# Patient Record
Sex: Male | Born: 1975
Health system: Southern US, Community
[De-identification: ages and names within clinical notes are randomized; demographics above are authoritative.]

## PROBLEM LIST (undated history)

## (undated) DIAGNOSIS — G4733 Obstructive sleep apnea (adult) (pediatric): Secondary | ICD-10-CM

## (undated) DIAGNOSIS — J45990 Exercise induced bronchospasm: Secondary | ICD-10-CM

## (undated) DIAGNOSIS — K219 Gastro-esophageal reflux disease without esophagitis: Secondary | ICD-10-CM

## (undated) DIAGNOSIS — B2790 Infectious mononucleosis, unspecified without complication: Secondary | ICD-10-CM

## (undated) DIAGNOSIS — G8929 Other chronic pain: Secondary | ICD-10-CM

## (undated) DIAGNOSIS — G4730 Sleep apnea, unspecified: Secondary | ICD-10-CM

## (undated) DIAGNOSIS — J4540 Moderate persistent asthma, uncomplicated: Secondary | ICD-10-CM

## (undated) DIAGNOSIS — M549 Dorsalgia, unspecified: Secondary | ICD-10-CM

## (undated) DIAGNOSIS — J309 Allergic rhinitis, unspecified: Secondary | ICD-10-CM

## (undated) HISTORY — DX: Allergic rhinitis, unspecified: J30.9

## (undated) HISTORY — DX: Obstructive sleep apnea (adult) (pediatric): G47.33

## (undated) HISTORY — DX: Sleep apnea, unspecified: G47.30

## (undated) HISTORY — DX: Moderate persistent asthma, uncomplicated: J45.40

## (undated) HISTORY — DX: Dorsalgia, unspecified: M54.9

## (undated) HISTORY — DX: Other chronic pain: G89.29

## (undated) HISTORY — DX: Exercise induced bronchospasm: J45.990

## (undated) HISTORY — DX: Gastro-esophageal reflux disease without esophagitis: K21.9

## (undated) HISTORY — DX: Infectious mononucleosis, unspecified without complication: B27.90

---

## 2005-08-05 ENCOUNTER — Ambulatory Visit: Payer: Self-pay | Admitting: Internal Medicine

## 2005-08-16 ENCOUNTER — Ambulatory Visit: Payer: Self-pay | Admitting: Family Medicine

## 2005-08-29 ENCOUNTER — Ambulatory Visit: Payer: Self-pay | Admitting: Family Medicine

## 2006-08-07 ENCOUNTER — Ambulatory Visit: Payer: Self-pay | Admitting: Family Medicine

## 2006-10-16 ENCOUNTER — Ambulatory Visit: Payer: Self-pay | Admitting: Family Medicine

## 2007-06-12 ENCOUNTER — Ambulatory Visit: Payer: Self-pay | Admitting: Family Medicine

## 2007-12-13 ENCOUNTER — Ambulatory Visit: Payer: Self-pay | Admitting: Family Medicine

## 2007-12-19 ENCOUNTER — Telehealth (INDEPENDENT_AMBULATORY_CARE_PROVIDER_SITE_OTHER): Payer: Self-pay | Admitting: Internal Medicine

## 2008-01-25 ENCOUNTER — Ambulatory Visit: Payer: Self-pay | Admitting: Family Medicine

## 2008-02-19 ENCOUNTER — Emergency Department: Payer: Self-pay | Admitting: Unknown Physician Specialty

## 2008-02-21 ENCOUNTER — Ambulatory Visit: Payer: Self-pay | Admitting: Family Medicine

## 2008-02-25 ENCOUNTER — Ambulatory Visit: Payer: Self-pay | Admitting: Family Medicine

## 2008-03-05 ENCOUNTER — Ambulatory Visit: Payer: Self-pay | Admitting: Family Medicine

## 2008-03-13 ENCOUNTER — Ambulatory Visit: Payer: Self-pay | Admitting: Internal Medicine

## 2008-03-13 DIAGNOSIS — R1011 Right upper quadrant pain: Secondary | ICD-10-CM | POA: Insufficient documentation

## 2008-03-13 DIAGNOSIS — R519 Headache, unspecified: Secondary | ICD-10-CM | POA: Insufficient documentation

## 2008-03-13 DIAGNOSIS — R51 Headache: Secondary | ICD-10-CM | POA: Insufficient documentation

## 2008-03-13 LAB — CONVERTED CEMR LAB
ALT: 30 units/L (ref 0–53)
AST: 25 units/L (ref 0–37)
Alkaline Phosphatase: 75 units/L (ref 39–117)
Bilirubin, Direct: 0.1 mg/dL (ref 0.0–0.3)
CO2: 29 meq/L (ref 19–32)
Calcium: 9.3 mg/dL (ref 8.4–10.5)
Chloride: 102 meq/L (ref 96–112)
Potassium: 3.2 meq/L — ABNORMAL LOW (ref 3.5–5.1)
Sodium: 140 meq/L (ref 135–145)
Total Bilirubin: 1.1 mg/dL (ref 0.3–1.2)

## 2008-03-14 ENCOUNTER — Telehealth: Payer: Self-pay | Admitting: Family Medicine

## 2008-03-14 ENCOUNTER — Ambulatory Visit: Payer: Self-pay | Admitting: Family Medicine

## 2008-03-14 DIAGNOSIS — E739 Lactose intolerance, unspecified: Secondary | ICD-10-CM | POA: Insufficient documentation

## 2008-03-14 LAB — CONVERTED CEMR LAB: Blood Glucose, Fingerstick: 88

## 2008-03-18 ENCOUNTER — Telehealth: Payer: Self-pay | Admitting: Family Medicine

## 2008-03-18 ENCOUNTER — Ambulatory Visit: Payer: Self-pay | Admitting: Family Medicine

## 2008-03-20 LAB — CONVERTED CEMR LAB
CO2: 30 meq/L (ref 19–32)
Chloride: 103 meq/L (ref 96–112)
Creatinine, Ser: 1.2 mg/dL (ref 0.4–1.5)
Glucose, Bld: 94 mg/dL (ref 70–99)
Magnesium: 2.4 mg/dL (ref 1.5–2.5)

## 2008-04-08 ENCOUNTER — Ambulatory Visit: Payer: Self-pay | Admitting: Family Medicine

## 2008-04-08 DIAGNOSIS — J309 Allergic rhinitis, unspecified: Secondary | ICD-10-CM | POA: Insufficient documentation

## 2008-06-10 ENCOUNTER — Ambulatory Visit: Payer: Self-pay | Admitting: Family Medicine

## 2008-06-10 ENCOUNTER — Encounter (INDEPENDENT_AMBULATORY_CARE_PROVIDER_SITE_OTHER): Payer: Self-pay | Admitting: *Deleted

## 2008-06-10 DIAGNOSIS — M545 Low back pain, unspecified: Secondary | ICD-10-CM | POA: Insufficient documentation

## 2008-08-05 ENCOUNTER — Ambulatory Visit: Payer: Self-pay | Admitting: Family Medicine

## 2008-08-05 ENCOUNTER — Encounter (INDEPENDENT_AMBULATORY_CARE_PROVIDER_SITE_OTHER): Payer: Self-pay | Admitting: *Deleted

## 2008-08-05 DIAGNOSIS — K5289 Other specified noninfective gastroenteritis and colitis: Secondary | ICD-10-CM | POA: Insufficient documentation

## 2008-10-16 ENCOUNTER — Ambulatory Visit: Payer: Self-pay | Admitting: Family Medicine

## 2008-12-31 ENCOUNTER — Ambulatory Visit: Payer: Self-pay | Admitting: Family Medicine

## 2009-01-02 ENCOUNTER — Encounter (INDEPENDENT_AMBULATORY_CARE_PROVIDER_SITE_OTHER): Payer: Self-pay | Admitting: Internal Medicine

## 2009-01-02 ENCOUNTER — Ambulatory Visit: Payer: Self-pay | Admitting: Internal Medicine

## 2009-03-31 ENCOUNTER — Ambulatory Visit: Payer: Self-pay | Admitting: Family Medicine

## 2009-04-16 ENCOUNTER — Ambulatory Visit: Payer: Self-pay | Admitting: Family Medicine

## 2009-04-16 DIAGNOSIS — M542 Cervicalgia: Secondary | ICD-10-CM | POA: Insufficient documentation

## 2009-07-10 ENCOUNTER — Encounter (INDEPENDENT_AMBULATORY_CARE_PROVIDER_SITE_OTHER): Payer: Self-pay | Admitting: *Deleted

## 2009-07-10 ENCOUNTER — Ambulatory Visit: Payer: Self-pay | Admitting: Family Medicine

## 2009-07-10 DIAGNOSIS — K219 Gastro-esophageal reflux disease without esophagitis: Secondary | ICD-10-CM | POA: Insufficient documentation

## 2009-07-15 ENCOUNTER — Ambulatory Visit: Payer: Self-pay | Admitting: Family Medicine

## 2009-08-10 ENCOUNTER — Ambulatory Visit: Payer: Self-pay | Admitting: Gastroenterology

## 2009-08-11 ENCOUNTER — Ambulatory Visit: Payer: Self-pay | Admitting: Family Medicine

## 2009-08-26 ENCOUNTER — Ambulatory Visit: Payer: Self-pay | Admitting: Gastroenterology

## 2009-09-21 ENCOUNTER — Telehealth: Payer: Self-pay | Admitting: Family Medicine

## 2009-09-25 ENCOUNTER — Telehealth (INDEPENDENT_AMBULATORY_CARE_PROVIDER_SITE_OTHER): Payer: Self-pay

## 2009-09-28 ENCOUNTER — Ambulatory Visit: Payer: Self-pay | Admitting: Family Medicine

## 2010-02-02 ENCOUNTER — Ambulatory Visit: Payer: Self-pay | Admitting: Family Medicine

## 2010-03-26 ENCOUNTER — Telehealth: Payer: Self-pay | Admitting: Family Medicine

## 2010-07-30 ENCOUNTER — Ambulatory Visit: Payer: Self-pay | Admitting: Internal Medicine

## 2010-07-30 ENCOUNTER — Encounter: Payer: Self-pay | Admitting: Family Medicine

## 2010-08-04 ENCOUNTER — Telehealth: Payer: Self-pay | Admitting: Family Medicine

## 2010-08-27 ENCOUNTER — Ambulatory Visit
Admission: RE | Admit: 2010-08-27 | Discharge: 2010-08-27 | Payer: Self-pay | Source: Home / Self Care | Attending: Family Medicine | Admitting: Family Medicine

## 2010-08-27 ENCOUNTER — Encounter (INDEPENDENT_AMBULATORY_CARE_PROVIDER_SITE_OTHER): Payer: Self-pay | Admitting: *Deleted

## 2010-08-27 DIAGNOSIS — J069 Acute upper respiratory infection, unspecified: Secondary | ICD-10-CM | POA: Insufficient documentation

## 2010-08-27 LAB — CONVERTED CEMR LAB
Bilirubin Urine: NEGATIVE
Ketones, urine, test strip: NEGATIVE
Specific Gravity, Urine: 1.02
Urobilinogen, UA: 0.2

## 2010-09-21 NOTE — Progress Notes (Signed)
Summary: problems urinating  Phone Note Call from Patient Call back at [phone]   Caller: Patient Call For: Dr Dayton Martes Summary of Call: For last 3 days patient has constant feeling to urinate. Pt has voided normal light yellow amt of urine but in between dribbles dark yellow urine in small amts.Pt has seen no blood. Pt has dull pain (pain level 2) in lower back on and off. Pt is eating and drinking normally but has had some nausea today. No fever and no burning or pain when urinates.Pt made appt with Dr Patsy Lager on 09/28/09 at 8:00am. If changes or condition worsens pt will call back and was given info about [facility]. Initial call taken by: Lewanda Rife LPN,  September 25, 2009 12:26 PM

## 2010-09-21 NOTE — Assessment & Plan Note (Signed)
Summary: CONSTANT FEEL LIKE URINATE,BACK PAIN/RI   Vital Signs:  Patient profile:   35 year old male Temp:     97.7 degrees F oral Pulse rate:   76 / minute Pulse rhythm:   regular BP sitting:   110 / 70  (left arm) Cuff size:   regular  Vitals Entered By: Linde Gillis CMA [name]) (September 28, 2009 8:13 AM) CC: ? uti   History of Present Illness:  35 year old male:  Has been urinating a lot. Thursday and friday of last week   Earlier felt that he may have passed a kidney stone. A whole lot of it. Feels like still has to pee.  Some pain in his low back   sun - mon last week   phone notes reviewed  was having pain in back mid last week, feels like he prob passed a stone, now with urgency. and some dysuria.   Allergies: 1)  ! Nexium 2)  ! * Doxycycline 3)  ! Percocet (Oxycodone-Acetaminophen)  Past History:  Past medical, surgical, family and social histories (including risk factors) reviewed, and no changes noted (except as noted below).  Past Medical History: Reviewed history from 08/10/2009 and no changes required. GERD MONO IN HIGH SCHOOL ? EXERCISED INDUCED ASTHMA BACK PAIN; CHRONIC:(DR. MARK YATES)  Past Surgical History: Reviewed history from 08/10/2009 and no changes required. none  Family History: Reviewed history from 08/10/2009 and no changes required. diabetes, heart disease  Social History: Reviewed history from 08/10/2009 and no changes required. Marital Status: Married LIFES WITH WIFE Children: 2 children works at [employer] in the [employer] section Drinks one caffeinated beverage a day, does not smoke cigarettes, does not drink alcohol  Review of Systems       REVIEW OF SYSTEMS GEN: Acute illness details above. CV: No chest pain or SOB GI: has had some n, v Otherwise, pertinent positives and negatives are noted in the HPI.   Physical Exam  Additional Exam:  GEN: WDWN, A&Ox4,NAD. Non-toxic HEENT: Atraumatic, normocephalic. CV:  RRR, No M/G/R PULM: CTA B, No wheezes, crackles, or rhonchi ABD: S, NT, ND, +BS, no rebound. No CVAT. + suprapubic tenderness. Pain in lower back EXT: No c/c/e    Impression & Recommendations:  Problem # 1:  UTI (ICD-599.0) Assessment New  complex uti with prob prior kidney stone treat with abx  if no improvement, worsening pain, would get renal protocol ct to eval  His updated medication list for this problem includes:    Cipro 250 Mg Tabs (Ciprofloxacin hcl) .Marland Kitchen... 1 by mouth 2 times daily  Encouraged to push clear liquids, get enough rest, and take acetaminophen as needed. To be seen in 10 days if no improvement, sooner if worse.  Orders: UA Dipstick w/o Micro (manual) ([address])  Complete Medication List: 1)  Protonix 40 Mg Pack (Pantoprazole sodium) .... Take 1 tablet by mouth bid 2)  Vitamin C 500 Mg Tabs (Ascorbic acid) .... Takes two daily 3)  Multivitamins Tabs (Multiple vitamin) .... Take 1 by mouth once daily 4)  Tylenol 325 Mg Tabs (Acetaminophen) .... As needed 5)  Cipro 250 Mg Tabs (Ciprofloxacin hcl) .Marland Kitchen.. 1 by mouth 2 times daily Prescriptions: CIPRO 250 MG TABS (CIPROFLOXACIN HCL) 1 by mouth 2 times daily  #14 x 0   Entered and Authorized by:   Hannah Beat MD   Signed by:   Hannah Beat MD on 09/28/2009   Method used:   Electronically to  [facility]* (retail)       [address]       [place], [place]  [address]       Ph: [phone]       Fax: [phone]   RxID:   [id number]   Current Allergies (reviewed today): ! NEXIUM ! * DOXYCYCLINE ! PERCOCET (OXYCODONE-ACETAMINOPHEN)  Appended Document: CONSTANT FEEL LIKE URINATE,BACK PAIN/RI      Laboratory Results   Urine Tests   Date/Time Reported: September 28, 2009 8:36 AM   Routine Urinalysis   Glucose: negative   (Normal Range: Negative) Bilirubin: small   (Normal Range: Negative) Ketone: negative   (Normal Range: Negative) Spec. Gravity: >=1.030   (Normal Range:  1.003-1.035) Blood: trace-lysed   (Normal Range: Negative) pH: 5.0   (Normal Range: 5.0-8.0) Protein: trace   (Normal Range: Negative) Urobilinogen: 0.2   (Normal Range: 0-1) Nitrite: negative   (Normal Range: Negative) Leukocyte Esterace: negative   (Normal Range: Negative)

## 2010-09-21 NOTE — Procedures (Signed)
Summary: Upper Endoscopy  Patient: [name] Note: All result statuses are Final unless otherwise noted.  Tests: (1) Upper Endoscopy (EGD)   EGD Upper Endoscopy       DONE     [facility]     [address]     [place], [place]  [address]           ENDOSCOPY PROCEDURE REPORT           PATIENT:  Antonio Lara, Antonio Lara  MR#:  [id number]     BIRTHDATE:  1976/05/22, 33 yrs. old  GENDER:  male           ENDOSCOPIST:  Rachael Fee, MD     Referred by:  Ruthe Mannan, M.D.           PROCEDURE DATE:  08/26/2009     PROCEDURE:  EGD, diagnostic     ASA CLASS:  Class I     INDICATIONS:  GERD symptoms           MEDICATIONS:  Fentanyl 75 mcg IV, Versed 7 mg IV     TOPICAL ANESTHETIC:  none           DESCRIPTION OF PROCEDURE:   After the risks benefits and     alternatives of the procedure were thoroughly explained, informed     consent was obtained.  The LB [id number]-H180 [id number] endoscope was     introduced through the mouth and advanced to the second portion of     the duodenum, without limitations.  The instrument was slowly     withdrawn as the mucosa was fully examined.     <<PROCEDUREIMAGES>>           The upper, middle, and distal third of the esophagus were     carefully inspected and no abnormalities were noted. The z-line     was well seen at the GEJ. The endoscope was pushed into the fundus     which was normal including a retroflexed view. The antrum,gastric     body, first and second part of the duodenum were unremarkable (see     image1, image2, image3, image4, image5, and image7).     Retroflexed views revealed no abnormalities.    The scope was then     withdrawn from the patient and the procedure completed.           COMPLICATIONS:  None           ENDOSCOPIC IMPRESSION:     Normal EGD.  His symptoms are from Non-Erosive reflux disease     and seem to be under good control as long as he takes twice daily     PPI (protonix).           RECOMMENDATIONS:  Continue twice daily PPI (best to take it 20-30 min prior to     breakfast and dinner meals).  Try cutting back to once a day in     1-2 months.  Return to see Dr. Christella Hartigan as needed, call if symptoms     worsen despite twice daily dosing.           ______________________________     Rachael Fee, MD           n.     eSIGNED:   Rachael Fee at 08/26/2009 10:04 AM           Darrall Dears, [id number]  Note: An exclamation mark (!) indicates a result that was  not dispersed into the flowsheet. Document Creation Date: 08/26/2009 10:03 AM _______________________________________________________________________  (1) Order result status: Final Collection or observation date-time: 08/26/2009 09:56 Requested date-time:  Receipt date-time:  Reported date-time:  Referring Physician:   Ordering Physician: Rob Bunting [phone]) Specimen Source:  Source: Launa Grill Order Number: [id number] Lab site:

## 2010-09-21 NOTE — Assessment & Plan Note (Signed)
Summary: VOMITING/DIZZY/SORENESS/DLO   Vital Signs:  Patient profile:   35 year old male Weight:      186.75 pounds Temp:     99.9 degrees F oral Pulse rate:   92 / minute Pulse rhythm:   regular BP sitting:   100 / 60  (right arm) Cuff size:   regular  Vitals Entered By: Sydell Axon LPN (February 02, 2010 11:05 AM) CC: Ears feel clogged up, vomitting, dizziness, soreness all over and sweating a lot   History of Present Illness: Pt here for clogging of the ears started 11/2 sweeks ago, soreness 4-5 days ago, sweating a few days ago, vomiting two days ago...vomitted twice, none again until this AM. Has been tired but went to work last night. Came hoime and fell asleep on the couch. He has had sweating, unknown fever, fleeting chills, he denies headache except with coughing, no ear pain but congested, rhinitis "multicolored" and mild ST, cough also productive, also SOB, very sore muscles all over, some N/V as above. Has taken IBP for soreness.  Problems Prior to Update: 1)  Uti  (ICD-599.0) 2)  Gerd  (ICD-530.81) 3)  Back Pain  (ICD-724.5) 4)  Neck Pain, Acute  (ICD-723.1) 5)  Testicular Pain, Left >right But Bilat  (ICD-608.9) 6)  Gastroenteritis  (ICD-558.9) 7)  Back Pain, Lumbar  (ICD-724.2) 8)  Allergic Rhinitis  (ICD-477.9) 9)  Glucose Intolerance  (ICD-271.3) 10)  Ruq Pain  (ICD-789.01) 11)  Headache  (ICD-784.0)  Medications Prior to Update: 1)  Protonix 40 Mg  Pack (Pantoprazole Sodium) .... Take 1 Tablet By Mouth Bid 2)  Vitamin C 500 Mg  Tabs (Ascorbic Acid) .... Takes Two Daily 3)  Multivitamins  Tabs (Multiple Vitamin) .... Take 1 By Mouth Once Daily 4)  Tylenol 325 Mg Tabs (Acetaminophen) .... As Needed  Allergies: 1)  ! Nexium 2)  ! * Doxycycline 3)  ! Percocet (Oxycodone-Acetaminophen)  Physical Exam  General:  Well-developed,well-nourished,in no acute distress; alert,appropriate and cooperative throughout examination, nontoxic and minimally congested. Head:   Normocephalic and atraumatic without obvious abnormalities. No apparent alopecia or balding. Sinuses NT. Eyes:  Conjunctiva clear bilaterally.  Ears:  External ear exam shows no significant lesions or deformities.  Otoscopic examination reveals clear canals, tympanic membranes are intact bilaterally without bulging, retraction, inflammation or discharge. Hearing is grossly normal bilaterally. Nose:  External nasal examination shows no deformity or inflammation. Nasal mucosa are pink and moist without lesions or exudates, right nare mildly inflamed and swollen.. Mouth:  Oral mucosa and oropharynx without lesions or exudates.  Teeth in good repair. Neck:  No deformities, masses, or tenderness noted. Lungs:  Normal respiratory effort, chest expands symmetrically. Lungs are clear to auscultation, no crackles or wheezes. Heart:  Normal rate and regular rhythm. S1 and S2 normal without gallop, murmur, click, rub or other extra sounds. Mildly orthostatic with getting up. Abdomen:  Bowel sounds positive,abdomen soft and non-tender without masses, organomegaly or hernias noted. Tympanitic in the right side, no rebound or referred.   Impression & Recommendations:  Problem # 1:  URI (ICD-465.9) Assessment New  See instructions.  Note for work, go back Fri. His updated medication list for this problem includes:    Tylenol 325 Mg Tabs (Acetaminophen) .Marland Kitchen... As needed    Advil 200 Mg Tabs (Ibuprofen) .Marland Kitchen... As needed    Promethazine Hcl 25 Mg Tabs (Promethazine hcl) ..... One tab by mouth every 6 hrs as needed nausea  Instructed on symptomatic treatment. Call if symptoms  persist or worsen.   Complete Medication List: 1)  Protonix 40 Mg Pack (Pantoprazole sodium) .... Take 1 tablet by mouth two times a day 2)  Vitamin C 500 Mg Tabs (Ascorbic acid) .... Takes two daily 3)  Multivitamins Tabs (Multiple vitamin) .... Take 1 by mouth once daily 4)  Tylenol 325 Mg Tabs (Acetaminophen) .... As needed 5)   Advil 200 Mg Tabs (Ibuprofen) .... As needed 6)  Promethazine Hcl 25 Mg Tabs (Promethazine hcl) .... One tab by mouth every 6 hrs as needed nausea  Patient Instructions: 1)  Take Guaifenesin by [facility] or [facility] and getting MUCOUS RELIEF EXPECTORANT (400mg ), take 11/2 tabs by mouth AM and NOON. 2)  Drink lots of fluids anytime taking Guaifenesin.  3)  Take Tyl ES 2 tabs by mouth three times a day. 4)  Take Phenergan as needed for nausea. Prescriptions: PROMETHAZINE HCL 25 MG TABS (PROMETHAZINE HCL) one tab by mouth every 6 hrs as needed nausea  #10 x 0   Entered and Authorized by:   Shaune Leeks MD   Signed by:   Shaune Leeks MD on 02/02/2010   Method used:   Electronically to        [facility]* (retail)       [address]       [place], [place]  [address]       Ph: [phone]       Fax: [phone]   RxID:   [id number]   Current Allergies (reviewed today): ! NEXIUM ! * DOXYCYCLINE ! PERCOCET (OXYCODONE-ACETAMINOPHEN)

## 2010-09-21 NOTE — Letter (Signed)
Summary: Out of Work  [facility]  [address] [place], [place] [address]   Phone: [phone]  Fax: [phone]    February 02, 2010   Employee:  Antonio Lara    To Whom It May Concern:   For Medical reasons, please excuse the above named employee from work for the following dates:  Start:   02/02/2010  End:   618/2011  If you need additional information, please feel free to contact our office.         Sincerely,    Shaune Leeks MD

## 2010-09-21 NOTE — Progress Notes (Signed)
Summary: prior Berkley Harvey is needed for pantoprazole  Phone Note From Pharmacy   Caller: [facility] Summary of Call: Prior Berkley Harvey is needed for pantoprazole, form is on your desk. Initial call taken by: Lowella Petties CMA,  March 26, 2010 11:59 AM  Follow-up for Phone Call        will complete Follow-up by: Hannah Beat MD,  March 26, 2010 2:27 PM

## 2010-09-21 NOTE — Assessment & Plan Note (Signed)
Summary: ?SINUS INFECTION,TETENUS SHOT/CLE   Vital Signs:  Patient profile:   35 year old male Height:      72 inches Weight:      190 pounds BMI:     25.86 Temp:     98.4 degrees F oral Pulse rate:   80 / minute Pulse rhythm:   regular BP sitting:   130 / 80  (left arm) Cuff size:   regular  Vitals Entered By: Providence Crosby LPN (March 31, 2009 10:32 AM) CC: ? sinus infection//needs dtap, Hypertension Management   History of Present Illness: Pt here for thinking he had a sinus infection. He had green congestion until yesterday, no energy, headache and discharge. He has had no fever or chills, mild ear pain both ears, min headache, rhinitis on and off, more nasal congestion, minimal ST 4 days ago with initial presentation, no cough, mild SOB, no V, mild nausea.   He has taken one Loratidine.  Hypertension History:      Negative major cardiovascular risk factors include male age less than 43 years old and non-tobacco-user status.    Problems Prior to Update: 1)  Testicular Pain, Left >right But Bilat  (ICD-608.9) 2)  Gastroenteritis  (ICD-558.9) 3)  Back Pain, Lumbar  (ICD-724.2) 4)  Allergic Rhinitis  (ICD-477.9) 5)  Glucose Intolerance  (ICD-271.3) 6)  Ruq Pain  (ICD-789.01) 7)  Headache  (ICD-784.0)  Medications Prior to Update: 1)  Protonix 40 Mg  Pack (Pantoprazole Sodium) .... Take 1 Tablet By Mouth Once A Day 2)  Multivitamins   Tabs (Multiple Vitamin) .... One Daily 3)  Mucinex 600 Mg Xr12h-Tab (Guaifenesin) .... Unsure of Mg But Otc and Can Take Every Four Hours As Needed 4)  Vitamin C 500 Mg  Tabs (Ascorbic Acid) .... Takes Two Daily  Allergies: 1)  ! Nexium 2)  ! * Doxycycline 3)  ! Percocet (Oxycodone-Acetaminophen)  Physical Exam  General:  alert, well-developed, well-nourished, and well-hydrated, comfortable when seen. Head:  Normocephalic and atraumatic without obvious abnormalities. No apparent alopecia or balding. Sinuses NT. Eyes:  Conjunctiva clear  bilaterally.  Ears:  External ear exam shows no significant lesions or deformities.  Otoscopic examination reveals clear canals, tympanic membranes are intact bilaterally without bulging, retraction, inflammation or discharge. Hearing is grossly normal bilaterally. Nose:  External nasal examination shows no deformity or inflammation. Nasal mucosa are pink and moist without lesions or exudates. Mouth:  Oral mucosa and oropharynx without lesions or exudates.  Teeth in good repair. Neck:  No deformities, masses, or tenderness noted. Lungs:  Normal respiratory effort, chest expands symmetrically. Lungs are clear to auscultation, no crackles or wheezes. Heart:  Normal rate and regular rhythm. S1 and S2 normal without gallop, murmur, click, rub or other extra sounds.   Impression & Recommendations:  Problem # 1:  URI (ICD-465.9) Assessment New See instructions. The following medications were removed from the medication list:    Mucinex 600 Mg Xr12h-tab (Guaifenesin) ..... Unsure of mg but otc and can take every four hours as needed His updated medication list for this problem includes:    Loratadine 10 Mg Tabs (Loratadine) .Marland Kitchen... 1 tablet as needed by mouth  Complete Medication List: 1)  Protonix 40 Mg Pack (Pantoprazole sodium) .... Take 1 tablet by mouth once a day 2)  Multivitamins Tabs (multiple Vitamin)with Lycopene  .... One dailyby mouth 3)  Vitamin C 500 Mg Tabs (Ascorbic acid) .... Takes two daily 4)  Loratadine 10 Mg Tabs (Loratadine) .Marland KitchenMarland KitchenMarland Kitchen 1  tablet as needed by mouth  Hypertension Assessment/Plan:      The patient's hypertensive risk group is category A: No risk factors and no target organ damage.  Today's blood pressure is 130/80.      Patient Instructions: 1)  Take Guaifenesin by [facility] or [facility] and getting MUCOUS RELIEF EXPECTORANT (400mg ), take 11/2 tabs by mouth AM and NOON. 2)  Drink lots of fluids anytime taking Guaifenesin.  3)  Take  Aleeve 2 after brfst and Supper.  4)  If ST, gargle with warm salt water every 1/2 hr for 48 hrs when awake.  Appended Document: ?SINUS INFECTION,TETENUS SHOT/CLE     Allergies: 1)  ! Nexium 2)  ! * Doxycycline 3)  ! Percocet (Oxycodone-Acetaminophen)   Complete Medication List: 1)  Protonix 40 Mg Pack (Pantoprazole sodium) .... Take 1 tablet by mouth once a day 2)  Multivitamins Tabs (multiple Vitamin)with Lycopene  .... One dailyby mouth 3)  Vitamin C 500 Mg Tabs (Ascorbic acid) .... Takes two daily 4)  Loratadine 10 Mg Tabs (Loratadine) .Marland Kitchen.. 1 tablet as needed by mouth  Other Orders: Tdap => 26yrs IM [phone]) Admin 1st Vaccine ([address]) Admin 1st Vaccine [facility]) [id number])    Tetanus/Td Vaccine    Vaccine Type: Tdap    Site: left deltoid    Mfr: GlaxoSmithKline    Dose: 0.5 ml    Route: IM    Given by: Providence Crosby LPN    Exp. Date: 06/25/2011    Lot #: [id number]    VIS given: 07/10/07 version given March 31, 2009.

## 2010-09-21 NOTE — Progress Notes (Signed)
Summary: left back pain  Phone Note Call from Patient Call back at [phone]   Caller: Patient Call For: Dr. Dayton Martes Summary of Call: Pt has no hx of kidney stone but 4 days ago had diarrhea for 3 days. Last night had vomiting. On Sat,09/19/09 had left sided back pain pain level is 8 with blood in urine x 2. Today no diarrhea or vomiting and no blood in urine. Pt still has left sided back pain Pain level now is 3 now and urine looks concentrated and cloudy with slight odor. Pt said not voiding as often now as usual and voiding smaller amts. No pain upon urination. Pt uses [facility] [phone]. Pt has drank water today with no vomiting. Pt is at work. Please advise.  Initial call taken by: Lewanda Rife LPN,  September 21, 2009 12:46 PM  Follow-up for Phone Call        Likely passed a stone.  Ok to continue drinking water, needs to be seen if pain intensifies. Follow-up by: Ruthe Mannan MD,  September 21, 2009 1:29 PM  Additional Follow-up for Phone Call Additional follow up Details #1::        Patient notified as instructed by telephone. Lewanda Rife LPN  September 21, 2009 2:20 PM

## 2010-09-21 NOTE — Assessment & Plan Note (Signed)
Summary: DIZZY,EARS,COUGH/CLE   Vital Signs:  Patient profile:   35 year old male Height:      72 inches Weight:      189.75 pounds BMI:     25.83 Temp:     98.5 degrees F oral Pulse rate:   84 / minute Pulse rhythm:   regular BP sitting:   106 / 78  (left arm) Cuff size:   large  Vitals Entered By: Delilah Shan CMA [name]) (July 30, 2010 12:18 PM) CC: Dizzy, ears hurt, cough   History of Present Illness: CC: dizzy?  5d sinus congestion.  PNDrip.  Mucous light/dark yellow.  + nasal congestion.  + tender palate.  + some sinus pressure/swelling.  + increased gassiness.  h/o reflux, worsening with feeling like throat closing.  Peptobismol helped some.  + ear pain and tinnitus.  + more muffled hearing.  breathe strip helped.  This am ST.  Poor sleeping.  + cough in last 1-2 days, yellow drainage.  Tussin D helped.  Having episodes where he feels imbalanced, happen randomly.  like change in perception or like he's "high" when he's not.  No vertigo or presyncope.    No abd pain, n/v/d, rashes, myalgias.  No fevers/chills.  No HA.  + sick contacts at work.  + both children somewhat sick.  No smokers at home.    Notices worse congestion with new christmas tree.  Current Medications (verified): 1)  Protonix 40 Mg  Pack (Pantoprazole Sodium) .... Take 1 Tablet By Mouth Two Times A Day 2)  Vitamin C 500 Mg  Tabs (Ascorbic Acid) .... Takes Two Daily 3)  Multivitamins  Tabs (Multiple Vitamin) .... Take 1 By Mouth Once Daily 4)  Tylenol 325 Mg Tabs (Acetaminophen) .... As Needed 5)  Advil 200 Mg Tabs (Ibuprofen) .... As Needed 6)  Glucosamine-Chondroitin   Caps (Glucosamine-Chondroit-Vit C-Mn) .... Once Daily 7)  Mucinex 600 Mg Xr12h-Tab (Guaifenesin) .... Takes 1-1/2 Tablets Once Daily  Allergies: 1)  ! Nexium 2)  ! * Doxycycline 3)  ! Percocet (Oxycodone-Acetaminophen)  Past History:  Past Medical History: Last updated: 08/10/2009 GERD MONO IN HIGH SCHOOL ? EXERCISED  INDUCED ASTHMA BACK PAIN; CHRONIC:(DR. MARK YATES)  Social History: Last updated: 08/10/2009 Marital Status: Married LIFES WITH WIFE Children: 2 children works at [employer] in the [employer] section Drinks one caffeinated beverage a day, does not smoke cigarettes, does not drink alcohol  Review of Systems       per HPI  Physical Exam  General:  Well-developed,well-nourished,in no acute distress; alert,appropriate and cooperative throughout examination, nontoxic and minimally congested. Head:  Normocephalic and atraumatic without obvious abnormalities. No apparent alopecia or balding. L frontal sinus slightly tender Eyes:  Conjunctiva clear bilaterally.  Ears:  clear canals, TMs clear bilaterally, some congestion behind L ear Nose:  nares mildly inflamed, swollen Mouth:  Oral mucosa and oropharynx without lesions or exudates.  Teeth in good repair.  + some PNDrip Neck:  No deformities, masses, or tenderness noted. Lungs:  Normal respiratory effort, chest expands symmetrically. Lungs are clear to auscultation, no crackles or wheezes. Heart:  Normal rate and regular rhythm. S1 and S2 normal without gallop, murmur, click, rub or other extra sounds. Mildly orthostatic with getting up. Pulses:  2+ rad pulses Extremities:  no pedal edema   Impression & Recommendations:  Problem # 1:  URI (ICD-465.9) Assessment New  vs early sinusitis given associated sxs.  Instructed on symptomatic treatment. Call if symptoms persist or  worsen.   to call next week if not better for consideration of amox course.  The following medications were removed from the medication list:    Promethazine Hcl 25 Mg Tabs (Promethazine hcl) ..... One tab by mouth every 6 hrs as needed nausea His updated medication list for this problem includes:    Tylenol 325 Mg Tabs (Acetaminophen) .Marland Kitchen... As needed    Advil 200 Mg Tabs (Ibuprofen) .Marland Kitchen... As needed    Mucinex 600 Mg Xr12h-tab (Guaifenesin) .Marland Kitchen... Takes 1-1/2  tablets once daily    Cheratussin Ac 100-10 Mg/24ml Syrp (Guaifenesin-codeine) .Marland Kitchen... Take one teaspoon at bedtime as needed cough  Complete Medication List: 1)  Protonix 40 Mg Pack (Pantoprazole sodium) .... Take 1 tablet by mouth two times a day 2)  Vitamin C 500 Mg Tabs (Ascorbic acid) .... Takes two daily 3)  Multivitamins Tabs (Multiple vitamin) .... Take 1 by mouth once daily 4)  Tylenol 325 Mg Tabs (Acetaminophen) .... As needed 5)  Advil 200 Mg Tabs (Ibuprofen) .... As needed 6)  Glucosamine-chondroitin Caps (Glucosamine-chondroit-vit c-mn) .... Once daily 7)  Mucinex 600 Mg Xr12h-tab (Guaifenesin) .... Takes 1-1/2 tablets once daily 8)  Cheratussin Ac 100-10 Mg/57ml Syrp (Guaifenesin-codeine) .... Take one teaspoon at bedtime as needed cough  Patient Instructions: 1)  you have upper respiratory infection, could be early sinusitis. 2)  Take medicines as prescribed: cough syrup with codeine 3)  Take guaifenesin 400mg  IR 1 1/2 pills in am and at noon with plenty of fluid to help mobilize mucous.  (or mucinex with fluid) 4)  Use nasal saline spray or neti pot to help drainage of sinuses. 5)  If you start having fevers >101.5, trouble swallowing or breathing, or are worsening instead of improving as expected, you may need to be seen again. 6)  Good to see you today, call clinic with questions.  Prescriptions: CHERATUSSIN AC 100-10 MG/5ML SYRP (GUAIFENESIN-CODEINE) take one teaspoon at bedtime as needed cough  #100cc x 0   Entered and Authorized by:   Eustaquio Boyden  MD   Signed by:   Eustaquio Boyden  MD on 07/30/2010   Method used:   Print then Give to Patient   RxID:   [id number]    Orders Added: 1)  Est. Patient Level III [95621]    Current Allergies (reviewed today): ! NEXIUM ! * DOXYCYCLINE ! PERCOCET (OXYCODONE-ACETAMINOPHEN)

## 2010-09-21 NOTE — Letter (Signed)
Summary: Out of Work  [facility]  [address] [place], [place] [address]   Phone: [phone]  Fax: [phone]    July 30, 2010   Employee:  [facility] [employer]    To Whom It May Concern:   For Medical reasons, please excuse the above named employee from work for the following dates:  Start:  July 30, 2010   End:  July 30, 2010   If you need additional information, please feel free to contact our office.         Sincerely,    Eustaquio Boyden  MD

## 2010-09-23 NOTE — Assessment & Plan Note (Signed)
Summary: COUGH,BACK PAIN/CLE   Vital Signs:  Patient profile:   35 year old male Weight:      192.25 pounds Temp:     98.6 degrees F oral Pulse rate:   84 / minute Pulse rhythm:   regular BP sitting:   110 / 70  (left arm) Cuff size:   large  Vitals Entered By: Selena Batten Dance CMA (AAMA) (August 27, 2010 9:29 AM) CC: Cough/back pain   History of Present Illness: CC: cough/back pain  seen 1 mo ago with sinusitis, treated with 10 day course amox.  and got better.  bad cough 3d ago.  + thick yellow mucous.  congested.  neti pot clear drainage.  + post tussive emesis x 1.  + nausea.  using humidifier in house.  taking mucinex.  cheratussin didn't help.  used hydrocodone cough syrup as well didn't really help.  tessalon perls help alot.  No fevers/chills, rash.  Children and wife have been sick recently.  lower back pain x >1wk.  alleve helps.  Did lift heavy equipment 1/2 wks ago.  No blood in urine noted.  worried because has h/o kidney infection after stones.  Current Medications (verified): 1)  Protonix 40 Mg  Pack (Pantoprazole Sodium) .... Take 1 Tablet By Mouth Two Times A Day 2)  Vitamin C 500 Mg  Tabs (Ascorbic Acid) .... Takes Two Daily 3)  Multivitamins  Tabs (Multiple Vitamin) .... Take 1 By Mouth Once Daily 4)  Tylenol 325 Mg Tabs (Acetaminophen) .... As Needed 5)  Advil 200 Mg Tabs (Ibuprofen) .... As Needed 6)  Glucosamine-Chondroitin   Caps (Glucosamine-Chondroit-Vit C-Mn) .... Once Daily 7)  Mucinex 600 Mg Xr12h-Tab (Guaifenesin) .... Takes 1-1/2 Tablets Once Daily  Allergies: 1)  ! Nexium 2)  ! * Doxycycline 3)  ! Percocet (Oxycodone-Acetaminophen)  Past History:  Past Medical History: Last updated: 08/10/2009 GERD MONO IN HIGH SCHOOL ? EXERCISED INDUCED ASTHMA BACK PAIN; CHRONIC:(DR. MARK YATES)  Social History: Last updated: 08/10/2009 Marital Status: Married LIFES WITH WIFE Children: 2 children works at [employer] in the [employer] section Drinks  one caffeinated beverage a day, does not smoke cigarettes, does not drink alcohol  Review of Systems       per HPI  Physical Exam  General:  Well-developed,well-nourished,in no acute distress; alert,appropriate and cooperative throughout examination, nontoxic, + cough Head:  Normocephalic and atraumatic without obvious abnormalities. No apparent alopecia or balding. mild sinus tenderness throughout Eyes:  PERRLA, EOMI, no injection Ears:  TMs clear bilaterally Nose:  nares clear bilaterally Mouth:  MMM, slight pharyngeal erythema, no exudates Neck:  no LAD Lungs:  Normal respiratory effort, chest expands symmetrically. Lungs are clear to auscultation, no crackles or wheezes. Heart:  Normal rate and regular rhythm. S1 and S2 normal without gallop, murmur, click, rub or other extra sounds. Msk:  some midline pain lumbar region, minimally tight paraspinous mm lumbar.  neg SLR test bialterally.  no SI joint pain.  FROM flexion/ extension of spine Pulses:  2+ rad pulses Extremities:  no pedal edema    Impression & Recommendations:  Problem # 1:  BACK PAIN, LUMBAR (ICD-724.2) lumbar strain likely from heavy lifting recently.  supportive care, return if not better with NSAID/flexeril combo and rest.  checked UA given h/o kidney stones and felt like this in past.  normal.  His updated medication list for this problem includes:    Tylenol 325 Mg Tabs (Acetaminophen) .Marland Kitchen... As needed    Advil 200 Mg  Tabs (Ibuprofen) .Marland Kitchen... As needed    Naprosyn 500 Mg Tabs (Naproxen) ..... One by mouth two times a day as needed back pain    Flexeril 10 Mg Tabs (Cyclobenzaprine hcl) ..... One by mouth two times a day as needed muscle spasm  Problem # 2:  VIRAL URI (ICD-465.9) suportive care.  update if red flags. The following medications were removed from the medication list:    Cheratussin Ac 100-10 Mg/56ml Syrp (Guaifenesin-codeine) .Marland Kitchen... Take one teaspoon at bedtime as needed cough His updated medication  list for this problem includes:    Tylenol 325 Mg Tabs (Acetaminophen) .Marland Kitchen... As needed    Advil 200 Mg Tabs (Ibuprofen) .Marland Kitchen... As needed    Mucinex 600 Mg Xr12h-tab (Guaifenesin) .Marland Kitchen... Takes 1-1/2 tablets once daily    Tussionex Pennkinetic Er 10-8 Mg/34ml Lqcr (Hydrocod polst-chlorphen polst) ..... One by mouth two times a day as needed cough    Tessalon Perles 100 Mg Caps (Benzonatate) ..... One by mouth three times a day as needed cough    Naprosyn 500 Mg Tabs (Naproxen) ..... One by mouth two times a day as needed back pain  Complete Medication List: 1)  Protonix 40 Mg Pack (Pantoprazole sodium) .... Take 1 tablet by mouth two times a day 2)  Vitamin C 500 Mg Tabs (Ascorbic acid) .... Takes two daily 3)  Multivitamins Tabs (Multiple vitamin) .... Take 1 by mouth once daily 4)  Tylenol 325 Mg Tabs (Acetaminophen) .... As needed 5)  Advil 200 Mg Tabs (Ibuprofen) .... As needed 6)  Glucosamine-chondroitin Caps (Glucosamine-chondroit-vit c-mn) .... Once daily 7)  Mucinex 600 Mg Xr12h-tab (Guaifenesin) .... Takes 1-1/2 tablets once daily 8)  Tussionex Pennkinetic Er 10-8 Mg/27ml Lqcr (Hydrocod polst-chlorphen polst) .... One by mouth two times a day as needed cough 9)  Tessalon Perles 100 Mg Caps (Benzonatate) .... One by mouth three times a day as needed cough 10)  Naprosyn 500 Mg Tabs (Naproxen) .... One by mouth two times a day as needed back pain 11)  Flexeril 10 Mg Tabs (Cyclobenzaprine hcl) .... One by mouth two times a day as needed muscle spasm  Other Orders: UA Dipstick w/o Micro (manual) ([address])  Patient Instructions: 1)  For back - sounds like muscle strain, treat with anti inflammatory and muscle relaxant. 2)  For cough - tessalon perls during day and tussionex cough syrup for night time. 3)  sounds like viral upper respiratory infection.  4)  Push fluids, plenty of rest.  5)  Let us konw if not improving as expected or any fevers >101.5, worsening cough/ breathing. 6)  Good to  see you tdoay. Prescriptions: FLEXERIL 10 MG TABS (CYCLOBENZAPRINE HCL) one by mouth two times a day as needed muscle spasm  #30 x 0   Entered and Authorized by:   Eustaquio Boyden  MD   Signed by:   Eustaquio Boyden  MD on 08/27/2010   Method used:   Electronically to        [facility]* (retail)       [address]       [place], [place]  [address]       Ph: [phone]       Fax: [phone]   RxID:   [id number] NAPROSYN 500 MG TABS (NAPROXEN) one by mouth two times a day as needed back pain  #30 x 0   Entered and Authorized by:   Eustaquio Boyden  MD   Signed by:   Eustaquio Boyden  MD on  08/27/2010   Method used:   Electronically to        [facility]* (retail)       [address]       [place], [place]  [address]       Ph: [phone]       Fax: [phone]   RxID:   [id number] TESSALON PERLES 100 MG CAPS (BENZONATATE) one by mouth three times a day as needed cough  #30 x 1   Entered and Authorized by:   Eustaquio Boyden  MD   Signed by:   Eustaquio Boyden  MD on 08/27/2010   Method used:   Electronically to        [facility]* (retail)       [address]       [place], [place]  [address]       Ph: [phone]       Fax: [phone]   RxID:   [id number] OACZYSAYT KZSWFUXNATF ER 10-8 MG/5ML LQCR (HYDROCOD POLST-CHLORPHEN POLST) one by mouth two times a day as needed cough  #100cc x 0   Entered and Authorized by:   Eustaquio Boyden  MD   Signed by:   Eustaquio Boyden  MD on 08/27/2010   Method used:   Print then Give to Patient   RxID:   [id number]    Orders Added: 1)  UA Dipstick w/o Micro (manual) [[address]] 2)  Est. Patient Level III [[address]]    Current Allergies (reviewed today): ! NEXIUM ! * DOXYCYCLINE ! PERCOCET (OXYCODONE-ACETAMINOPHEN)  Laboratory Results   Urine Tests  Date/Time Received: August 27, 2010 9:59 AM  Date/Time Reported: August 27, 2010 9:59 AM   Routine Urinalysis   Color: yellow Appearance:  Clear Glucose: negative   (Normal Range: Negative) Bilirubin: negative   (Normal Range: Negative) Ketone: negative   (Normal Range: Negative) Spec. Gravity: 1.020   (Normal Range: 1.003-1.035) Blood: negative   (Normal Range: Negative) pH: 6.0   (Normal Range: 5.0-8.0) Protein: negative   (Normal Range: Negative) Urobilinogen: 0.2   (Normal Range: 0-1) Nitrite: negative   (Normal Range: Negative) Leukocyte Esterace: negative   (Normal Range: Negative)

## 2010-09-23 NOTE — Progress Notes (Signed)
Summary: not feeling any better  Phone Note Call from Antonio Lara Call back at Home Phone [phone]   Caller: Antonio Lara Call For: Antonio Lara Summary of Call: Antonio Lara was seen on the 9th by Dr. Reece Agar. He says that he is still not feeling any bettter and that his cough has gotten worse and his having alot of mucous and nasal congestion. He is asking if he can get an antibiotic called in to [facility]. Please advise.   Initial call taken by: [name],  August 04, 2010 1:09 PM  Follow-up for Phone Call        Reviewing notes, this in Dr. Reece Agar POC.  Call in Amox 875 mg, 1 by mouth two times a day, #20. confirm no PCN allergy. Spencer Copland Lara  August 04, 2010 1:11 PM   Additional Follow-up for Phone Call Additional follow up Details #1::        rx called in and no allergy to penicillin Antonio Lara been on this multiple times in the past.[name]   Additional Follow-up by: [name] [name]),  August 04, 2010 1:19 PM    New/Updated Medications: AMOXICILLIN 500 MG CAPS (AMOXICILLIN) one tablet twice daily for 10 days AMOXICILLIN 875 MG TABS (AMOXICILLIN) one tablet twice daily for 10 days Prescriptions: AMOXICILLIN 875 MG TABS (AMOXICILLIN) one tablet twice daily for 10 days  #20 x 0   Entered by:   [name] (AAMA)   Authorized by:   Antonio Lara   Signed by:   [name] (AAMA) on 08/04/2010   Method used:   Electronically to        [facility]* (retail)       [address]       [place], [place]  [address]       Ph: [phone]       Fax: [phone]   RxID:   [id number] AMOXICILLIN 500 MG CAPS (AMOXICILLIN) one tablet twice daily for 10 days  #20 x 0   Entered by:   [name] (AAMA)   Authorized by:   Antonio Lara   Signed by:   [name] (AAMA) on 08/04/2010   Method used:   Electronically to        [facility]* (retail)       [address]       [place], [place]  [address]  Ph: [phone]       Fax: [phone]   RxID:   [id number]   Prior Medications: PROTONIX 40 MG  PACK (PANTOPRAZOLE SODIUM) Take 1 tablet by mouth two times a day VITAMIN C 500 MG  TABS (ASCORBIC ACID) takes two daily MULTIVITAMINS  TABS (MULTIPLE VITAMIN) take 1 by mouth once daily TYLENOL 325 MG TABS (ACETAMINOPHEN) as needed ADVIL 200 MG TABS (IBUPROFEN) as needed GLUCOSAMINE-CHONDROITIN   CAPS (GLUCOSAMINE-CHONDROIT-VIT C-MN) once daily MUCINEX 600 MG XR12H-TAB (GUAIFENESIN) Takes 1-1/2 tablets once daily CHERATUSSIN AC 100-10 MG/5ML SYRP (GUAIFENESIN-CODEINE) take one teaspoon at bedtime as needed cough Current Allergies: ! NEXIUM ! * DOXYCYCLINE ! PERCOCET (OXYCODONE-ACETAMINOPHEN)

## 2010-09-23 NOTE — Letter (Signed)
Summary: Out of Work  [facility]  [address] [place], [place] [address]   Phone: [phone]  Fax: [phone]    August 27, 2010   Employee:  Antonio Lara    To Whom It May Concern:   For Medical reasons, please excuse the above named employee from work for the following dates:  Start:  August 27, 2010   End:  August 27, 2010   If you need additional information, please feel free to contact our office.         Sincerely,      Selena Batten Dance CMA (AAMA)

## 2010-11-29 ENCOUNTER — Telehealth: Payer: Self-pay | Admitting: *Deleted

## 2010-11-29 NOTE — Telephone Encounter (Signed)
Pt called stating that he vomited several times last night.  No nausea today but now has diarrhea.  No fever, some abd pain.  Asked what to do.  Advised rest, no dairy products, avoid solid foods until all nausea is gone. Advised to push clear fluids, wash hands frequently. Let us know, or seek care elsewhere, if high fever or severe abd pain.

## 2011-01-04 NOTE — Assessment & Plan Note (Signed)
[facility]                                 ON-CALL NOTE   MITCHEAL, SWEETIN                     MRN:          [id number]  DATE:02/18/2008                            DOB:          October 08, 1975    CALLER:  Velora Heckler.   PHONE NUMBER:  [phone].   PRIMARY CARE PHYSICIAN:  Billie D. Bean, FNP   SUBJECTIVE:  Antonio Lara was seen at the urologist today and placed on  doxycycline for epididymitis.  He took one dose earlier this morning  without problems other than some minor dizziness.  He has been somewhat  nauseous with the epididymitis.  At his nighttime dose of doxycycline,  he was lying down when he took it, and it caused severe nausea and  caused him to regurgitate the pill.  He is currently having burning in  his chest and upset stomach.  He feels like his temperature is  increasing.   ASSESSMENT AND PLAN:  Recommend using Phenergan for nausea and keeping  up with hydration.  Given the nausea this evening, we will have him hold  off on any further antibiotics tonight.  He will call his urologist in  the morning likely for change in medication to levofloxacin.  If he is  feeling worse as opposed to better over the next few hours, he will be  evaluated at the emergency room.     Kerby Nora, MD  Electronically Signed    AB/MedQ  DD: 02/18/2008  DT: 02/19/2008  Job #: [id number]

## 2011-01-15 ENCOUNTER — Encounter: Payer: Self-pay | Admitting: Family Medicine

## 2011-01-19 ENCOUNTER — Encounter: Payer: Self-pay | Admitting: Family Medicine

## 2011-01-19 ENCOUNTER — Ambulatory Visit (INDEPENDENT_AMBULATORY_CARE_PROVIDER_SITE_OTHER): Payer: BC Managed Care – PPO | Admitting: Family Medicine

## 2011-01-19 DIAGNOSIS — M545 Low back pain, unspecified: Secondary | ICD-10-CM

## 2011-01-19 DIAGNOSIS — M542 Cervicalgia: Secondary | ICD-10-CM

## 2011-01-19 MED ORDER — TIZANIDINE HCL 4 MG PO TABS: 4.0000 mg | ORAL_TABLET | Freq: Every evening | ORAL | Status: DC

## 2011-01-19 MED ORDER — PREDNISONE 10 MG PO TABS: ORAL_TABLET | ORAL | Status: AC

## 2011-01-19 NOTE — Progress Notes (Signed)
35 year old male:  Neck, back pain.  Had some epidydimitis. A while back.  Last night was having some stiff legs.  Yesterday, was moving some things in office.   Took some flexeril last night. Stiff and sharp pain in the back .  Some tingling in his arms and arms. Maybe some sounds with motion in his neck.  Occ tingling all extremities ? Weakness L arm  36-40 hours a week. 61 and 35 year old at home.   The above patient presents with back pain that has been ongoing for approximately: several months The patient has had back pain before. The back pain is localized into the lumbar spine area, cervical spine. They also describe no radiculopathy.  + tingling. No bowel or bladder incontinence.  Prior interventions: flexeril, NSAIDS Physical therapy: No Chiropractic manipulations: no Acupuncture: No Osteopathic manipulation: No Heat or cold: Minimal effect  REVIEW OF SYSTEMS  GEN: No fevers, chills. Nontoxic. Primarily MSK c/o today. MSK: Detailed in the HPI GI: tolerating PO intake without difficulty Neuro: As above  Otherwise the pertinent positives of the ROS are noted above.    The PMH, PSH, Social History, Family History, Medications, and allergies have been reviewed in [facility], and have been updated if relevant.  PHYSICAL EXAM  Blood pressure 120/70, pulse 87, temperature 98.4 F (36.9 C), temperature source Oral, height 6' (1.829 m), weight 184 lb 12.8 oz (83.825 kg), SpO2 99.00%.  Gen: Well-developed,well-nourished,in no acute distress; alert,appropriate and cooperative throughout examination HEENT: Normocephalic and atraumatic without obvious abnormalities.  Ears, externally no deformities Pulm: Breathing comfortably in no respiratory distress Range of motion at  the waist: Flexion: nml Extension: nml Rotation: nml  No echymosis or edema Rises to examination table with no difficulty Gait: minimally antalgic  Inspection/Deformity: N Paraspinus T: Minimally antalgic     B Ankle Dorsiflexion (L5,4): 5/5 B Great Toe Dorsiflexion (L5,4): 5/5 Heel Walk (L5): WNL Toe Walk (S1): WNL Rise/Squat (L4): WNL, mild pain  SENSORY B Medial Foot (L4): WNL B Dorsum (L5): WNL B Lateral (S1): WNL Light Touch: WNL Pinprick: WNL  REFLEXES Knee (L4): 2+ Ankle (S1): 2+  B SLR, seated: neg B SLR, supine: neg B FABER: neg B Reverse FABER: neg B Greater Troch: NT B Log Roll: neg B Stork: NT B Sciatic Notch: NT Leg Lengths: equal    CERVICAL SPINE EXAM Range of motion: Flexion, extension, lateral bending, and rotation: Full Pain with terminal motion: none Spinous Processes: NT SCM: NT Upper paracervical muscles: TTP adjacent to cervical spine Upper traps: NT C5-T1 intact, sensation and motor    Assessment and plan:1.  Lumbar back pain 2. Neck pain Tingling occ, but not c/w focal to one nerve root, all arms, legs Now treat conservatively, and follow if not improved.  Out of shape. Counselled re exercise.  I reviewed with the patient the structures involved and how they related to their diagnosis.  If not progressing, these modalities can be helpful in select cases. Chiropractic or Osteopathic Manipulation Accupuncture  Start with medications, core rehab, and progress from there following low back pain algorithm.  No red flags are present.

## 2011-02-02 ENCOUNTER — Ambulatory Visit (INDEPENDENT_AMBULATORY_CARE_PROVIDER_SITE_OTHER)
Admission: RE | Admit: 2011-02-02 | Discharge: 2011-02-02 | Disposition: A | Discharge status: Home / Self Care | Payer: BC Managed Care – PPO | Source: Ambulatory Visit | Attending: Family Medicine | Admitting: Family Medicine

## 2011-02-02 ENCOUNTER — Encounter: Payer: Self-pay | Admitting: Family Medicine

## 2011-02-02 ENCOUNTER — Ambulatory Visit (INDEPENDENT_AMBULATORY_CARE_PROVIDER_SITE_OTHER): Payer: BC Managed Care – PPO | Admitting: Family Medicine

## 2011-02-02 DIAGNOSIS — M545 Low back pain, unspecified: Secondary | ICD-10-CM

## 2011-02-02 DIAGNOSIS — M542 Cervicalgia: Secondary | ICD-10-CM

## 2011-02-02 DIAGNOSIS — M5412 Radiculopathy, cervical region: Secondary | ICD-10-CM

## 2011-02-02 MED ORDER — GABAPENTIN 100 MG PO CAPS: 100.0000 mg | ORAL_CAPSULE | Freq: Three times a day (TID) | ORAL | Status: DC

## 2011-02-02 NOTE — Progress Notes (Signed)
35 year old male:  F/u neck and back Taking some zanaflex, and some prednisone - rest has been poor.  Had some jittery feelings, occ hand shaking, trouble sleeping  Low back pain is getting better L arm radiculopathy ongoing occaisionally Having some shaking.   Any kind of lifting will have some shaking. R foot went numb for a very short time.  No Fam of Rheum disease   01/19/2011 OV: Neck, back pain.  Had some epidydimitis. A while back.  Last night was having some stiff legs.  Yesterday, was moving some things in office.   Took some flexeril last night. Stiff and sharp pain in the back .  Some tingling in his arms and arms. Maybe some sounds with motion in his neck.  Occ tingling all extremities ? Weakness L arm  36-40 hours a week. 41 and 33 year old at home.   The above patient presents with back pain that has been ongoing for approximately: several months The patient has had back pain before. The back pain is localized into the lumbar spine area, cervical spine. They also describe no radiculopathy.  + tingling. No bowel or bladder incontinence.  Prior interventions: flexeril, NSAIDS Physical therapy: No Chiropractic manipulations: no Acupuncture: No Osteopathic manipulation: No Heat or cold: Minimal effect  REVIEW OF SYSTEMS  GEN: No fevers, chills. Nontoxic. Primarily MSK c/o today. MSK: Detailed in the HPI GI: tolerating PO intake without difficulty Neuro: As above  Otherwise the pertinent positives of the ROS are noted above.    The PMH, PSH, Social History, Family History, Medications, and allergies have been reviewed in [facility], and have been updated if relevant.  PHYSICAL EXAM Blood pressure 140/80, pulse 95, temperature 98.9 F (37.2 C), temperature source Oral, weight 184 lb 12.8 oz (83.825 kg), SpO2 98.00%.  Gen: Well-developed,well-nourished,in no acute distress; alert,appropriate and cooperative throughout examination HEENT: Normocephalic and  atraumatic without obvious abnormalities.  Ears, externally no deformities Pulm: Breathing comfortably in no respiratory distress Range of motion at  the waist: Flexion: nml Extension: nml Rotation: nml  No echymosis or edema Rises to examination table with no difficulty Gait: non antalgic  Inspection/Deformity: N Paraspinus T: Minimally antalgic   B Ankle Dorsiflexion (L5,4): 5/5 B Great Toe Dorsiflexion (L5,4): 5/5 Heel Walk (L5): WNL Toe Walk (S1): WNL Rise/Squat (L4): WNL, no pain  SENSORY B Medial Foot (L4): WNL B Dorsum (L5): WNL B Lateral (S1): WNL Light Touch: WNL Pinprick: WNL  REFLEXES Knee (L4): 2+ Ankle (S1): 2+  B SLR, seated: neg B SLR, supine: neg B FABER: neg B Reverse FABER: neg B Greater Troch: NT B Log Roll: neg B Stork: NT B Sciatic Notch: NT Leg Lengths: equal    CERVICAL SPINE EXAM Range of motion: Flexion, extension, lateral bending, and rotation: Full Pain with terminal motion: none Spinous Processes: NT SCM: NT Upper paracervical muscles: TTP adjacent to cervical spine - minimal spurling's negative Upper traps: NT C5-T1 intact, sensation and motor    Assessment and plan:1.  Lumbar back pain,improved 2. Neck pain Radiculopathy on L, may have nerve impingement. For now, start with Neurontin   Formal PT  Overall, he appears better without red flags

## 2011-02-02 NOTE — Patient Instructions (Addendum)
Recheck 6 weeks  REFERRAL: GO THE THE FRONT ROOM AT THE ENTRANCE OF OUR CLINIC, NEAR CHECK IN. ASK FOR MARION. SHE WILL HELP YOU SET UP YOUR REFERRAL. DATE: TIME:

## 2011-03-29 ENCOUNTER — Other Ambulatory Visit: Payer: Self-pay | Admitting: *Deleted

## 2011-03-29 MED ORDER — PANTOPRAZOLE SODIUM 40 MG PO TBEC: 40.0000 mg | DELAYED_RELEASE_TABLET | Freq: Two times a day (BID) | ORAL | Status: DC

## 2011-07-06 ENCOUNTER — Encounter: Payer: Self-pay | Admitting: Family Medicine

## 2011-07-06 ENCOUNTER — Ambulatory Visit (INDEPENDENT_AMBULATORY_CARE_PROVIDER_SITE_OTHER): Payer: BC Managed Care – PPO | Admitting: Family Medicine

## 2011-07-06 DIAGNOSIS — B9789 Other viral agents as the cause of diseases classified elsewhere: Secondary | ICD-10-CM

## 2011-07-06 DIAGNOSIS — B349 Viral infection, unspecified: Secondary | ICD-10-CM | POA: Insufficient documentation

## 2011-07-06 NOTE — Progress Notes (Signed)
  Subjective:    Patient ID: Antonio Lara, male    DOB: October 19, 1975, 35 y.o.   MRN: [id number]  HPI Pt of Dr Cyndie Chime here as acute appt for diarrhea for several days. Has had pretty much constant diarrhea since Sat. He saw UC on Sun and was told to be seen if not better in two days. He is also starting to get a sore throat today. He ate bread for a day. He had prolonged diarrhea for a week or so about five years ago, which spontaneously resolved on its own. It has ranged from loose to pure water.  He has not travelled lately other than [place].   He has also been sore in the lateral lower back with this. He has had some nausea, no vomitting. He had small headache yesterday, now resolved. He has not had fever or chills. He has some ear discomfort, no rhinitis but some nasal congestion, has had PND, at times fairly thick, some ST and some cough a week before the diarrhea which continues. His children have been sick also. They are 4 and 2, go to child care.    Review of Systems  Constitutional: Negative for fever, chills, diaphoresis, activity change, appetite change and fatigue.  HENT: Negative for hearing loss, ear pain, congestion, sore throat, rhinorrhea, neck pain, neck stiffness, postnasal drip, sinus pressure, tinnitus and ear discharge.   Eyes: Negative for pain, discharge and visual disturbance.  Respiratory: Negative for cough, shortness of breath and wheezing.   Cardiovascular: Negative for chest pain and palpitations.       No SOB w/ exertion  Gastrointestinal:       No heartburn or swallowing problems.  Genitourinary:       No nocturia  Skin:       No itching or dryness.  Neurological:       No tingling or balance problems.  All other systems reviewed and are negative.       Objective:   Physical Exam  Constitutional: He appears well-developed and well-nourished. No distress.  HENT:  Head: Normocephalic and atraumatic.  Right Ear: External ear normal.  Left Ear:  External ear normal.  Nose: Nose normal.  Mouth/Throat: Oropharynx is clear and moist.  Eyes: Conjunctivae and EOM are normal. Pupils are equal, round, and reactive to light. Right eye exhibits no discharge. Left eye exhibits no discharge.  Neck: Normal range of motion. Neck supple.  Cardiovascular: Normal rate and regular rhythm.   Pulmonary/Chest: Effort normal and breath sounds normal. He has no wheezes.  Abdominal: Soft. Bowel sounds are normal. He exhibits no distension. There is no tenderness. There is no rebound and no guarding.  Lymphadenopathy:    He has no cervical adenopathy.  Skin: He is not diaphoretic.          Assessment & Plan:

## 2011-07-06 NOTE — Patient Instructions (Addendum)
Stay on clear fluids for 24 hrs. Then go to [facility] for another 24 hrs. Then advance to solid food, one food at a time. Avoid milk and milk products for a week.  Take Guaifenesin (400mg ), take 11/2 tabs by mouth AM and NOON. Get GUAIFENESIN by  going to [facility] or [facility] and getting MUCOUS RELIEF EXPECTORANT/CONGESTION. DO NOT GET MUCINEX (Timed Release Guaifenesin) Drink more fluids. Gargle with 30ccs of warm salt water every half hour for 2 days as able. Delsym if needed.

## 2011-07-06 NOTE — Assessment & Plan Note (Signed)
See instructions. RTC if sxs don't resolve. 

## 2011-09-08 ENCOUNTER — Ambulatory Visit (INDEPENDENT_AMBULATORY_CARE_PROVIDER_SITE_OTHER): Payer: BC Managed Care – PPO | Admitting: Family Medicine

## 2011-09-08 ENCOUNTER — Encounter: Payer: Self-pay | Admitting: Family Medicine

## 2011-09-08 VITALS — BP 120/78 | HR 97 | Temp 99.2°F | Ht 72.0 in | Wt 185.8 lb

## 2011-09-08 DIAGNOSIS — H8309 Labyrinthitis, unspecified ear: Secondary | ICD-10-CM

## 2011-09-08 DIAGNOSIS — J019 Acute sinusitis, unspecified: Secondary | ICD-10-CM

## 2011-09-08 MED ORDER — LEVOFLOXACIN 500 MG PO TABS: 500.0000 mg | ORAL_TABLET | Freq: Every day | ORAL | Status: AC

## 2011-09-08 NOTE — Progress Notes (Signed)
  Patient Name: Antonio Lara Date of Birth: [birth year] Age: 36 y.o. Medical Record Number: [id number] Gender: male Date of Encounter: 09/08/2011  History of Present Illness:  Antonio Lara is a 36 y.o. very pleasant male patient who presents with the following:  Some congestion, but a vertigo type sensation, then felt dizzy. Ears have been clogged - has been ongoing for a couple of weeks. Pain in one ear or the other. Some stabbing pain and some congestion. Mucous and more thick and dark green in his throat.   Had some diarrhea for one day. Some chest congestion. Ringing in ears.  Nausea. For 2-3 days. Also recently the last few days he started to have some very significant facial pain and pain behind his eyes.   Past Medical History, Surgical History, Social History, Family History, Problem List, Medications, and Allergies have been reviewed and updated if relevant.  Review of Systems: ROS: GEN: Acute illness details above GI: Tolerating PO intake GU: maintaining adequate hydration and urination Pulm: No SOB Interactive and getting along well at home.  Otherwise, ROS is as per the HPI.   Physical Examination: Filed Vitals:   09/08/11 1030  BP: 120/78  Pulse: 97  Temp: 99.2 F (37.3 C)  TempSrc: Oral  Height: 6' (1.829 m)  Weight: 185 lb 12.8 oz (84.278 kg)  SpO2: 100%    Body mass index is 25.20 kg/(m^2).   Gen: WDWN, NAD; alert,appropriate and cooperative throughout exam  HEENT: Normocephalic and atraumatic. Throat clear, w/o exudate, no LAD, R TM clear, L TM - good landmarks, No fluid present. rhinnorhea.   With head rotation in standing from a position where his head is between his legs, mild induction of vertigo.  Left frontal and maxillary sinuses: Tender Right frontal and maxillary sinuses: Tender  Neck: No ant or post LAD CV: RRR, No M/G/R Pulm: Breathing comfortably in no resp distress. no w/c/r Abd: S,NT,ND,+BS Extr: no c/c/e Psych: full  affect, pleasant  Assessment and Plan: 1. Sinusitis acute   2. Labyrinthitis, viral     LVQ Reviewed viral labyrinthitis - usually gets better, f/u 3-4 weeks if not. Dramamine for now

## 2011-09-08 NOTE — Patient Instructions (Signed)
Meclizine 25 mg (dramamine)

## 2011-11-04 ENCOUNTER — Telehealth: Payer: Self-pay | Admitting: Family Medicine

## 2011-11-04 NOTE — Telephone Encounter (Signed)
[facility] Triage Call Report Triage Record Num: [id number] Operator: Durward Mallard [facility] Patient Name: Antonio Lara Call Date & Time: 11/04/2011 1:40:09PM Patient Phone: [phone] PCP: Hannah Beat Patient Gender: Male PCP Fax : [phone] Patient DOB: [birth year] Practice Name: [facility] [address] Reason for Call: Caller: Giovani/Mother; PCP: Juleen China.; CB#: [phone]; Call regarding Cough/Congestion; sx started 4-5 days ago approx on 10/31/11; pt is very sob and winded when he is trying to talk; SOB started 11/01/11; little lightheaded; has inhaler and used today but not really helping; he states that if he doesn't talk he is ok; but can not talk without sob; Triaged per Breathing Problems Guideline; Call 911 d/t worsening breathing and known respiratory condition and not responding to tx; pt states that he is going to go by private vehicle; [facility]; encouraged 911; instructed to have another person drive if not calling [phone]; will comply; office appt booked and instructed to send to UC/ER if need to be seen Protocol(s) Used: Breathing Problems Recommended Outcome per Protocol: Activate EMS 911 Reason for Outcome: Worsening breathing problems AND known cardiac or respiratory condition (coronary artery disease, angina, heart failure, asthma, chronic bronchitis, COPD) NOT responding to treatment OR treatment not available. Care Advice: ~ An adult should stay with the patient, preferably one trained in CPR. ~ IMMEDIATE ACTION 03/

## 2011-12-12 ENCOUNTER — Other Ambulatory Visit: Payer: Self-pay | Admitting: *Deleted

## 2011-12-12 MED ORDER — PANTOPRAZOLE SODIUM 40 MG PO TBEC: 40.0000 mg | DELAYED_RELEASE_TABLET | Freq: Two times a day (BID) | ORAL | Status: DC

## 2012-02-09 ENCOUNTER — Encounter: Payer: Self-pay | Admitting: Family Medicine

## 2012-02-09 ENCOUNTER — Ambulatory Visit (INDEPENDENT_AMBULATORY_CARE_PROVIDER_SITE_OTHER): Payer: BC Managed Care – PPO | Admitting: Family Medicine

## 2012-02-09 VITALS — BP 98/78 | HR 94 | Temp 98.6°F | Wt 190.8 lb

## 2012-02-09 DIAGNOSIS — R05 Cough: Secondary | ICD-10-CM | POA: Insufficient documentation

## 2012-02-09 DIAGNOSIS — R059 Cough, unspecified: Secondary | ICD-10-CM

## 2012-02-09 MED ORDER — ALBUTEROL SULFATE HFA 108 (90 BASE) MCG/ACT IN AERS: 2.0000 | INHALATION_SPRAY | Freq: Four times a day (QID) | RESPIRATORY_TRACT | Status: DC | PRN

## 2012-02-09 MED ORDER — BENZONATATE 200 MG PO CAPS: 200.0000 mg | ORAL_CAPSULE | Freq: Three times a day (TID) | ORAL | Status: AC | PRN

## 2012-02-09 NOTE — Progress Notes (Signed)
Sx stared about 1 week ago.  Nasal congestion initially, rhinorrhea, stuffy.  Taking mucinex.  Tried pseudophed w/o much relief. Nasal congestion is better but now with chest congestion.  H/o walking PNA and wanted eval.  Fatigued.  Cough with a deep breath.  Some sputum production, discolored. Voice changes noted.  Mild ST.   No fevers known.  Myalgias noted prev.    Meds, vitals, and allergies reviewed.  recheck BP 120/84  ROS: See HPI.  Otherwise, noncontributory.  GEN: nad, alert and oriented HEENT: mucous membranes moist, tm w/o erythema, nasal exam w/o erythema, scant clear discharge noted,  OP with mild cobblestoning NECK: supple w/o LA CV: rrr.   PULM: ctab, no inc wob EXT: no edema SKIN: no acute rash

## 2012-02-09 NOTE — Assessment & Plan Note (Signed)
Likely viral process. Would use SABA and tessalon prn for cough and f/u prn.  No indication for abx at this point.  Nontoxic.  ddx d/w pt.

## 2012-02-09 NOTE — Patient Instructions (Addendum)
Get some rest, drink plenty of fluids and use the inhaler if needed.  Tessalon can help with the cough also. Take care.

## 2012-02-29 ENCOUNTER — Encounter: Payer: Self-pay | Admitting: Family Medicine

## 2012-02-29 ENCOUNTER — Ambulatory Visit (INDEPENDENT_AMBULATORY_CARE_PROVIDER_SITE_OTHER): Payer: BC Managed Care – PPO | Admitting: Family Medicine

## 2012-02-29 VITALS — BP 126/91 | HR 87 | Temp 98.4°F | Ht 71.0 in | Wt 190.2 lb

## 2012-02-29 DIAGNOSIS — K2900 Acute gastritis without bleeding: Secondary | ICD-10-CM

## 2012-02-29 DIAGNOSIS — B9689 Other specified bacterial agents as the cause of diseases classified elsewhere: Secondary | ICD-10-CM | POA: Insufficient documentation

## 2012-02-29 DIAGNOSIS — J019 Acute sinusitis, unspecified: Secondary | ICD-10-CM

## 2012-02-29 MED ORDER — AMOXICILLIN-POT CLAVULANATE 875-125 MG PO TABS: 1.0000 | ORAL_TABLET | Freq: Two times a day (BID) | ORAL | Status: AC

## 2012-02-29 NOTE — Assessment & Plan Note (Signed)
With ongoing sinus congestion/ tenderness and some purulent drainage ever since illness at end of June Will tx with augmentin Disc symptomatic care - see instructions on AVS  Update if not starting to improve in a week or if worsening

## 2012-02-29 NOTE — Assessment & Plan Note (Signed)
With LUQ abd pain and burning in addn to GERD that is generally worse lately Disc diet and what to avoid Will inc his protonix to bid  F/u with Dr Patsy Lager Will call if symptoms worsen or change in the meantime

## 2012-02-29 NOTE — Patient Instructions (Addendum)
Take augmentin for sinus infection Try nasal saline spray also to loosen congestion  mucinex is ok to  Track your temperature Increase protonix to twice daily  Avoid ibuprofen / aleve Follow up with Dr Patsy Lager in next several weeks  Update if not starting to improve in a week or if worsening

## 2012-02-29 NOTE — Progress Notes (Signed)
Subjective:    Patient ID: Antonio Lara, male    DOB: [birth year], 36 y.o.   MRN: [id number]  HPI Had a bad virus at the end of June with uri symptoms- tx symptomatically  ? If ever got completely better   Has gerd with protonix- still has problems with that lately - gassy Feels bloated in his lower abdomen too   About 5 days ago- very fatigued and achey all over  Took a pain pill yesterday and had to lie down , and had some chills Thinks he had a fever   Then got dizzy this am when he stood up quickly  Was quickly better from that  Still just foggy and weak feeling  Not achey now  Congestion in nose - unable to blow a lot out - white and green phlegm , post nasal drip  Inside of nose tends to scab up  No sinus pain/ a little pressure  Feels like he needs to cough -- but afraid to because it will hurt  Not wheezing   Took some flexeril last night  Also some nyquil  Aleve this am    No tick bites No insect bites at all  No rash   Patient Active Problem List  Diagnosis  . ALLERGIC RHINITIS  . GERD  . BACK PAIN, LUMBAR  . Cough   Past Medical History  Diagnosis Date  . GERD (gastroesophageal reflux disease)   . Mononucleosis     in high school  . Asthma, exercise induced          ?  . Back pain, chronic     Dr. Annell Greening   No past surgical history on file. History  Substance Use Topics  . Smoking status: Never Smoker   . Smokeless tobacco: Never Used  . Alcohol Use: Yes     rarely   No family history on file. Allergies  Allergen Reactions  . Doxycycline     REACTION: DIZZY/ NAUSEA AND VOMITING  . Esomeprazole Magnesium     REACTION: diarrhea  . Oxycodone-Acetaminophen     REACTION: extemely dizzy with nausea and vomiting.   Current Outpatient Prescriptions on File Prior to Visit  Medication Sig Dispense Refill  . albuterol (PROVENTIL HFA;VENTOLIN HFA) 108 (90 BASE) MCG/ACT inhaler Inhale 2 puffs into the lungs every 6 (six) hours as needed  (cough).  1 Inhaler  0  . Misc Natural Products (GLUCOSAMINE CHONDROITIN ADV PO) Once daily       . Multiple Vitamin (MULTIVITAMIN) tablet Take 1 tablet by mouth daily.        . pantoprazole (PROTONIX) 40 MG tablet Take 40 mg by mouth daily.      . vitamin C (ASCORBIC ACID) 500 MG tablet Take 2 daily       . dextromethorphan-guaiFENesin (MUCINEX DM) 30-600 MG per 12 hr tablet Take 1 tablet by mouth every 12 (twelve) hours.               Review of Systems Review of Systems  Constitutional: Negative for fever, appetite change, and unexpected weight change. pos for malaise Eyes: Negative for pain and visual disturbance.  ENT pos for nasal congestion with some post nasal drip and purulent drainage, neg for ST Respiratory: Negative for cough and shortness of breath.   Cardiovascular: Negative for cp or palpitations    Gastrointestinal: Negative for nausea, diarrhea and constipation.  Genitourinary: Negative for urgency and frequency.  Skin: Negative for pallor or rash  or  insect or tick bites  MSK pos for generalized achiness that comes and goes, neg for swollen or painful joints Neurological: Negative for weakness, light-headedness, numbness and headaches.  Hematological: Negative for adenopathy. Does not bruise/bleed easily.  Psychiatric/Behavioral: Negative for dysphoric mood. The patient is not nervous/anxious.         Objective:   Physical Exam  Constitutional: He appears well-developed and well-nourished. No distress.  HENT:  Head: Normocephalic and atraumatic.  Right Ear: External ear normal.  Left Ear: External ear normal.  Mouth/Throat: Oropharynx is clear and moist. No oropharyngeal exudate.       Nares are injected and congested  bilat ethmoid and maxillary sinus tenderness  Eyes: Conjunctivae and EOM are normal. Pupils are equal, round, and reactive to light. Right eye exhibits no discharge. Left eye exhibits no discharge.  Neck: Normal range of motion. Neck supple. No  thyromegaly present.  Cardiovascular: Normal rate and regular rhythm.   Pulmonary/Chest: Effort normal and breath sounds normal. No respiratory distress. He has no wheezes. He has no rales.  Abdominal: Soft. Bowel sounds are normal. He exhibits no distension and no mass. There is tenderness. There is no rebound and no guarding.       Mild tenderness in epigastic and LUQ areas of abdomen Nl bs   Musculoskeletal: He exhibits no edema.  Lymphadenopathy:    He has no cervical adenopathy.  Neurological: He is alert. He has normal reflexes. No cranial nerve deficit. He exhibits normal muscle tone. Coordination normal.  Skin: Skin is warm and dry. No rash noted. No erythema. No pallor.  Psychiatric: He has a normal mood and affect.          Assessment & Plan:

## 2012-03-22 ENCOUNTER — Telehealth: Payer: Self-pay | Admitting: Family Medicine

## 2012-03-22 NOTE — Telephone Encounter (Signed)
Caller: Antonio Lara/Patient; PCP: Antonio Beat T.; CB#: [phone]; ; ; Call regarding Cough/Congestion;  Onset-03/18/12  Afebrile. Pt c/o of sinus pressure and congestion, sore throat, productive cough with green-yellow and brown phlegm. Emergent s/s of URI protocol r/o. Pt to see provider within 24hrs. No appts for today, pt scheduled for tomorrrow at 10:45am with Dr. Ermalene Lara. Offered early morning appt tomorrow, could not come due to work schedule.

## 2012-03-23 ENCOUNTER — Ambulatory Visit (INDEPENDENT_AMBULATORY_CARE_PROVIDER_SITE_OTHER): Payer: BC Managed Care – PPO | Admitting: Family Medicine

## 2012-03-23 ENCOUNTER — Encounter: Payer: Self-pay | Admitting: Family Medicine

## 2012-03-23 VITALS — BP 100/89 | HR 80 | Temp 99.9°F | Wt 186.0 lb

## 2012-03-23 DIAGNOSIS — B9689 Other specified bacterial agents as the cause of diseases classified elsewhere: Secondary | ICD-10-CM | POA: Insufficient documentation

## 2012-03-23 DIAGNOSIS — J309 Allergic rhinitis, unspecified: Secondary | ICD-10-CM

## 2012-03-23 DIAGNOSIS — J208 Acute bronchitis due to other specified organisms: Secondary | ICD-10-CM

## 2012-03-23 DIAGNOSIS — J209 Acute bronchitis, unspecified: Secondary | ICD-10-CM

## 2012-03-23 MED ORDER — HYDROCODONE-HOMATROPINE 5-1.5 MG/5ML PO SYRP: 5.0000 mL | ORAL_SOLUTION | Freq: Four times a day (QID) | ORAL | Status: AC | PRN

## 2012-03-23 MED ORDER — MOXIFLOXACIN HCL 400 MG PO TABS: 400.0000 mg | ORAL_TABLET | Freq: Every day | ORAL | Status: AC

## 2012-03-23 NOTE — Patient Instructions (Addendum)
Mucinex decongestant daily, nasal saline irrigation 3 times daily. Zyrtec at bedtime. Start avelox x 7 days for presumed  Bacteria bronchitits. Cough suppressant at night.

## 2012-03-23 NOTE — Progress Notes (Signed)
  Subjective:    Patient ID: Lionel December, male    DOB: 04-01-1976, 36 y.o.   MRN: [id number]  HPI  36 year old male seen 7/10  Dx with Acute bacterial sinusitis - Roxy Manns, MD  With ongoing sinus congestion/ tenderness and some purulent drainage ever since illness at end of June  Treated with augmentin x 10 days He reports that his symptoms improved a lot, resolved completely.. Could hear better. Had some diarrhea as SE.  Pt returns today with recurrent congestion, full feeling  in ears, returned about 5 days ago, got much worse yesterday. Productive cough, yellow, brown green. Some sneezing, some itchy eyes.  Hoarse voice,headhace, sinus pressure, body ache, sore throat, chills. Fatigue Low grade temperature at home and now.  Post tussive emesis this AM. No abdominal pain now.   Has been fatigued, loss of balance, dizzy  Some mild shortness of breath.   Has history of walking pneumonia last year.  Using sudafed, mucinex. Nasal saline irrigation.   Review of Systems  Constitutional: Positive for fever and fatigue.  Eyes: Negative for pain.  Respiratory: Positive for shortness of breath. Negative for wheezing.   Cardiovascular: Negative for palpitations and leg swelling.  Gastrointestinal: Negative for constipation and blood in stool.       Objective:   Physical Exam  Constitutional: Vital signs are normal. He appears well-developed and well-nourished.  Non-toxic appearance. He does not appear ill. No distress.       Hoarse voice, constant cough  HENT:  Head: Normocephalic and atraumatic.  Right Ear: Hearing, tympanic membrane, external ear and ear canal normal. No tenderness. No foreign bodies. Tympanic membrane is not retracted and not bulging.  Left Ear: Hearing, tympanic membrane, external ear and ear canal normal. No tenderness. No foreign bodies. Tympanic membrane is not retracted and not bulging.  Nose: Nose normal. No mucosal edema or rhinorrhea. Right  sinus exhibits no maxillary sinus tenderness and no frontal sinus tenderness. Left sinus exhibits no maxillary sinus tenderness and no frontal sinus tenderness.  Mouth/Throat: Uvula is midline, oropharynx is clear and moist and mucous membranes are normal. Normal dentition. No dental caries. No oropharyngeal exudate or tonsillar abscesses.  Eyes: Conjunctivae, EOM and lids are normal. Pupils are equal, round, and reactive to light. No foreign bodies found.  Neck: Trachea normal, normal range of motion and phonation normal. Neck supple. Carotid bruit is not present. No mass and no thyromegaly present.  Cardiovascular: Normal rate, regular rhythm, S1 normal, S2 normal, normal heart sounds, intact distal pulses and normal pulses.  Exam reveals no gallop.   No murmur heard. Pulmonary/Chest: Effort normal and breath sounds normal. No respiratory distress. He has no wheezes. He has no rhonchi. He has no rales.  Abdominal: Soft. Normal appearance and bowel sounds are normal. There is no hepatosplenomegaly. There is no tenderness. There is no rebound, no guarding and no CVA tenderness. No hernia.  Neurological: He is alert. He has normal reflexes.  Skin: Skin is warm, dry and intact. No rash noted.  Psychiatric: He has a normal mood and affect. His speech is normal and behavior is normal. Judgment normal.          Assessment & Plan:

## 2012-03-23 NOTE — Assessment & Plan Note (Addendum)
Given recent augmentin.. Will broaden antibiotics (intolerance to doxy in past) to cover atypical pneumonia  and bacterial cause of bronchitis.  Symptomatic care.

## 2012-03-23 NOTE — Assessment & Plan Note (Signed)
Start zyrtec as this may be reason why having recurrent infection.

## 2012-06-07 ENCOUNTER — Other Ambulatory Visit: Payer: Self-pay | Admitting: *Deleted

## 2012-06-07 MED ORDER — PANTOPRAZOLE SODIUM 40 MG PO TBEC: 40.0000 mg | DELAYED_RELEASE_TABLET | Freq: Two times a day (BID) | ORAL | Status: DC

## 2012-08-08 ENCOUNTER — Encounter: Payer: Self-pay | Admitting: Internal Medicine

## 2012-08-08 ENCOUNTER — Ambulatory Visit (INDEPENDENT_AMBULATORY_CARE_PROVIDER_SITE_OTHER): Payer: BC Managed Care – PPO | Admitting: Internal Medicine

## 2012-08-08 ENCOUNTER — Telehealth: Payer: Self-pay | Admitting: Family Medicine

## 2012-08-08 VITALS — BP 120/80 | HR 103 | Temp 97.9°F | Resp 12 | Wt 197.0 lb

## 2012-08-08 DIAGNOSIS — J019 Acute sinusitis, unspecified: Secondary | ICD-10-CM

## 2012-08-08 MED ORDER — AMOXICILLIN 500 MG PO TABS: 1000.0000 mg | ORAL_TABLET | Freq: Two times a day (BID) | ORAL | Status: DC

## 2012-08-08 NOTE — Telephone Encounter (Signed)
Patient Information:  Caller Name: Jerico  Phone: [phone]  Patient: Antonio Lara, Antonio Lara  Gender: Male  DOB: 09-Aug-1976  Age: 36 Years  PCP: Hannah Beat (Family Practice)  Office Follow Up:  Does the office need to follow up with this patient?: No  Instructions For The Office: N/A  RN Note:  Scheduled for 1215 today.  To use his Albuteral Inhaler as ordered.  Symptoms  Reason For Call & Symptoms: Patient calling, has had a cough and flu like sx x 3 days.  The cough is productive/green sputum.  Reviewed Health History In EMR: Yes  Reviewed Medications In EMR: Yes  Reviewed Allergies In EMR: Yes  Reviewed Surgeries / Procedures: Yes  Date of Onset of Symptoms: 08/05/2012  Treatments Tried: Albuteral Inhaler  Treatments Tried Worked: No  Guideline(s) Used:  Cough  Disposition Per Guideline:   See Today or Tomorrow in Office  Reason For Disposition Reached:   Patient wants to be seen  Advice Given:  N/A  Appointment Scheduled:  08/08/2012 12:15:00 Appointment Scheduled Provider:  Tillman Abide (Family Practice)

## 2012-08-08 NOTE — Assessment & Plan Note (Signed)
Has had some mild symptoms for a week but now with systemic symptoms No GI findings and nothing to suggest pneumonia Probably just sinus Will treat with amoxicillin due to the systemic symptoms

## 2012-08-08 NOTE — Telephone Encounter (Signed)
Will evaluate at OV 

## 2012-08-08 NOTE — Progress Notes (Signed)
  Subjective:    Patient ID: Antonio Lara, male    DOB: 1976-01-06, 36 y.o.   MRN: [id number]  HPI Started with patchy eyelid rash-- 5-8 days ago Red and flaky Then started with discharge and matting around tear ducts  Then sore feeling Tongue then sensitive--brief and resolved  Then started with sinus congestion Nasty thick mucus--some drainage Now more ill --nausea he thinks is due to swallowing mucus Some cough No apparent fever Some SOB this morning---using his albuterol today (known exercise asthma in past)  Current Outpatient Prescriptions on File Prior to Visit  Medication Sig Dispense Refill  . albuterol (PROVENTIL HFA;VENTOLIN HFA) 108 (90 BASE) MCG/ACT inhaler Inhale 2 puffs into the lungs every 6 (six) hours as needed (cough).  1 Inhaler  0  . pantoprazole (PROTONIX) 40 MG tablet Take 1 tablet (40 mg total) by mouth 2 (two) times daily.  60 tablet  5    Allergies  Allergen Reactions  . Doxycycline     REACTION: DIZZY/ NAUSEA AND VOMITING  . Esomeprazole Magnesium     REACTION: diarrhea  . Oxycodone-Acetaminophen     REACTION: extemely dizzy with nausea and vomiting.    Past Medical History  Diagnosis Date  . GERD (gastroesophageal reflux disease)   . Mononucleosis     in high school  . Asthma, exercise induced          ?  . Back pain, chronic     Dr. Annell Greening    No past surgical history on file.  No family history on file.  History   Social History  . Marital Status: Married    Spouse Name: N/A    Number of Children: 2  . Years of Education: N/A   Occupational History  . computer Presenter, broadcasting   Social History Main Topics  . Smoking status: Never Smoker   . Smokeless tobacco: Never Used  . Alcohol Use: Yes     Comment: rarely  . Drug Use: No  . Sexually Active: Not on file   Other Topics Concern  . Not on file   Social History Narrative   Drinks one caffeinated beverage a day.   Review of Systems Able to eat till last  night---no appetite today Hands are clammy    Objective:   Physical Exam  Constitutional: He appears well-developed and well-nourished. No distress.       Some retching  HENT:  Mouth/Throat: Oropharynx is clear and moist. No oropharyngeal exudate.       No sinus tenderness Marked nasal inflammation and thick colored mucus TMs normal  Neck: Normal range of motion. Neck supple.  Pulmonary/Chest: Effort normal and breath sounds normal. No respiratory distress. He has no wheezes. He has no rales.  Lymphadenopathy:    He has no cervical adenopathy.          Assessment & Plan:

## 2013-03-22 ENCOUNTER — Ambulatory Visit (INDEPENDENT_AMBULATORY_CARE_PROVIDER_SITE_OTHER): Payer: BC Managed Care – PPO | Admitting: Family Medicine

## 2013-03-22 ENCOUNTER — Encounter: Payer: Self-pay | Admitting: Family Medicine

## 2013-03-22 VITALS — BP 120/80 | HR 76 | Temp 98.4°F | Ht 72.0 in | Wt 190.0 lb

## 2013-03-22 DIAGNOSIS — K219 Gastro-esophageal reflux disease without esophagitis: Secondary | ICD-10-CM

## 2013-03-22 DIAGNOSIS — H0012 Chalazion right lower eyelid: Secondary | ICD-10-CM | POA: Insufficient documentation

## 2013-03-22 DIAGNOSIS — A084 Viral intestinal infection, unspecified: Secondary | ICD-10-CM | POA: Insufficient documentation

## 2013-03-22 DIAGNOSIS — H0019 Chalazion unspecified eye, unspecified eyelid: Secondary | ICD-10-CM

## 2013-03-22 DIAGNOSIS — A088 Other specified intestinal infections: Secondary | ICD-10-CM

## 2013-03-22 MED ORDER — ERYTHROMYCIN 5 MG/GM OP OINT: TOPICAL_OINTMENT | Freq: Two times a day (BID) | OPHTHALMIC | Status: DC

## 2013-03-22 NOTE — Assessment & Plan Note (Signed)
Warm compressess, erythro ointment.

## 2013-03-22 NOTE — Progress Notes (Signed)
  Subjective:    Patient ID: Antonio Lara, male    DOB: 01/25/1976, 37 y.o.   MRN: [id number]  HPI  37 year old male presents with  New eyelid lesion in last 2 days. Right lower lid red swollen nodule, scratchy area, irritating eye when he closes his eyes or blinks.  No fever.  Also has noted diarrhea x 2 days, no vomiting, has slight reflux, mild nausea.  No abdominal pain.  No dysuria.    Review of Systems  Constitutional: Negative for fever and fatigue.  HENT: Negative for ear pain.   Eyes: Positive for itching and visual disturbance. Negative for photophobia, pain, discharge and redness.  Respiratory: Negative for cough and shortness of breath.   Cardiovascular: Negative for chest pain, palpitations and leg swelling.  Gastrointestinal: Negative for abdominal pain and abdominal distention.       Occ cramping       Objective:   Physical Exam  Constitutional: Vital signs are normal. He appears well-developed and well-nourished.  HENT:  Head: Normocephalic.  Right Ear: Hearing normal.  Left Ear: Hearing normal.  Nose: Nose normal.  Mouth/Throat: Oropharynx is clear and moist and mucous membranes are normal.  Eyes: Conjunctivae and EOM are normal. Pupils are equal, round, and reactive to light. Right eye exhibits hordeolum. Right eye exhibits no chemosis, no discharge and no exudate. No foreign body present in the right eye. Left eye exhibits no chemosis, no discharge and no exudate. No foreign body present in the left eye.  Neck: Trachea normal. Carotid bruit is not present. No mass and no thyromegaly present.  Cardiovascular: Normal rate, regular rhythm and normal pulses.  Exam reveals no gallop, no distant heart sounds and no friction rub.   No murmur heard. No peripheral edema  Pulmonary/Chest: Effort normal and breath sounds normal. No respiratory distress.  Abdominal: Soft. Normal appearance and bowel sounds are normal. There is tenderness in the epigastric area.   Skin: Skin is warm, dry and intact. No rash noted.  Psychiatric: He has a normal mood and affect. His speech is normal and behavior is normal. Thought content normal.          Assessment & Plan:

## 2013-03-22 NOTE — Patient Instructions (Addendum)
Warm compresses on right eye 2-3 times daily. Apply erythromycin ointment to eye daily. Diarrhea likely a viral infecton... Push fluids, bland diet.  Can try nexium or prilosec 40 mg daily instead on protonix for trial to see if better control of reflux.

## 2013-03-22 NOTE — Assessment & Plan Note (Signed)
Can try prilosec of nexium 40 mg daily instead of protonix if you feel it is no longer working as well.

## 2013-03-22 NOTE — Assessment & Plan Note (Signed)
Symptomatic care 

## 2013-04-02 ENCOUNTER — Encounter: Payer: Self-pay | Admitting: Family Medicine

## 2013-04-02 ENCOUNTER — Ambulatory Visit (INDEPENDENT_AMBULATORY_CARE_PROVIDER_SITE_OTHER): Payer: BC Managed Care – PPO | Admitting: Family Medicine

## 2013-04-02 VITALS — BP 104/66 | HR 80 | Temp 98.5°F | Ht 72.0 in | Wt 192.0 lb

## 2013-04-02 DIAGNOSIS — L918 Other hypertrophic disorders of the skin: Secondary | ICD-10-CM

## 2013-04-02 DIAGNOSIS — L909 Atrophic disorder of skin, unspecified: Secondary | ICD-10-CM

## 2013-04-02 NOTE — Patient Instructions (Addendum)
Wash with warm soapy water, keep areas clean and dry, can apply bandaid and neosporin if needed.

## 2013-04-02 NOTE — Progress Notes (Signed)
  Subjective:    Patient ID: Antonio Lara, male    DOB: 1975/09/17, 37 y.o.   MRN: [id number]  HPI 37 year old male pt of D.r Copland's seen with chalazion on right lower lid oin 8/1... given erythromycin ointment and told to do warm compresses. He reports today that  That had reports that that leison resolved completely.  He has noted skin tag in right axillae as well as on left lower lid.  He wishes to have this removed.    Review of Systems  Constitutional: Negative for fever and fatigue.  HENT: Negative for ear pain.   Eyes: Negative for pain.  Respiratory: Negative for shortness of breath.        Objective:   Physical Exam  Skin:  tewo skin tags small... One under right axillae and one on left lower eyelid.          Assessment & Plan:    Procedure Note: Two skin tags frozen with two cycles of cryotherapy with 1 mm halo. Area treated near eye focused with funnel.  No complications. Pt reports no eye injury. No bleeding.

## 2013-05-22 ENCOUNTER — Encounter: Payer: Self-pay | Admitting: Internal Medicine

## 2013-05-22 ENCOUNTER — Ambulatory Visit (INDEPENDENT_AMBULATORY_CARE_PROVIDER_SITE_OTHER): Payer: BC Managed Care – PPO | Admitting: Internal Medicine

## 2013-05-22 ENCOUNTER — Telehealth: Payer: Self-pay | Admitting: Family Medicine

## 2013-05-22 VITALS — BP 120/74 | HR 75 | Temp 98.8°F | Resp 16 | Ht 72.0 in | Wt 191.0 lb

## 2013-05-22 DIAGNOSIS — R0602 Shortness of breath: Secondary | ICD-10-CM

## 2013-05-22 MED ORDER — PREDNISONE 20 MG PO TABS: 40.0000 mg | ORAL_TABLET | Freq: Every day | ORAL | Status: DC

## 2013-05-22 NOTE — Progress Notes (Signed)
  Subjective:    Patient ID: Antonio Lara, male    DOB: Apr 30, 1976, 37 y.o.   MRN: [id number]  HPI Has had bad shortness of breath 1 puff on inhaler helped till 3-4 days ago Started with fatigue 7-10 days ago---some better since yesterday  [facility] or so Got augmentin and cough syrup Diagnosed with chest and sinus infection  Still not better Trouble talking on the phone gets him winded Tingling in frontal areas still Throat has some tightness--- ?from drainage  Believes he has exercise induced asthma  Current Outpatient Prescriptions on File Prior to Visit  Medication Sig Dispense Refill  . albuterol (PROVENTIL HFA;VENTOLIN HFA) 108 (90 BASE) MCG/ACT inhaler Inhale 2 puffs into the lungs every 6 (six) hours as needed (cough).  1 Inhaler  0  . Multiple Vitamin (MULTIVITAMIN) tablet Take 1 tablet by mouth daily.      . pantoprazole (PROTONIX) 40 MG tablet Take 1 tablet (40 mg total) by mouth 2 (two) times daily.  60 tablet  5   No current facility-administered medications on file prior to visit.    Allergies  Allergen Reactions  . Doxycycline     REACTION: DIZZY/ NAUSEA AND VOMITING  . Esomeprazole Magnesium     REACTION: diarrhea  . Oxycodone-Acetaminophen     REACTION: extemely dizzy with nausea and vomiting.    Past Medical History  Diagnosis Date  . GERD (gastroesophageal reflux disease)   . Mononucleosis     in high school  . Asthma, exercise induced          ?  . Back pain, chronic     Dr. Annell Greening    No past surgical history on file.  No family history on file.   Review of Systems Some tingling in hands--better after the chiropractor Diarrhea now--yellow but now some solid. Started just before starting the augmentin Appetite is off--- eating very bland Slight nausea    Objective:   Physical Exam  Constitutional: He appears well-developed and well-nourished. No distress.  HENT:  Mouth/Throat: Oropharynx is clear and  moist. No oropharyngeal exudate.  Mild maxillary tenderness Moderate nasal inflammation TMs retracted without inflammation  Neck: Normal range of motion. Neck supple. No thyromegaly present.  Pulmonary/Chest: Effort normal and breath sounds normal. No respiratory distress. He has no wheezes. He has no rales.  Lymphadenopathy:    He has no cervical adenopathy.  Skin: No rash noted.          Assessment & Plan:

## 2013-05-22 NOTE — Telephone Encounter (Signed)
Patient Information:  Caller Name: Gear[place]  Phone: [phone]  Patient: Antonio Lara, Antonio Lara  Gender: Male  DOB: 1975/09/12  Age: 37 Years  PCP: Hannah Beat (Family Practice)  Office Follow Up:  Does the office need to follow up with this patient?: No  Instructions For The Office: N/A  RN Note:  Moderate wheezing present with deep breath; Was able to speak in sentences during call . Prior to calling, could speak in one word sentences if coughed or exerted himself.  Last used Albuterol 05/20/13 without much improvement.   Does not have a peak flow meter.  Describes chest restriction when tries to take deep breath. Feels like vision is "super focused" with haze around things. Advised to pull car to side of road but declined because he 'feels fine." Instructed to use Albuterol MDI now.   No appointments remain with Dr Patsy Lager.  Dr Ermalene Searing is not in office.   Symptoms  Reason For Call & Symptoms: Emergent Call:  Shortness of breath with dry cough and occasional tingling of face and arms and legs which usually is treated by chiropracter. History of asthma.  Currently driving.  Also noted diarrhea for past 4-5 days.  [facility] 05/17/13; diagnosed with sinusitis.  Started on Amox and Rx cough medication.  Reviewed Health History In EMR: Yes  Reviewed Medications In EMR: Yes  Reviewed Allergies In EMR: Yes  Reviewed Surgeries / Procedures: Yes  Date of Onset of Symptoms: 05/13/2013  Treatments Tried: Albuterol BID, Sudafed, Amoxicillin, Rx cough syrup, warm fluids, showers  Treatments Tried Worked: No  Guideline(s) Used:  Asthma Attack  Disposition Per Guideline:   See Today in Office  Reason For Disposition Reached:   Patient wants to be seen  Advice Given:  Quick-Relief Asthma Medicine:   Start your quick-relief medicine (e.g., albuterol, salbutamol) at the first sign of any coughing or shortness of breath (don't wait for wheezing). Use your inhaler  (2 puffs each time) or nebulizer every 4 hours. Continue the quick-relief medicine until you have not wheezed or coughed for 48 hours.  The best "cough medicine" for an adult with asthma is always the asthma medicine (Note: Don't use cough suppressants, but cough drops may help a tickly cough).  Drinking Liquids:  Try to drink normal amount of liquids (e.g., water). Being adequately hydrated makes it easier to cough up the sticky lung mucus.  Humidifier:   If the air is dry, use a cool mist humidifier to prevent drying of the upper airway.  Hay Fever  : If you have nasal symptoms from hay fever, it's OK to take antihistamines (Reasons: poor control of allergic rhinitis makes asthma worse whereas antihistamines don't make asthma worse).  Expected Course:  If treatment is started early, most asthma attacks are quickly brought under control. All wheezing should be gone by 5 days.  Call Back If:  Inhaled asthma medicine (nebulizer or inhaler) is needed more often than every 4 hours  Wheezing has not completely cleared after 5 days  You become worse.  Patient Will Follow Care Advice:  YES  Appointment Scheduled:  05/22/2013 11:00:00 Appointment Scheduled Provider:  [facility])

## 2013-05-22 NOTE — Assessment & Plan Note (Signed)
No signs of pneumonia Probably mild bronchial inflammation Will try a few days of prednisone  Finish out the augmentin for the sinusitis

## 2013-05-22 NOTE — Telephone Encounter (Signed)
See OV note.  

## 2013-06-14 ENCOUNTER — Encounter: Payer: Self-pay | Admitting: Internal Medicine

## 2013-06-14 ENCOUNTER — Ambulatory Visit (INDEPENDENT_AMBULATORY_CARE_PROVIDER_SITE_OTHER): Payer: BC Managed Care – PPO | Admitting: Internal Medicine

## 2013-06-14 VITALS — BP 100/68 | HR 85 | Temp 98.0°F | Wt 189.0 lb

## 2013-06-14 DIAGNOSIS — J4521 Mild intermittent asthma with (acute) exacerbation: Secondary | ICD-10-CM

## 2013-06-14 DIAGNOSIS — J4530 Mild persistent asthma, uncomplicated: Secondary | ICD-10-CM | POA: Insufficient documentation

## 2013-06-14 DIAGNOSIS — J45901 Unspecified asthma with (acute) exacerbation: Secondary | ICD-10-CM

## 2013-06-14 MED ORDER — MONTELUKAST SODIUM 10 MG PO TABS: 10.0000 mg | ORAL_TABLET | Freq: Every day | ORAL | Status: DC

## 2013-06-14 MED ORDER — PREDNISONE 20 MG PO TABS: 40.0000 mg | ORAL_TABLET | Freq: Every day | ORAL | Status: DC

## 2013-06-14 NOTE — Assessment & Plan Note (Signed)
Seems to clearly have active asthma in stead of just exercise induced  Will try prednisone burst again singulair at bedtime Recheck soon---if recurrent symptoms, will need inhaled steroid

## 2013-06-14 NOTE — Progress Notes (Signed)
  Subjective:    Patient ID: Antonio Lara, male    DOB: 02-03-1976, 37 y.o.   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 albuterol (PROVENTIL HFA;VENTOLIN HFA) 108 (90 BASE) MCG/ACT inhaler Inhale 2 puffs into the lungs every 6 (six) hours as needed (cough).  1 Inhaler  0  . Multiple Vitamin (MULTIVITAMIN) tablet Take 1 tablet by mouth daily.      . pantoprazole (PROTONIX) 40 MG tablet Take 1 tablet (40 mg total) by mouth 2 (two) times daily.  60 tablet  5   No current facility-administered medications on file prior to visit.    Allergies  Allergen Reactions  . Doxycycline     REACTION: DIZZY/ NAUSEA AND VOMITING  . Esomeprazole Magnesium     REACTION: diarrhea  . Oxycodone-Acetaminophen     REACTION: extemely dizzy with nausea and vomiting.    Past Medical History  Diagnosis Date  . GERD (gastroesophageal reflux disease)   . Mononucleosis     in high school  . Asthma, exercise induced          ?  . Back pain, chronic     Dr. Annell Greening    No past surgical history on file.  No family history on file.  History   Social History  . Marital Status: Married    Spouse Name: N/A    Number of Children: 2  . 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 Smoking status: Never Smoker   . Smokeless  tobacco: Never Used  . Alcohol Use: Yes     Comment: rarely  . Drug Use: No  . Sexual Activity: Not on file   Other Topics Concern  . Not on file   Social History Narrative   Drinks one caffeinated beverage a day.   Review of Systems No fever Some loose stools in the past few days Sores on sides of tongue Gets muscle soreness at times    Objective:   Physical Exam  Constitutional: He appears well-developed and well-nourished. No distress.  Coarse tight cough  HENT:  Nose: Nose normal.  Mouth/Throat: Oropharynx is clear and moist. No oropharyngeal exudate.  TMs normal  Neck: Normal range of motion. Neck supple. No thyromegaly present.  Cardiovascular: Normal rate, regular rhythm and normal heart sounds.  Exam reveals no gallop.   No murmur heard. Pulmonary/Chest: Effort normal and breath sounds normal. No respiratory distress. He has no wheezes. He has no rales. He exhibits no tenderness.  Musculoskeletal: He exhibits no edema and no tenderness.  Lymphadenopathy:    He has no cervical adenopathy.          Assessment & Plan:

## 2013-06-27 ENCOUNTER — Other Ambulatory Visit: Payer: Self-pay

## 2013-07-16 ENCOUNTER — Ambulatory Visit (INDEPENDENT_AMBULATORY_CARE_PROVIDER_SITE_OTHER): Payer: BC Managed Care – PPO | Admitting: Internal Medicine

## 2013-07-16 ENCOUNTER — Encounter: Payer: Self-pay | Admitting: Internal Medicine

## 2013-07-16 VITALS — BP 110/70 | HR 89 | Temp 97.7°F | Wt 188.0 lb

## 2013-07-16 DIAGNOSIS — J45909 Unspecified asthma, uncomplicated: Secondary | ICD-10-CM

## 2013-07-16 DIAGNOSIS — J4530 Mild persistent asthma, uncomplicated: Secondary | ICD-10-CM

## 2013-07-16 MED ORDER — ALBUTEROL SULFATE HFA 108 (90 BASE) MCG/ACT IN AERS: 2.0000 | INHALATION_SPRAY | Freq: Four times a day (QID) | RESPIRATORY_TRACT | Status: DC | PRN

## 2013-07-16 MED ORDER — BECLOMETHASONE DIPROPIONATE 80 MCG/ACT IN AERS: 1.0000 | INHALATION_SPRAY | Freq: Two times a day (BID) | RESPIRATORY_TRACT | Status: DC

## 2013-07-16 NOTE — Assessment & Plan Note (Signed)
Not clear if montelukast helpful at all Is on PPI for reflux  Will add inhaled steroid

## 2013-07-16 NOTE — Progress Notes (Signed)
Pre-visit discussion using our clinic review tool. No additional management support is needed unless otherwise documented below in the visit note.  

## 2013-07-16 NOTE — Progress Notes (Signed)
  Subjective:    Patient ID: Antonio Lara, male    DOB: 1976/06/03, 37 y.o.   MRN: [id number]  HPI Still having daily coughing fits Better than he was Using the albuterol 2 times per day usually On the montelukast also  Congestion in ears Feels dry in nose Some clear to occasional greenish mucus  Occasional SOB--random Trouble with constant phone conversation at work  Current Outpatient Prescriptions on File Prior to Visit  Medication Sig Dispense Refill  . albuterol (PROVENTIL HFA;VENTOLIN HFA) 108 (90 BASE) MCG/ACT inhaler Inhale 2 puffs into the lungs every 6 (six) hours as needed (cough).  1 Inhaler  0  . montelukast (SINGULAIR) 10 MG tablet Take 1 tablet (10 mg total) by mouth at bedtime.  30 tablet  11  . Multiple Vitamin (MULTIVITAMIN) tablet Take 1 tablet by mouth daily.      . pantoprazole (PROTONIX) 40 MG tablet Take 1 tablet (40 mg total) by mouth 2 (two) times daily.  60 tablet  5   No current facility-administered medications on file prior to visit.    Allergies  Allergen Reactions  . Doxycycline     REACTION: DIZZY/ NAUSEA AND VOMITING  . Esomeprazole Magnesium     REACTION: diarrhea  . Oxycodone-Acetaminophen     REACTION: extemely dizzy with nausea and vomiting.    Past Medical History  Diagnosis Date  . GERD (gastroesophageal reflux disease)   . Mononucleosis     in high school  . Asthma, exercise induced          ?  . Back pain, chronic     Dr. Annell Greening    No past surgical history on file.  No family history on file.  History   Social History  . Marital Status: Married    Spouse Name: N/A    Number of Children: 2  . 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 Smoking status: Never Smoker   . Smokeless tobacco: Never Used  . Alcohol Use: Yes     Comment: rarely  . Drug Use: No  . Sexual Activity: Not on file   Other Topics Concern  . Not on file   Social  History Narrative   Drinks one caffeinated beverage a day.   Review of Systems Sleeps okay Rare night cough 2 chinchillas Tries to keep house dust free    Objective:   Physical Exam  Constitutional: He appears well-developed and well-nourished. No distress.  HENT:  Mild nasal inflammation  Neck: Normal range of motion. Neck supple.  Pulmonary/Chest: Effort normal and breath sounds normal. No respiratory distress. He has no wheezes. He has no rales.  Lymphadenopathy:    He has no cervical adenopathy.          Assessment & Plan:

## 2013-08-28 ENCOUNTER — Ambulatory Visit: Payer: BC Managed Care – PPO | Admitting: Family Medicine

## 2014-02-12 ENCOUNTER — Telehealth: Payer: Self-pay | Admitting: *Deleted

## 2014-02-12 MED ORDER — ESOMEPRAZOLE MAGNESIUM 20 MG PO CPDR: 20.0000 mg | DELAYED_RELEASE_CAPSULE | Freq: Every day | ORAL | Status: DC

## 2014-02-12 NOTE — Telephone Encounter (Signed)
Check chart and with patient.

## 2014-02-12 NOTE — Telephone Encounter (Signed)
Spoke with Mr. Antonio Lara.  He states he was seen at an Urgent Care and they switched him to Nexium 20 mg and it works well for him.  [facility].  Medication list updated.

## 2014-02-12 NOTE — Telephone Encounter (Signed)
Received fax from [facility] for refill on Esomeprazole 20 mg, take one capsule by mouth daily.  Not on current medication list. Pantoprazole 40 mg is listed.  Ok to refill?

## 2014-02-20 ENCOUNTER — Encounter: Payer: Self-pay | Admitting: Family Medicine

## 2014-02-20 ENCOUNTER — Ambulatory Visit (INDEPENDENT_AMBULATORY_CARE_PROVIDER_SITE_OTHER): Payer: BC Managed Care – PPO | Admitting: Family Medicine

## 2014-02-20 VITALS — BP 104/76 | HR 88 | Temp 98.4°F | Ht 72.0 in | Wt 191.5 lb

## 2014-02-20 DIAGNOSIS — A088 Other specified intestinal infections: Secondary | ICD-10-CM

## 2014-02-20 DIAGNOSIS — A084 Viral intestinal infection, unspecified: Secondary | ICD-10-CM | POA: Insufficient documentation

## 2014-02-20 MED ORDER — ONDANSETRON HCL 8 MG PO TABS: 8.0000 mg | ORAL_TABLET | Freq: Three times a day (TID) | ORAL | Status: DC | PRN

## 2014-02-20 NOTE — Progress Notes (Signed)
   Subjective:    Patient ID: Antonio Lara, male    DOB: 09-17-1975, 38 y.o.   MRN: [id number]  Sore Throat  Associated symptoms include diarrhea and vomiting. Pertinent negatives include no coughing, ear pain or shortness of breath.  Fever  Associated symptoms include diarrhea and vomiting. Pertinent negatives include no chest pain, coughing, ear pain or wheezing.  Diarrhea  Associated symptoms include a fever and vomiting. Pertinent negatives include no coughing.  Emesis  Associated symptoms include diarrhea and a fever. Pertinent negatives include no chest pain or coughing.    38 year old male pt of Dr. Cyndie Chimeopland's with history of mild persistent asthma presents with new onset myalgia,  diarrhea last 2 days. One episode of emesis last night.  Nasal congestion.  Subjective fever given chills and sweats yesterday.  He did not drink and eat anything yesterday until last night. Frontal headache.   He has sore throat and post nasal drip, mucus drainage in last week.  He has also had stye in right eyelid in last 2 weeks. Improved now.   Today some better today, less soreness, still diarrhea, no nausea and vomiting. He has small amount of diarrhea. Nml UOP today.    Sick contact at work.  He has taken sudafed sinus in last few days.  Review of Systems  Constitutional: Positive for fever.  HENT: Negative for ear pain.   Eyes: Negative for pain.  Respiratory: Negative for cough, shortness of breath and wheezing.   Cardiovascular: Negative for chest pain.  Gastrointestinal: Positive for vomiting and diarrhea.       Objective:   Physical Exam  Constitutional: Vital signs are normal. He appears well-developed and well-nourished.  Fatigued appearing  HENT:  Head: Normocephalic.  Right Ear: Hearing normal.  Left Ear: Hearing normal.  Nose: Nose normal.  Mouth/Throat: Oropharynx is clear and moist and mucous membranes are normal.  Moist mucus membranes  Neck: Trachea  normal. Carotid bruit is not present. No mass and no thyromegaly present.  Cardiovascular: Normal rate, regular rhythm and normal pulses.  Exam reveals no gallop, no distant heart sounds and no friction rub.   No murmur heard. No peripheral edema  Pulmonary/Chest: Effort normal and breath sounds normal. No respiratory distress.  Abdominal: Soft. Bowel sounds are normal. There is no hepatosplenomegaly. There is generalized tenderness. There is no rigidity, no rebound, no guarding and no CVA tenderness.  Musculoskeletal:  Diffuse muscle ttp  Skin: Skin is warm, dry and intact. No rash noted.  Psychiatric: He has a normal mood and affect. His speech is normal and behavior is normal. Thought content normal.          Assessment & Plan:

## 2014-02-20 NOTE — Patient Instructions (Addendum)
Gradually increase fluids. Start at sips every 10 min then increase frequency and amount.  If episode of vomiting, stop liquids for 1 hour then start back gradually increasing up.  Hold on food until later today, bland food or soup. Sooner back to regular diet the better. Can use zofran for nausea if needed. Can use tylenol for body aches and fever. If you cannot keep down liquids or no urine output in 12 hours  Or severe abdominal pain go to ER.

## 2014-02-20 NOTE — Progress Notes (Signed)
Pre visit review using our clinic review tool, if applicable. No additional management support is needed unless otherwise documented below in the visit note. 

## 2014-02-20 NOTE — Assessment & Plan Note (Signed)
No dehydration yet. Counseled on titrating up fluids. Zofran for nausea, tylenol for pain.

## 2014-05-27 ENCOUNTER — Encounter: Payer: Self-pay | Admitting: Family Medicine

## 2014-05-27 ENCOUNTER — Ambulatory Visit (INDEPENDENT_AMBULATORY_CARE_PROVIDER_SITE_OTHER): Payer: BC Managed Care – PPO | Admitting: Family Medicine

## 2014-05-27 VITALS — BP 120/84 | HR 74 | Temp 98.3°F | Ht 72.0 in | Wt 194.0 lb

## 2014-05-27 DIAGNOSIS — J4531 Mild persistent asthma with (acute) exacerbation: Secondary | ICD-10-CM | POA: Insufficient documentation

## 2014-05-27 MED ORDER — GUAIFENESIN-CODEINE 100-10 MG/5ML PO SYRP: 5.0000 mL | ORAL_SOLUTION | Freq: Every evening | ORAL | Status: DC | PRN

## 2014-05-27 MED ORDER — PREDNISONE 20 MG PO TABS: ORAL_TABLET | ORAL | Status: DC

## 2014-05-27 MED ORDER — AZITHROMYCIN 250 MG PO TABS: ORAL_TABLET | ORAL | Status: DC

## 2014-05-27 NOTE — Progress Notes (Signed)
Pre visit review using our clinic review tool, if applicable. No additional management support is needed unless otherwise documented below in the visit note. 

## 2014-05-27 NOTE — Progress Notes (Signed)
   Subjective:    Patient ID: Antonio Lara, male    DOB: 10/15/1975, 38 y.o.   MRN: [id number]  HPI Comments: Sick contacts in family  Cough This is a new problem. The current episode started in the past 7 days. The problem has been gradually worsening. The problem occurs constantly. The cough is productive of sputum and productive of blood-tinged sputum. Associated symptoms include ear congestion, ear pain, nasal congestion, rhinorrhea, a sore throat, shortness of breath, sweats and wheezing. Pertinent negatives include no chills, fever, myalgias or weight loss. Associated symptoms comments: Chest tightness. Nothing aggravates the symptoms. Risk factors: nonsmoker. He has tried a beta-agonist inhaler (albuterol inhaler few times in last day, sudafed) for the symptoms. The treatment provided moderate relief. His past medical history is significant for asthma.      Review of Systems  Constitutional: Negative for fever, chills and weight loss.  HENT: Positive for ear pain, rhinorrhea and sore throat.   Respiratory: Positive for cough, shortness of breath and wheezing.   Musculoskeletal: Negative for myalgias.       Objective:   Physical Exam  Constitutional: Vital signs are normal. He appears well-developed and well-nourished.  Non-toxic appearance. He does not appear ill. No distress.  HENT:  Head: Normocephalic and atraumatic.  Right Ear: Hearing, tympanic membrane, external ear and ear canal normal. No tenderness. No foreign bodies. Tympanic membrane is not retracted and not bulging.  Left Ear: Hearing, tympanic membrane, external ear and ear canal normal. No tenderness. No foreign bodies. Tympanic membrane is not retracted and not bulging.  Nose: Nose normal. No mucosal edema or rhinorrhea. Right sinus exhibits no maxillary sinus tenderness and no frontal sinus tenderness. Left sinus exhibits no maxillary sinus tenderness and no frontal sinus tenderness.  Mouth/Throat: Uvula is  midline, oropharynx is clear and moist and mucous membranes are normal. Normal dentition. No dental caries. No oropharyngeal exudate or tonsillar abscesses.  Eyes: Conjunctivae, EOM and lids are normal. Pupils are equal, round, and reactive to light. Lids are everted and swept, no foreign bodies found.  Neck: Trachea normal, normal range of motion and phonation normal. Neck supple. Carotid bruit is not present. No mass and no thyromegaly present.  Cardiovascular: Normal rate, regular rhythm, S1 normal, S2 normal, normal heart sounds, intact distal pulses and normal pulses.  Exam reveals no gallop.   No murmur heard. Pulmonary/Chest: Effort normal. No respiratory distress. He has wheezes. He has no rhonchi. He has no rales.  Scattered wheeze, constant cough  Abdominal: Soft. Normal appearance and bowel sounds are normal. There is no hepatosplenomegaly. There is no tenderness. There is no rebound, no guarding and no CVA tenderness. No hernia.  Neurological: He is alert. He has normal reflexes.  Skin: Skin is warm, dry and intact. No rash noted.  Psychiatric: He has a normal mood and affect. His speech is normal and behavior is normal. Judgment normal.          Assessment & Plan:

## 2014-05-27 NOTE — Assessment & Plan Note (Signed)
Likely bacterial trigger.  Treat with steroid and albuterol prn.

## 2014-05-27 NOTE — Patient Instructions (Signed)
Rest , fluids. Complete antibiotics. Complete prednisone taper, take in AM. Use albuterol as needed. Call if not improving as expected, go to ER if severe shortness of breath.

## 2014-06-06 ENCOUNTER — Other Ambulatory Visit: Payer: Self-pay

## 2014-06-12 ENCOUNTER — Ambulatory Visit (INDEPENDENT_AMBULATORY_CARE_PROVIDER_SITE_OTHER): Payer: BC Managed Care – PPO | Admitting: Family Medicine

## 2014-06-12 ENCOUNTER — Encounter: Payer: Self-pay | Admitting: Family Medicine

## 2014-06-12 ENCOUNTER — Ambulatory Visit (INDEPENDENT_AMBULATORY_CARE_PROVIDER_SITE_OTHER)
Admission: RE | Admit: 2014-06-12 | Discharge: 2014-06-12 | Disposition: A | Discharge status: Home / Self Care | Payer: BC Managed Care – PPO | Source: Ambulatory Visit | Attending: Family Medicine | Admitting: Family Medicine

## 2014-06-12 VITALS — BP 110/80 | HR 81 | Temp 98.3°F | Ht 72.0 in | Wt 190.0 lb

## 2014-06-12 DIAGNOSIS — R0602 Shortness of breath: Secondary | ICD-10-CM

## 2014-06-12 DIAGNOSIS — J302 Other seasonal allergic rhinitis: Secondary | ICD-10-CM

## 2014-06-12 DIAGNOSIS — J4531 Mild persistent asthma with (acute) exacerbation: Secondary | ICD-10-CM

## 2014-06-12 DIAGNOSIS — H6993 Unspecified Eustachian tube disorder, bilateral: Secondary | ICD-10-CM

## 2014-06-12 DIAGNOSIS — H6983 Other specified disorders of Eustachian tube, bilateral: Secondary | ICD-10-CM

## 2014-06-12 NOTE — Progress Notes (Signed)
Pre visit review using our clinic review tool, if applicable. No additional management support is needed unless otherwise documented below in the visit note. 

## 2014-06-12 NOTE — Patient Instructions (Addendum)
Start flonase 2 apray per nostril daily for 1-2 weeks for ear fullness and sinus pressure.  Nasal saline.  Use albuterol as needed.  No sign of bacterial infection in lungs.  If not continuing to improve over next few weeks, call.

## 2014-06-12 NOTE — Progress Notes (Signed)
   Subjective:    Patient ID: Antonio Lara, male    DOB: [birth year], 38 y.o.   MRN: [id number]  HPI  38 year old male seen on 10/6 for asthma exacerbation. Treated with steroid course, azithromycin and albuterol. He started to get better  but has now worsened in last few days. He continues with cough, productive with green sputum after inhaler. Less cough than before. Chest tightness and shortness of breath.  Nasal congestion and sinus pressure.  No ear pain, B ear fullness, decreased hearing.  He is requiring the albuterol 1-2 times a day.    Review of Systems  Constitutional: Negative for fever, fatigue and unexpected weight change.  HENT: Negative for congestion, ear pain, postnasal drip, rhinorrhea, sore throat and trouble swallowing.   Eyes: Negative for pain.  Respiratory: Negative for cough, shortness of breath and wheezing.   Cardiovascular: Negative for chest pain, palpitations and leg swelling.  Gastrointestinal: Negative for nausea, abdominal pain, diarrhea, constipation and blood in stool.  Genitourinary: Negative for dysuria, urgency, hematuria, discharge, penile swelling, scrotal swelling, difficulty urinating, penile pain and testicular pain.  Skin: Negative for rash.  Neurological: Negative for syncope, weakness, light-headedness, numbness and headaches.  Psychiatric/Behavioral: Negative for behavioral problems and dysphoric mood. The patient is not nervous/anxious.        Objective:   Physical Exam  Constitutional: Vital signs are normal. He appears well-developed and well-nourished.  Non-toxic appearance. He does not appear ill. No distress.  HENT:  Head: Normocephalic and atraumatic.  Right Ear: Hearing, external ear and ear canal normal. No tenderness. No foreign bodies. Tympanic membrane is not retracted and not bulging. A middle ear effusion is present.  Left Ear: Hearing, external ear and ear canal normal. No tenderness. No foreign bodies. Tympanic  membrane is not retracted and not bulging. A middle ear effusion is present.  Nose: Mucosal edema present. No rhinorrhea. Right sinus exhibits no maxillary sinus tenderness and no frontal sinus tenderness. Left sinus exhibits no maxillary sinus tenderness and no frontal sinus tenderness.  Mouth/Throat: Uvula is midline, oropharynx is clear and moist and mucous membranes are normal. Normal dentition. No dental caries. No oropharyngeal exudate or tonsillar abscesses.  Eyes: Conjunctivae, EOM and lids are normal. Pupils are equal, round, and reactive to light. Lids are everted and swept, no foreign bodies found.  Neck: Trachea normal, normal range of motion and phonation normal. Neck supple. Carotid bruit is not present. No mass and no thyromegaly present.  Cardiovascular: Normal rate, regular rhythm, S1 normal, S2 normal, normal heart sounds, intact distal pulses and normal pulses.  Exam reveals no gallop.   No murmur heard. Pulmonary/Chest: Effort normal and breath sounds normal. No respiratory distress. He has no wheezes. He has no rhonchi. He has no rales.  Abdominal: Soft. Normal appearance and bowel sounds are normal. There is no hepatosplenomegaly. There is no tenderness. There is no rebound, no guarding and no CVA tenderness. No hernia.  Neurological: He is alert. He has normal reflexes.  Skin: Skin is warm, dry and intact. No rash noted.  Psychiatric: He has a normal mood and affect. His speech is normal and behavior is normal. Judgment normal.          Assessment & Plan:

## 2014-06-18 DIAGNOSIS — H6980 Other specified disorders of Eustachian tube, unspecified ear: Secondary | ICD-10-CM | POA: Insufficient documentation

## 2014-06-18 NOTE — Assessment & Plan Note (Signed)
CXR clear. Likely improving asthma flare and nasal congestion.,  Peak flows in nml range... Therefore no indication for repeat prednisone taper.  Use albuterol as needed.

## 2014-06-18 NOTE — Assessment & Plan Note (Signed)
Nasal steroid

## 2014-06-18 NOTE — Assessment & Plan Note (Signed)
Start flonase 2 apray per nostril daily for 1-2 weeks for ear fullness and sinus pressure.  Nasal saline.

## 2014-09-08 ENCOUNTER — Ambulatory Visit (INDEPENDENT_AMBULATORY_CARE_PROVIDER_SITE_OTHER): Payer: BLUE CROSS/BLUE SHIELD | Admitting: Family Medicine

## 2014-09-08 ENCOUNTER — Encounter: Payer: Self-pay | Admitting: Family Medicine

## 2014-09-08 VITALS — BP 110/62 | HR 80 | Temp 98.6°F | Ht 72.0 in | Wt 190.8 lb

## 2014-09-08 DIAGNOSIS — M5417 Radiculopathy, lumbosacral region: Secondary | ICD-10-CM

## 2014-09-08 DIAGNOSIS — M5416 Radiculopathy, lumbar region: Secondary | ICD-10-CM

## 2014-09-08 MED ORDER — BECLOMETHASONE DIPROPIONATE 80 MCG/ACT IN AERS: 1.0000 | INHALATION_SPRAY | Freq: Two times a day (BID) | RESPIRATORY_TRACT | Status: DC

## 2014-09-08 MED ORDER — TIZANIDINE HCL 4 MG PO TABS: 4.0000 mg | ORAL_TABLET | Freq: Every evening | ORAL | Status: DC

## 2014-09-08 MED ORDER — PREDNISONE 20 MG PO TABS: ORAL_TABLET | ORAL | Status: DC

## 2014-09-08 MED ORDER — HYDROCODONE-ACETAMINOPHEN 5-325 MG PO TABS: 1.0000 | ORAL_TABLET | Freq: Four times a day (QID) | ORAL | Status: DC | PRN

## 2014-09-08 NOTE — Progress Notes (Signed)
Dr. Karleen HampshireSpencer T. Toshiyuki Fredell, MD, CAQ [facility] [address] [place], [id number] Phone: [phone] Fax: [phone]  09/08/2014  Patient: Antonio Lara, MRN: [id number], DOB: May 27, 1976, 39 y.o.  Primary Physician:  Hannah BeatSpencer Chloeann Alfred, MD  Chief Complaint: Back Pain  Subjective:   Antonio Lara is a 39 y.o. very pleasant male patient who presents with the following: Back Pain  ongoing for approximately: 2 days ago The patient has had back pain before. The back pain is localized into the lumbar spine area. They also describe R radiculopathy.  Couple of days ago, moving some things - severe pain in the lumbar region, and then went to the chiropractor.   ? Disc herniation, and while at work, and now moving at work and favoring right side and radiating down the foo. A little better at night. Gets a little worse at night.   Took 2 alleve at night time. And a muscle relaxer at night. Sometimes 10/10, sometimes are ok annd 4/10.   No numbness or tingling. No bowel or bladder incontinence. No focal weakness. Prior interventions: manipulation, no h/o surgery Physical therapy: No Chiropractic manipulations: No Acupuncture: No Osteopathic manipulation: No Heat or cold: Minimal effect  Past Medical History, Surgical History, Family History, Medications, Allergies have been reviewed and updated if relevant.  GEN: No fevers, chills. Nontoxic. Primarily MSK c/o today. MSK: Detailed in the HPI GI: tolerating PO intake without difficulty Neuro: As above  Otherwise the pertinent positives of the ROS are noted above.    Objective:   Blood pressure 110/62, pulse 80, temperature 98.6 F (37 C), temperature source Oral, height 6' (1.829 m), weight 190 lb 12 oz (86.524 kg).  Gen: Well-developed,well-nourished,in no acute distress; alert,appropriate and cooperative throughout examination HEENT: Normocephalic and atraumatic without obvious  abnormalities.  Ears, externally no deformities Pulm: Breathing comfortably in no respiratory distress Range of motion at  the waist: Flexion, rotation and lateral bending: relatively preserved.  Rotational movement and lateral bending causes pain.  No echymosis or edema Rises to examination table with no difficulty Gait: minimally antalgic  Inspection/Deformity: No abnormality Paraspinus T:  Diffusely tender, L2-S1.  B Ankle Dorsiflexion (L5,4): 5/5 B Great Toe Dorsiflexion (L5,4): 5/5 Heel Walk (L5): WNL Toe Walk (S1): WNL Rise/Squat (L4): WNL, mild pain  SENSORY B Medial Foot (L4): WNL B Dorsum (L5): WNL B Lateral (S1): WNL Light Touch: WNL Pinprick: WNL  REFLEXES Knee (L4): 2+ Ankle (S1): 2+  B SLR, seated: neg B SLR, supine: neg B FABER: neg B Reverse FABER: neg B Greater Troch: NT B Log Roll: neg B Stork: NT B Sciatic Notch: TTP  Radiology: No results found.  Assessment and Plan:   Lumbar back pain with radiculopathy affecting right lower extremity  Suspect most likely disc herniation affecting right-sided nerve root.  No weakness.  No numbness.  Radicular symptoms.  Continue with conservative management.  10 days of prednisone, Zanaflex at nighttime.  Pain medications and needed.  Weight restrictions at work.  If no improvement in 3 or 4 weeks, follow-up.  New Prescriptions   HYDROCODONE-ACETAMINOPHEN (NORCO/VICODIN) 5-325 MG PER TABLET    Take 1 tablet by mouth every 6 (six) hours as needed for moderate pain.   PREDNISONE (DELTASONE) 20 MG TABLET    2 tabs po for 5 days, then 1 tab po for 5 days   TIZANIDINE (ZANAFLEX) 4 MG TABLET    Take 1 tablet (4 mg total) by mouth Nightly.  No orders of the defined types were placed in this encounter.    Signed,  Elpidio Galea. Nell Gales, MD   Patient's Medications  New Prescriptions   HYDROCODONE-ACETAMINOPHEN (NORCO/VICODIN) 5-325 MG PER TABLET    Take 1 tablet by mouth every 6 (six) hours as needed for  moderate pain.   PREDNISONE (DELTASONE) 20 MG TABLET    2 tabs po for 5 days, then 1 tab po for 5 days   TIZANIDINE (ZANAFLEX) 4 MG TABLET    Take 1 tablet (4 mg total) by mouth Nightly.  Previous Medications   ALBUTEROL (PROVENTIL HFA;VENTOLIN HFA) 108 (90 BASE) MCG/ACT INHALER    Inhale 2 puffs into the lungs every 6 (six) hours as needed (cough).   ESOMEPRAZOLE (NEXIUM) 20 MG CAPSULE    Take 1 capsule (20 mg total) by mouth daily.   LORATADINE (CLARITIN) 10 MG TABLET    Take 10 mg by mouth daily.   MULTIPLE VITAMIN (MULTIVITAMIN) TABLET    Take 1 tablet by mouth daily.  Modified Medications   Modified Medication Previous Medication   BECLOMETHASONE (QVAR) 80 MCG/ACT INHALER beclomethasone (QVAR) 80 MCG/ACT inhaler      Inhale 1 puff into the lungs 2 (two) times daily.    Inhale 1 puff into the lungs 2 (two) times daily.  Discontinued Medications   CETIRIZINE (ZYRTEC) 10 MG TABLET    Take 10 mg by mouth daily.   GUAIFENESIN-CODEINE (ROBITUSSIN AC) 100-10 MG/5ML SYRUP    Take 5-10 mLs by mouth at bedtime as needed for cough.

## 2014-09-08 NOTE — Progress Notes (Signed)
Pre visit review using our clinic review tool, if applicable. No additional management support is needed unless otherwise documented below in the visit note. 

## 2014-09-23 ENCOUNTER — Ambulatory Visit (INDEPENDENT_AMBULATORY_CARE_PROVIDER_SITE_OTHER)
Admission: RE | Admit: 2014-09-23 | Discharge: 2014-09-23 | Disposition: A | Discharge status: Home / Self Care | Payer: BLUE CROSS/BLUE SHIELD | Source: Ambulatory Visit | Attending: Family Medicine | Admitting: Family Medicine

## 2014-09-23 ENCOUNTER — Encounter: Payer: Self-pay | Admitting: Family Medicine

## 2014-09-23 ENCOUNTER — Ambulatory Visit (INDEPENDENT_AMBULATORY_CARE_PROVIDER_SITE_OTHER): Payer: BLUE CROSS/BLUE SHIELD | Admitting: Family Medicine

## 2014-09-23 VITALS — BP 114/80 | HR 77 | Temp 98.2°F | Ht 72.0 in | Wt 189.0 lb

## 2014-09-23 DIAGNOSIS — M545 Low back pain, unspecified: Secondary | ICD-10-CM

## 2014-09-23 DIAGNOSIS — M546 Pain in thoracic spine: Secondary | ICD-10-CM | POA: Insufficient documentation

## 2014-09-23 DIAGNOSIS — M549 Dorsalgia, unspecified: Secondary | ICD-10-CM

## 2014-09-23 DIAGNOSIS — J019 Acute sinusitis, unspecified: Secondary | ICD-10-CM | POA: Insufficient documentation

## 2014-09-23 DIAGNOSIS — J0100 Acute maxillary sinusitis, unspecified: Secondary | ICD-10-CM

## 2014-09-23 MED ORDER — PREDNISONE 20 MG PO TABS: ORAL_TABLET | ORAL | Status: DC

## 2014-09-23 MED ORDER — AMOXICILLIN 500 MG PO CAPS: 1000.0000 mg | ORAL_CAPSULE | Freq: Two times a day (BID) | ORAL | Status: DC

## 2014-09-23 NOTE — Assessment & Plan Note (Signed)
Likely muscle strain but give ttp over vertebrae will eval with  X-ray.

## 2014-09-23 NOTE — Assessment & Plan Note (Addendum)
X-ray given persistent pain and vertebral ttp.  Continue home physical therapy for back, can use heat on low back. Repeat prednisone course but higher dose and a little longer. Follow up with PCP if not improving significantly in 2 weeks.

## 2014-09-23 NOTE — Patient Instructions (Signed)
We will call with X-ray results.  Continue home physical therapy for back, can use heat on low back. Repeat prednisone course but higher dose and a little longer. Follow up with PCP if not improving significantly in 2 weeks.  Nasal saline irrigation. Mucinex D to break up mucus. Complete course of antibiotics.

## 2014-09-23 NOTE — Progress Notes (Signed)
Subjective:    Patient ID: Antonio Lara, male    DOB: [birth year], 39 y.o.   MRN: [id number]  HPI    39 year old male pt of Dr. Cyndie Chimeopland's presents with history of lumbar back pain presents with multiple issues.  The most important of theses that will be addressed today are low back pain and sinus issues.  Saw Dr. Brayton Laymanoplands on 09/08/2014 regarding low back pain radiating to right leg.  10 days ( 40/20)prednisone  helped a lot. Used only some hydrocodone.He was 60% better until... Had been doing core muscle strengthening exercises.  2 days ago he had sudden shooting pain in B low back, buttock and  Bilateral legs.  Felt stiff. No known injury, he had been moving TVs.   Pain is better now, but still has 50% pain. No new numbness, no weakness.   No fever. No urinary changes.  No past back surgeries, no steroid injections in past.   He has been blowing nose, congestion, thick green mucus, bloody discharge. He has had symptoms in last 15 days, gradually getting worse.  Stable SOB, no changein wheeze,no fever, post nasal drip. Mild occ. No ear pain, she does have pressure in face.  Has not had to use albuterol. Has not used any OTC med.          Review of Systems  Constitutional: Negative for fever, fatigue and unexpected weight change.  HENT: Positive for postnasal drip and rhinorrhea. Negative for congestion, ear pain, sore throat and trouble swallowing.   Eyes: Negative for pain.  Respiratory: Positive for cough. Negative for shortness of breath and wheezing.   Cardiovascular: Negative for chest pain, palpitations and leg swelling.  Gastrointestinal: Negative for nausea, abdominal pain, diarrhea, constipation and blood in stool.  Genitourinary: Negative for dysuria, urgency, hematuria, discharge, penile swelling, scrotal swelling, difficulty urinating, penile pain and testicular pain.  Skin: Negative for rash.  Neurological: Negative for syncope, weakness,  light-headedness, numbness and headaches.  Psychiatric/Behavioral: Negative for behavioral problems and dysphoric mood. The patient is not nervous/anxious.        Objective:   Physical Exam  Constitutional: Vital signs are normal. He appears well-developed and well-nourished.  HENT:  Head: Normocephalic.  Right Ear: Hearing normal. A middle ear effusion is present.  Left Ear: Hearing normal. A middle ear effusion is present.  Nose: Mucosal edema and rhinorrhea present. Right sinus exhibits no maxillary sinus tenderness. Left sinus exhibits maxillary sinus tenderness.  Mouth/Throat: Oropharynx is clear and moist and mucous membranes are normal. No posterior oropharyngeal edema or posterior oropharyngeal erythema.  Neck: Trachea normal. Carotid bruit is not present. No thyroid mass and no thyromegaly present.  Cardiovascular: Normal rate, regular rhythm and normal pulses.  Exam reveals no gallop, no distant heart sounds and no friction rub.   No murmur heard. No peripheral edema  Pulmonary/Chest: Effort normal and breath sounds normal. No respiratory distress.  Musculoskeletal:       Thoracic back: He exhibits decreased range of motion, tenderness and bony tenderness.       Lumbar back: He exhibits decreased range of motion, tenderness and bony tenderness.  Neg SLR, neg faber's  Neurological: He has normal strength. No cranial nerve deficit or sensory deficit. He exhibits normal muscle tone. Coordination and gait normal.  Skin: Skin is warm, dry and intact. No rash noted.  Psychiatric: He has a normal mood and affect. His speech is normal and behavior is normal. Thought content normal.  Assessment & Plan:

## 2014-09-23 NOTE — Progress Notes (Signed)
Pre visit review using our clinic review tool, if applicable. No additional management support is needed unless otherwise documented below in the visit note. 

## 2014-09-23 NOTE — Assessment & Plan Note (Signed)
Nasal saline irrigation. Mucinex D to break up mucus. Complete course of antibiotics given > 2 weeks of symptoms, and bloody purulent discharge.

## 2014-11-04 ENCOUNTER — Other Ambulatory Visit: Payer: Self-pay | Admitting: Family Medicine

## 2014-12-04 ENCOUNTER — Encounter: Payer: Self-pay | Admitting: Internal Medicine

## 2014-12-04 ENCOUNTER — Ambulatory Visit (INDEPENDENT_AMBULATORY_CARE_PROVIDER_SITE_OTHER): Payer: BLUE CROSS/BLUE SHIELD | Admitting: Internal Medicine

## 2014-12-04 VITALS — BP 118/72 | HR 84 | Temp 98.6°F | Wt 191.0 lb

## 2014-12-04 DIAGNOSIS — M545 Low back pain, unspecified: Secondary | ICD-10-CM

## 2014-12-04 DIAGNOSIS — S46811A Strain of other muscles, fascia and tendons at shoulder and upper arm level, right arm, initial encounter: Secondary | ICD-10-CM

## 2014-12-04 MED ORDER — METHOCARBAMOL 500 MG PO TABS: 500.0000 mg | ORAL_TABLET | Freq: Three times a day (TID) | ORAL | Status: DC

## 2014-12-04 NOTE — Progress Notes (Addendum)
Subjective:    Patient ID: Antonio Lara, male    DOB: April 12, 1976, 39 y.o.   MRN: [id number]  HPI  Pt presents to the clinic today with c/o low back pain and stiffness. This has been going on for the last week. He did go to the chiropractor 3 days ago. He did get some relief but shortly after, the pain returned. He describes the pain as sharp and burning. He does have muscle tension in his lower back. The pain does not radiate down his legs. He also has some pain in his neck. He noticed this pain this morning after he got out of the shower. The pain in his neck is sharp as well. He has taken norco, zanaflex, aleve and stretching without much relief. He actually feels like the stretching exercises make it worse.  Of note, He has been dealing with this intermittently since 08/2014. He saw Dr. Patsy Lager 09/08/14 for the same. He was treated with prednisone, zanaflex and norco. He did get good relief but had recurrent back pain 09/23/14. He was seen by Dr. Ermalene Searing at that time. She repeated his prednisone and gave him PT exercises to do at home. She also obtained an xray of thoracic and lumbar spine at that time which were both normal.  Review of Systems  Past Medical History  Diagnosis Date  . GERD (gastroesophageal reflux disease)   . Mononucleosis     in high school  . Asthma, exercise induced          ?  . Back pain, chronic     Dr. Annell Greening    Current Outpatient Prescriptions  Medication Sig Dispense Refill  . albuterol (PROVENTIL HFA;VENTOLIN HFA) 108 (90 BASE) MCG/ACT inhaler Inhale 2 puffs into the lungs every 6 (six) hours as needed (cough). 1 Inhaler 1  . beclomethasone (QVAR) 80 MCG/ACT inhaler Inhale 1 puff into the lungs 2 (two) times daily. 1 Inhaler 12  . esomeprazole (NEXIUM) 20 MG capsule TAKE ONE CAPSULE BY MOUTH DAILY 30 capsule 5  . HYDROcodone-acetaminophen (NORCO/VICODIN) 5-325 MG per tablet Take 1 tablet by mouth every 6 (six) hours as needed for moderate pain. 20  tablet 0  . loratadine (CLARITIN) 10 MG tablet Take 10 mg by mouth daily.    . Multiple Vitamin (MULTIVITAMIN) tablet Take 1 tablet by mouth daily.    Marland Kitchen tiZANidine (ZANAFLEX) 4 MG tablet Take 1 tablet (4 mg total) by mouth Nightly. 30 tablet 2   No current facility-administered medications for this visit.    Allergies  Allergen Reactions  . Oxycodone-Acetaminophen     REACTION: extemely dizzy with nausea and vomiting.    No family history on file.  History   Social History  . Marital Status: Married    Spouse Name: N/A  . Number of Children: 2  . 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 Smoking status: Never Smoker   . Smokeless tobacco: Never Used  . Alcohol Use: Yes     Comment: rarely  . Drug Use: No  . Sexual Activity: Not on file   Other Topics Concern  . Not on file   Social History Narrative   Drinks one caffeinated beverage a day.     Constitutional: Denies fever, malaise, fatigue, headache or abrupt weight changes.  Respiratory: Denies difficulty breathing, shortness of breath, cough or sputum production.   Cardiovascular: Denies chest pain, chest tightness,  palpitations or swelling in the hands or feet.  Gastrointestinal: Denies abdominal pain, bloating, constipation, diarrhea or blood in the stool.  GU: Denies urgency, frequency, pain with urination, burning sensation, blood in urine, odor or discharge. Musculoskeletal: Pt reports back pain and neck pain. Denies difficulty with gait or joint pain and swelling.    No other specific complaints in a complete review of systems (except as listed in HPI above).     Objective:   Physical Exam  BP 118/72 mmHg  Pulse 84  Temp(Src) 98.6 F (37 C) (Oral)  Wt 191 lb (86.637 kg)  SpO2 98% Wt Readings from Last 3 Encounters:  12/04/14 191 lb (86.637 kg)  09/23/14 189 lb (85.73 kg)  09/08/14 190 lb 12 oz (86.524 kg)    General: Appears  his stated age, well developed, well nourished in NAD. Cardiovascular: Normal rate and rhythm. S1,S2 noted.  No murmur, rubs or gallops noted.  Pulmonary/Chest: Normal effort and positive vesicular breath sounds. No respiratory distress. No wheezes, rales or ronchi noted.  Musculoskeletal: Normal internal and external rotation of the right shoulder. Normal flexion, extension and rotation of the cervical spine, thoracic and lumbar spine. Strength 5/5 BUE/BLE. Tenderness to palpation in th right trapezius and right and left paralumbar muscles. Neurological: Alert and oriented.       Assessment & Plan:   Low back pain secondary to muscle strain, muscle strain of right trapezius:  He aksed me to refill his prednisone- I declined, he has had too much recently Advised him to keep trying the stretching exercise and Aleve He has tried PT in the past and is not interested in repeating this Will switch Zanaflex to Robaxin Will discuss with Dr. Patsy Lageropland to see if he is okay with me ordering MRI of the lumbar spine- Spoke with Dr. Patsy Lageropland he is okay with MRI of lumbar spine Consider massage therapy  RTC as needed or if symptoms persist or worsen

## 2014-12-04 NOTE — Patient Instructions (Signed)
Back Exercises These exercises may help you when beginning to rehabilitate your injury. Your symptoms may resolve with or without further involvement from your physician, physical therapist or athletic trainer. While completing these exercises, remember:   Restoring tissue flexibility helps normal motion to return to the joints. This allows healthier, less painful movement and activity.  An effective stretch should be held for at least 30 seconds.  A stretch should never be painful. You should only feel a gentle lengthening or release in the stretched tissue. STRETCH - Extension, Prone on Elbows   Lie on your stomach on the floor, a bed will be too soft. Place your palms about shoulder width apart and at the height of your head.  Place your elbows under your shoulders. If this is too painful, stack pillows under your chest.  Allow your body to relax so that your hips drop lower and make contact more completely with the floor.  Hold this position for __________ seconds.  Slowly return to lying flat on the floor. Repeat __________ times. Complete this exercise __________ times per day.  RANGE OF MOTION - Extension, Prone Press Ups   Lie on your stomach on the floor, a bed will be too soft. Place your palms about shoulder width apart and at the height of your head.  Keeping your back as relaxed as possible, slowly straighten your elbows while keeping your hips on the floor. You may adjust the placement of your hands to maximize your comfort. As you gain motion, your hands will come more underneath your shoulders.  Hold this position __________ seconds.  Slowly return to lying flat on the floor. Repeat __________ times. Complete this exercise __________ times per day.  RANGE OF MOTION- Quadruped, Neutral Spine   Assume a hands and knees position on a firm surface. Keep your hands under your shoulders and your knees under your hips. You may place padding under your knees for  comfort.  Drop your head and point your tail bone toward the ground below you. This will round out your low back like an angry cat. Hold this position for __________ seconds.  Slowly lift your head and release your tail bone so that your back sags into a large arch, like an old horse.  Hold this position for __________ seconds.  Repeat this until you feel limber in your low back.  Now, find your "sweet spot." This will be the most comfortable position somewhere between the two previous positions. This is your neutral spine. Once you have found this position, tense your stomach muscles to support your low back.  Hold this position for __________ seconds. Repeat __________ times. Complete this exercise __________ times per day.  STRETCH - Flexion, Single Knee to Chest   Lie on a firm bed or floor with both legs extended in front of you.  Keeping one leg in contact with the floor, bring your opposite knee to your chest. Hold your leg in place by either grabbing behind your thigh or at your knee.  Pull until you feel a gentle stretch in your low back. Hold __________ seconds.  Slowly release your grasp and repeat the exercise with the opposite side. Repeat __________ times. Complete this exercise __________ times per day.  STRETCH - Hamstrings, Standing  Stand or sit and extend your right / left leg, placing your foot on a chair or foot stool  Keeping a slight arch in your low back and your hips straight forward.  Lead with your chest and   lean forward at the waist until you feel a gentle stretch in the back of your right / left knee or thigh. (When done correctly, this exercise requires leaning only a small distance.)  Hold this position for __________ seconds. Repeat __________ times. Complete this stretch __________ times per day. STRENGTHENING - Deep Abdominals, Pelvic Tilt   Lie on a firm bed or floor. Keeping your legs in front of you, bend your knees so they are both pointed  toward the ceiling and your feet are flat on the floor.  Tense your lower abdominal muscles to press your low back into the floor. This motion will rotate your pelvis so that your tail bone is scooping upwards rather than pointing at your feet or into the floor.  With a gentle tension and even breathing, hold this position for __________ seconds. Repeat __________ times. Complete this exercise __________ times per day.  STRENGTHENING - Abdominals, Crunches   Lie on a firm bed or floor. Keeping your legs in front of you, bend your knees so they are both pointed toward the ceiling and your feet are flat on the floor. Cross your arms over your chest.  Slightly tip your chin down without bending your neck.  Tense your abdominals and slowly lift your trunk high enough to just clear your shoulder blades. Lifting higher can put excessive stress on the low back and does not further strengthen your abdominal muscles.  Control your return to the starting position. Repeat __________ times. Complete this exercise __________ times per day.  STRENGTHENING - Quadruped, Opposite UE/LE Lift   Assume a hands and knees position on a firm surface. Keep your hands under your shoulders and your knees under your hips. You may place padding under your knees for comfort.  Find your neutral spine and gently tense your abdominal muscles so that you can maintain this position. Your shoulders and hips should form a rectangle that is parallel with the floor and is not twisted.  Keeping your trunk steady, lift your right hand no higher than your shoulder and then your left leg no higher than your hip. Make sure you are not holding your breath. Hold this position __________ seconds.  Continuing to keep your abdominal muscles tense and your back steady, slowly return to your starting position. Repeat with the opposite arm and leg. Repeat __________ times. Complete this exercise __________ times per day. Document Released:  08/26/2005 Document Revised: 10/31/2011 Document Reviewed: 11/20/2008 Exit[employer] Patient Information 2015 [employer]. This information is not intended to replace advice given to you by your health care provider. Make sure you discuss any questions you have with your health care provider.  

## 2014-12-04 NOTE — Addendum Note (Signed)
Addended by: Lorre MunroeBAITY, Hezekiah Veltre W on: 12/04/2014 10:28 AM   Modules accepted: Orders

## 2014-12-04 NOTE — Progress Notes (Signed)
Pre visit review using our clinic review tool, if applicable. No additional management support is needed unless otherwise documented below in the visit note. 

## 2014-12-19 ENCOUNTER — Ambulatory Visit
Admit: 2014-12-19 | Disposition: A | Discharge status: Home / Self Care | Payer: Self-pay | Attending: Internal Medicine | Admitting: Internal Medicine

## 2015-02-27 ENCOUNTER — Encounter: Payer: Self-pay | Admitting: Gastroenterology

## 2015-05-21 ENCOUNTER — Encounter: Payer: Self-pay | Admitting: Family Medicine

## 2015-05-21 ENCOUNTER — Ambulatory Visit (INDEPENDENT_AMBULATORY_CARE_PROVIDER_SITE_OTHER): Payer: BLUE CROSS/BLUE SHIELD | Admitting: Family Medicine

## 2015-05-21 VITALS — BP 100/80 | HR 83 | Temp 98.8°F | Ht 72.0 in | Wt 191.5 lb

## 2015-05-21 DIAGNOSIS — M5020 Other cervical disc displacement, unspecified cervical region: Secondary | ICD-10-CM | POA: Diagnosis not present

## 2015-05-21 DIAGNOSIS — J4531 Mild persistent asthma with (acute) exacerbation: Secondary | ICD-10-CM

## 2015-05-21 DIAGNOSIS — M542 Cervicalgia: Secondary | ICD-10-CM | POA: Diagnosis not present

## 2015-05-21 DIAGNOSIS — M5010 Cervical disc disorder with radiculopathy, unspecified cervical region: Secondary | ICD-10-CM

## 2015-05-21 MED ORDER — BECLOMETHASONE DIPROPIONATE 80 MCG/ACT IN AERS: 1.0000 | INHALATION_SPRAY | Freq: Two times a day (BID) | RESPIRATORY_TRACT | Status: DC

## 2015-05-21 MED ORDER — PREDNISONE 20 MG PO TABS: ORAL_TABLET | ORAL | Status: DC

## 2015-05-21 MED ORDER — ALBUTEROL SULFATE HFA 108 (90 BASE) MCG/ACT IN AERS: 2.0000 | INHALATION_SPRAY | Freq: Four times a day (QID) | RESPIRATORY_TRACT | Status: DC | PRN

## 2015-05-21 NOTE — Progress Notes (Signed)
Dr. Karleen Hampshire T. Copland, MD, CAQ Sports Medicine Primary Care and Sports Medicine [address] [place], [address] Phone: [phone] Fax: [phone]  05/21/2015  Patient: Antonio Lara, MRN: [id number], DOB: 1976/08/07, 39 y.o.  Primary Physician:  Hannah Beat, MD  Chief Complaint: Asthma and Neck/Shoulder Pain  Subjective:   Antonio Lara is a 39 y.o. very pleasant male patient who presents with the following:  Several issues:  Past couple of months, when weather changing summer to fall, started to have some asthma flair-ups.  Using some Qvar - some short of breath. Off and on - winded walking to the light switch.   Not Qvar now. Not taking.   B arm pain and radiating pain down his arms. Decreased ROM of the neck. Neck tight historically.  Neck has gotten worse and worse.  Going to the chiropractor now. Doing traction and manipulation which has helped some.  ? Bulging disc.   Biceps weak on L  Past Medical History, Surgical History, Social History, Family History, Problem List, Medications, and Allergies have been reviewed and updated if relevant.  Patient Active Problem List   Diagnosis Date Noted  . Mild persistent asthma in adult without complication 06/14/2013  . GERD 07/10/2009  . BACK PAIN, LUMBAR 06/10/2008  . Allergic rhinitis 04/08/2008    Past Medical History  Diagnosis Date  . GERD (gastroesophageal reflux disease)   . Mononucleosis     in high school  . Asthma, exercise induced          ?  . Back pain, chronic     Dr. Annell Greening    No past surgical history on file.  Social History   Social History  . Marital Status: Single    Spouse Name: N/A  . Number of Children: 2  . 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 Smoking status: Never Smoker   . Smokeless tobacco: Never Used  . Alcohol Use: Yes     Comment: rarely  . Drug Use: No  . Sexual Activity:  Not on file   Other Topics Concern  . Not on file   Social History Narrative   Drinks one caffeinated beverage a day.    No family history on file.  Allergies  Allergen Reactions  . Oxycodone-Acetaminophen     REACTION: extemely dizzy with nausea and vomiting.    Medication list reviewed and updated in full in [facility].  GEN: no acute illness or fever CV: No chest pain  MSK: detailed above Neuro: neurological signs are described above ROS O/w per HPI  Objective:   BP 100/80 mmHg  Pulse 83  Temp(Src) 98.8 F (37.1 C) (Oral)  Ht 6' (1.829 m)  Wt 191 lb 8 oz (86.864 kg)  BMI 25.97 kg/m2  SpO2 98%   GEN: Well-developed,well-nourished,in no acute distress; alert,appropriate and cooperative throughout examination HEENT: Normocephalic and atraumatic without obvious abnormalities. Ears, externally no deformities CV: RRR, no m/g/r  PULM: Normal respiratory rate, no accessory muscle use. No wheezes, crackles or rhonchi  EXT: No clubbing, cyanosis, or edema PSYCH: Normally interactive. Cooperative during the interview. Pleasant. Friendly and conversant. Not anxious or depressed appearing. Normal, full affect.  CERVICAL SPINE EXAM Range of motion: Flexion, extension, lateral bending, and rotation: 30% loss in each direction Pain with terminal motion: yes Spinous Processes: NT SCM: NT Upper paracervical muscles: TTP Upper traps: TTP, more  on the R C5-T1 intact, sensation.   From a motor standpoint, weaker on L bicep function compared to R. Approx 4/5  Radiology: No results found.  Assessment and Plan:   Brachial neuritis or radiculitis due to rupture of cervical intervertebral disc  Cervicalgia  Mild persistent asthma with acute exacerbation in adult  For asthma, presumed inhaled corticosteroid.  Refilled his rescue inhaler. Acute exacerbation, I'm going to give him some prednisone for his asthma in addition to his cervical radiculopathy.  Bilateral  intermittent cervical radiculopathy.  Intermittent neck pain.  He has been doing manipulation and traction, which I think is a good idea as long as the manipulation doesn't make his neurological symptoms worse.  Right now he does have some weakness on his biceps on the left.  We talked about different ways to treat this, and he and I think that given this an additional conservative challenge and some additional time is certainly reasonable.  10 days of prednisone.  Continue with conservative care and chiropractic management.  If he is still symptomatic and 5 or 6 weeks, and I asked him to come back and we will explore this in greater depth.  New Prescriptions   PREDNISONE (DELTASONE) 20 MG TABLET    2 tabs po for 5 days, then 1 po for 5 days   No orders of the defined types were placed in this encounter.    Signed,  Elpidio Galea. Copland, MD   Patient's Medications  New Prescriptions   PREDNISONE (DELTASONE) 20 MG TABLET    2 tabs po for 5 days, then 1 po for 5 days  Previous Medications   ESOMEPRAZOLE (NEXIUM) 20 MG CAPSULE    TAKE ONE CAPSULE BY MOUTH DAILY   LORATADINE (CLARITIN) 10 MG TABLET    Take 10 mg by mouth daily.   MULTIPLE VITAMIN (MULTIVITAMIN) TABLET    Take 1 tablet by mouth daily.  Modified Medications   Modified Medication Previous Medication   ALBUTEROL (PROVENTIL HFA;VENTOLIN HFA) 108 (90 BASE) MCG/ACT INHALER albuterol (PROVENTIL HFA;VENTOLIN HFA) 108 (90 BASE) MCG/ACT inhaler      Inhale 2 puffs into the lungs every 6 (six) hours as needed (cough).    Inhale 2 puffs into the lungs every 6 (six) hours as needed (cough).   BECLOMETHASONE (QVAR) 80 MCG/ACT INHALER beclomethasone (QVAR) 80 MCG/ACT inhaler      Inhale 1 puff into the lungs 2 (two) times daily.    Inhale 1 puff into the lungs 2 (two) times daily.  Discontinued Medications   HYDROCODONE-ACETAMINOPHEN (NORCO/VICODIN) 5-325 MG PER TABLET    Take 1 tablet by mouth every 6 (six) hours as needed for moderate  pain.   METHOCARBAMOL (ROBAXIN) 500 MG TABLET    Take 1 tablet (500 mg total) by mouth 3 (three) times daily.   TIZANIDINE (ZANAFLEX) 4 MG TABLET    Take 1 tablet (4 mg total) by mouth Nightly.

## 2015-05-21 NOTE — Progress Notes (Signed)
Pre visit review using our clinic review tool, if applicable. No additional management support is needed unless otherwise documented below in the visit note. 

## 2015-06-04 ENCOUNTER — Encounter: Payer: Self-pay | Admitting: Family Medicine

## 2015-06-04 ENCOUNTER — Ambulatory Visit
Admission: RE | Admit: 2015-06-04 | Discharge: 2015-06-04 | Disposition: A | Discharge status: Home / Self Care | Payer: BLUE CROSS/BLUE SHIELD | Source: Ambulatory Visit | Attending: Family Medicine | Admitting: Family Medicine

## 2015-06-04 ENCOUNTER — Ambulatory Visit (INDEPENDENT_AMBULATORY_CARE_PROVIDER_SITE_OTHER): Payer: BLUE CROSS/BLUE SHIELD | Admitting: Family Medicine

## 2015-06-04 VITALS — BP 114/82 | HR 99 | Temp 98.7°F | Ht 72.0 in | Wt 190.2 lb

## 2015-06-04 DIAGNOSIS — M542 Cervicalgia: Secondary | ICD-10-CM

## 2015-06-04 DIAGNOSIS — M5020 Other cervical disc displacement, unspecified cervical region: Secondary | ICD-10-CM

## 2015-06-04 DIAGNOSIS — J4530 Mild persistent asthma, uncomplicated: Secondary | ICD-10-CM

## 2015-06-04 DIAGNOSIS — M5010 Cervical disc disorder with radiculopathy, unspecified cervical region: Secondary | ICD-10-CM

## 2015-06-04 MED ORDER — METHOCARBAMOL 500 MG PO TABS: 500.0000 mg | ORAL_TABLET | Freq: Four times a day (QID) | ORAL | Status: DC

## 2015-06-04 MED ORDER — BECLOMETHASONE DIPROPIONATE 80 MCG/ACT IN AERS: 4.0000 | INHALATION_SPRAY | Freq: Two times a day (BID) | RESPIRATORY_TRACT | Status: DC

## 2015-06-04 NOTE — Progress Notes (Signed)
Dr. Karleen Hampshire T. Navaya Wiatrek, MD, CAQ Sports Medicine Primary Care and Sports Medicine [address] [place], [address] Phone: [phone] Fax: [phone]  06/04/2015  Patient: Antonio Lara, MRN: [id number], DOB: 09-16-1975, 39 y.o.  Primary Physician:  Hannah Beat, MD  Chief Complaint: Back Pain; Neck Pain; and Cough  Subjective:   Antonio Lara is a 39 y.o. very pleasant male patient who presents with the following:  Asthma not controlled. He is doing Qvar at 2 puffs twice a day.  He has been on this now for greater than 10 days, and he still is having quite a bit of difficulty with shortness of breath and walking.  I also recently had to put him on some oral steroids, which helped while he was on them.  Neck pain a little better. No more weakness.   Wt Readings from Last 3 Encounters:  06/04/15 190 lb 4 oz (86.297 kg)  05/21/15 191 lb 8 oz (86.864 kg)  12/04/14 191 lb (86.637 kg)      05/21/2015 Last OV with Hannah Beat, MD  Several issues:  Past couple of months, when weather changing summer to fall, started to have some asthma flair-ups.  Using some Qvar - some short of breath. Off and on - winded walking to the light switch.   Not Qvar now. Not taking.   B arm pain and radiating pain down his arms. Decreased ROM of the neck. Neck tight historically.  Neck has gotten worse and worse.  Going to the chiropractor now. Doing traction and manipulation which has helped some.  ? Bulging disc.   Biceps weak on L  Past Medical History, Surgical History, Social History, Family History, Problem List, Medications, and Allergies have been reviewed and updated if relevant.  Patient Active Problem List   Diagnosis Date Noted  . Mild persistent asthma in adult without complication 06/14/2013  . GERD 07/10/2009  . BACK PAIN, LUMBAR 06/10/2008  . Allergic rhinitis 04/08/2008    Past Medical History  Diagnosis Date  . GERD (gastroesophageal reflux  disease)   . Mononucleosis     in high school  . Asthma, exercise induced          ?  . Back pain, chronic     Dr. Annell Greening    No past surgical history on file.  Social History   Social History  . Marital Status: Single    Spouse Name: N/A  . Number of Children: 2  . 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 Smoking status: Never Smoker   . Smokeless tobacco: Never Used  . Alcohol Use: Yes     Comment: rarely  . Drug Use: No  . Sexual Activity: Not on file   Other Topics Concern  . Not on file   Social History Narrative   Drinks one caffeinated beverage a day.    No family history on file.  Allergies  Allergen Reactions  . Oxycodone-Acetaminophen     REACTION: extemely dizzy with nausea and vomiting.    Medication list reviewed and updated in full in [facility].  GEN: no acute illness or fever CV: No chest pain  MSK: detailed above Neuro: neurological signs are described above ROS O/w per HPI  Objective:   BP 114/82 mmHg  Pulse 99  Temp(Src) 98.7 F (37.1 C) (Oral)  Ht 6' (1.829 m)  Wt 190 lb  4 oz (86.297 kg)  BMI 25.80 kg/m2  SpO2 97%   GEN: Well-developed,well-nourished,in no acute distress; alert,appropriate and cooperative throughout examination HEENT: Normocephalic and atraumatic without obvious abnormalities. Ears, externally no deformities CV: RRR, no m/g/r  PULM: Normal respiratory rate, no accessory muscle use. No wheezes, crackles or rhonchi  EXT: No clubbing, cyanosis, or edema PSYCH: Normally interactive. Cooperative during the interview. Pleasant. Friendly and conversant. Not anxious or depressed appearing. Normal, full affect.  CERVICAL SPINE EXAM Range of motion: Flexion, extension, lateral bending, and rotation: 15% loss in each direction Pain with terminal motion: yes, improved Spinous Processes: NT SCM: NT Upper paracervical muscles: TTP,  improved Upper traps: TTP, more on the R, improved C5-T1 intact, sensation.   From a motor standpoint, now all 5/5  Radiology: No results found.  Assessment and Plan:   Mild persistent asthma in adult without complication  Cervicalgia - Plan: CANCELED: DG Cervical Spine Complete  Brachial neuritis or radiculitis due to rupture of cervical intervertebral disc - Plan: CANCELED: DG Cervical Spine Complete  Asthma, not well controlled.  Increase Qvar to maximum dose.  Refill Robaxin for his back.  Neurological symptoms have diminished with the exception of occasional radicular pain.  Strength and sensation is normal.  Follow-up: Return in about 1 month (around 07/05/2015).  New Prescriptions   METHOCARBAMOL (ROBAXIN) 500 MG TABLET    Take 1 tablet (500 mg total) by mouth 4 (four) times daily.   Modified Medications   Modified Medication Previous Medication   BECLOMETHASONE (QVAR) 80 MCG/ACT INHALER beclomethasone (QVAR) 80 MCG/ACT inhaler      Inhale 4 puffs into the lungs 2 (two) times daily. (Dispense 1 month supply for this dosing) FOR HIS NEXT REFILL    Inhale 1 puff into the lungs 2 (two) times daily.   No orders of the defined types were placed in this encounter.    Signed,  Elpidio GaleaSpencer T. Tavarion Babington, MD   Patient's Medications  New Prescriptions   METHOCARBAMOL (ROBAXIN) 500 MG TABLET    Take 1 tablet (500 mg total) by mouth 4 (four) times daily.  Previous Medications   ALBUTEROL (PROVENTIL HFA;VENTOLIN HFA) 108 (90 BASE) MCG/ACT INHALER    Inhale 2 puffs into the lungs every 6 (six) hours as needed (cough).   ESOMEPRAZOLE (NEXIUM) 20 MG CAPSULE    TAKE ONE CAPSULE BY MOUTH DAILY   LORATADINE (CLARITIN) 10 MG TABLET    Take 10 mg by mouth daily.   MULTIPLE VITAMIN (MULTIVITAMIN) TABLET    Take 1 tablet by mouth daily.  Modified Medications   Modified Medication Previous Medication   BECLOMETHASONE (QVAR) 80 MCG/ACT INHALER beclomethasone (QVAR) 80 MCG/ACT inhaler       Inhale 4 puffs into the lungs 2 (two) times daily. (Dispense 1 month supply for this dosing) FOR HIS NEXT REFILL    Inhale 1 puff into the lungs 2 (two) times daily.  Discontinued Medications   PREDNISONE (DELTASONE) 20 MG TABLET    2 tabs po for 5 days, then 1 po for 5 days

## 2015-06-04 NOTE — Progress Notes (Signed)
Pre visit review using our clinic review tool, if applicable. No additional management support is needed unless otherwise documented below in the visit note. 

## 2015-06-17 ENCOUNTER — Ambulatory Visit (INDEPENDENT_AMBULATORY_CARE_PROVIDER_SITE_OTHER): Payer: BLUE CROSS/BLUE SHIELD | Admitting: Family Medicine

## 2015-06-17 ENCOUNTER — Encounter: Payer: Self-pay | Admitting: Family Medicine

## 2015-06-17 VITALS — BP 120/82 | HR 80 | Temp 98.7°F | Ht 72.0 in | Wt 191.2 lb

## 2015-06-17 DIAGNOSIS — J45901 Unspecified asthma with (acute) exacerbation: Secondary | ICD-10-CM | POA: Diagnosis not present

## 2015-06-17 MED ORDER — PREDNISONE 20 MG PO TABS: ORAL_TABLET | ORAL | Status: DC

## 2015-06-17 MED ORDER — BUDESONIDE-FORMOTEROL FUMARATE 160-4.5 MCG/ACT IN AERO: 1.0000 | INHALATION_SPRAY | Freq: Two times a day (BID) | RESPIRATORY_TRACT | Status: DC

## 2015-06-17 NOTE — Progress Notes (Signed)
Pre visit review using our clinic review tool, if applicable. No additional management support is needed unless otherwise documented below in the visit note. 

## 2015-06-17 NOTE — Patient Instructions (Signed)
I want you to send me a MyChart message in 1 week to tell me if you are better, worse, or the same.

## 2015-06-17 NOTE — Progress Notes (Signed)
Dr. Karleen Hampshire T. Mena Lienau, MD, CAQ Sports Medicine Primary Care and Sports Medicine [address] [place], [address] Phone: [phone] Fax: [phone]  06/17/2015  Patient: Antonio Lara, MRN: [id number], DOB: July 30, 1976, 39 y.o.  Primary Physician:  Hannah Beat, MD  Chief Complaint: Asthma  Subjective:   Antonio Lara is a 39 y.o. very pleasant male patient who presents with the following:  470 on peak flow 650 expected  Still sob - we recently increased his inhaled corticosteroid dosing to the maximum amount for Qvar.  He still is having quite ability difficulty taking a deep breath and walking also produces some shortness of breath.  He is currently not able exercise.  Otherwise is a generally healthy 39 year old.  We also had to put him on oral steroids recently for some neck problems, which have on a side note improved somewhat.  06/04/2015 Last OV with Hannah Beat, MD  Asthma not controlled. He is doing Qvar at 2 puffs twice a day.  He has been on this now for greater than 10 days, and he still is having quite a bit of difficulty with shortness of breath and walking.  I also recently had to put him on some oral steroids, which helped while he was on them.  Neck pain a little better. No more weakness.   Wt Readings from Last 3 Encounters:  06/17/15 191 lb 4 oz (86.75 kg)  06/04/15 190 lb 4 oz (86.297 kg)  05/21/15 191 lb 8 oz (86.864 kg)      05/21/2015 Last OV with Hannah Beat, MD  Several issues:  Past couple of months, when weather changing summer to fall, started to have some asthma flair-ups.  Using some Qvar - some short of breath. Off and on - winded walking to the light switch.   Not Qvar now. Not taking.   B arm pain and radiating pain down his arms. Decreased ROM of the neck. Neck tight historically.  Neck has gotten worse and worse.  Going to the chiropractor now. Doing traction and manipulation which has helped some.  ?  Bulging disc.   Biceps weak on L  Past Medical History, Surgical History, Social History, Family History, Problem List, Medications, and Allergies have been reviewed and updated if relevant.  Patient Active Problem List   Diagnosis Date Noted  . Mild persistent asthma in adult without complication 06/14/2013  . GERD 07/10/2009  . BACK PAIN, LUMBAR 06/10/2008  . Allergic rhinitis 04/08/2008    Past Medical History  Diagnosis Date  . GERD (gastroesophageal reflux disease)   . Mononucleosis     in high school  . Asthma, exercise induced          ?  . Back pain, chronic     Dr. Annell Greening    No past surgical history on file.  Social History   Social History  . Marital Status: Single    Spouse Name: N/A  . Number of Children: 2  . 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 Smoking status: Never Smoker   . Smokeless tobacco: Never Used  . Alcohol Use: Yes     Comment: rarely  . Drug Use: No  . Sexual Activity: Not on file   Other Topics Concern  . Not on file   Social History Narrative   Drinks one caffeinated beverage a day.    No family history  on file.  Allergies  Allergen Reactions  . Oxycodone-Acetaminophen     REACTION: extemely dizzy with nausea and vomiting.    [facility].  GEN: no acute illness or fever CV: No chest pain  MSK: detailed above Neuro: neurological signs are described above ROS O/w per HPI  Objective:   BP 120/82 mmHg  Pulse 80  Temp(Src) 98.7 F (37.1 C) (Oral)  Ht 6' (1.829 m)  Wt 191 lb 4 oz (86.75 kg)  BMI 25.93 kg/m2  SpO2 98%  PF 470 L/min   GEN: Well-developed,well-nourished,in no acute distress; alert,appropriate and cooperative throughout examination HEENT: Normocephalic and atraumatic without obvious abnormalities. Ears, externally no deformities CV: RRR, no m/g/r  PULM: Normal respiratory  rate, no accessory muscle use. rare wheezes, no crackles or rhonchi  EXT: No clubbing, cyanosis, or edema PSYCH: Normally interactive. Cooperative during the interview. Pleasant. Friendly and conversant. Not anxious or depressed appearing. Normal, full affect.  Radiology: No results found.  Assessment and Plan:   Asthma attack  Unstable.  Pred x 5 days Change to symbicort, if not improving in 1 week, increase to max dose symbicort.  Follow-up: by phone in 1 week  Patient Instructions  I want you to send me a MyChart message in 1 week to tell me if you are better, worse, or the same.      New Prescriptions   BUDESONIDE-FORMOTEROL (SYMBICORT) 160-4.5 MCG/ACT INHALER    Inhale 1 puff into the lungs 2 (two) times daily.   PREDNISONE (DELTASONE) 20 MG TABLET    2 tabs po daily for 5 days   Signed,  Eriel Dunckel T. Jaeden Messer, MD   Patient's Medications  New Prescriptions   BUDESONIDE-FORMOTEROL (SYMBICORT) 160-4.5 MCG/ACT INHALER    Inhale 1 puff into the lungs 2 (two) times daily.   PREDNISONE (DELTASONE) 20 MG TABLET    2 tabs po daily for 5 days  Previous Medications   ALBUTEROL (PROVENTIL HFA;VENTOLIN HFA) 108 (90 BASE) MCG/ACT INHALER    Inhale 2 puffs into the lungs every 6 (six) hours as needed (cough).   ESOMEPRAZOLE (NEXIUM) 20 MG CAPSULE    TAKE ONE CAPSULE BY MOUTH DAILY   LORATADINE (CLARITIN) 10 MG TABLET    Take 10 mg by mouth daily.   METHOCARBAMOL (ROBAXIN) 500 MG TABLET    Take 1 tablet (500 mg total) by mouth 4 (four) times daily.   MULTIPLE VITAMIN (MULTIVITAMIN) TABLET    Take 1 tablet by mouth daily.  Modified Medications   No medications on file  Discontinued Medications   BECLOMETHASONE (QVAR) 80 MCG/ACT INHALER    Inhale 4 puffs into the lungs 2 (two) times daily. (Dispense 1 month supply for this dosing) FOR HIS NEXT REFILL

## 2015-06-22 ENCOUNTER — Encounter: Payer: Self-pay | Admitting: Family Medicine

## 2015-06-25 NOTE — Telephone Encounter (Signed)
To me this sounds fairly alarming, and I would like him to be evaluated tomorrow.  If he truly is convulsing, he needs to decrease the symbicort to 1 puff BID.  With this many episodes, I want him to see pulmonology, but he needs to be rechecked ASAP.

## 2015-06-26 ENCOUNTER — Ambulatory Visit (INDEPENDENT_AMBULATORY_CARE_PROVIDER_SITE_OTHER): Payer: BLUE CROSS/BLUE SHIELD | Admitting: Family Medicine

## 2015-06-26 ENCOUNTER — Encounter: Payer: Self-pay | Admitting: Family Medicine

## 2015-06-26 VITALS — BP 124/90 | HR 80 | Temp 98.5°F | Ht 72.0 in | Wt 191.0 lb

## 2015-06-26 DIAGNOSIS — M62838 Other muscle spasm: Secondary | ICD-10-CM | POA: Diagnosis not present

## 2015-06-26 DIAGNOSIS — J302 Other seasonal allergic rhinitis: Secondary | ICD-10-CM | POA: Diagnosis not present

## 2015-06-26 DIAGNOSIS — J4530 Mild persistent asthma, uncomplicated: Secondary | ICD-10-CM

## 2015-06-26 MED ORDER — MONTELUKAST SODIUM 10 MG PO TABS: 10.0000 mg | ORAL_TABLET | Freq: Every day | ORAL | Status: DC

## 2015-06-26 MED ORDER — METHOCARBAMOL 500 MG PO TABS: 500.0000 mg | ORAL_TABLET | Freq: Four times a day (QID) | ORAL | Status: DC

## 2015-06-26 NOTE — Assessment & Plan Note (Addendum)
Has noted significant improvement since restarting  claritin last night. If not conitnuing to improve.. Add singulair to regimen.

## 2015-06-26 NOTE — Patient Instructions (Addendum)
Continue claritin daily. Continue symbicort at current dose.  Add singulair daily if not continuing to improve  Follow up if not improiving as expected.

## 2015-06-26 NOTE — Progress Notes (Signed)
   Subjective:    Patient ID: Antonio Lara, male    DOB: [birth year], 39 y.o.   MRN: [id number]  HPI  39 year old  Male pt of Dr. Cyndie Chimeopland's with history of allergic rhinitis and mild persistent asthma maintained in past on Qvar presents for asthma exacerbation.Marland Kitchen.  He reports increase in asthma symptoms in last 1-2 months, worse in last few weeks. Increase in shortness of breath, wheeze, cough. Increased cough and shortness of breath with walking short distances. Sometime cough causes emesis. Increase in fatigue lately.  Some itchy eyes, sneeze, congestion.  No face pain, no ear pain, no fever.  Occ productive cough, some dry cough.  Restarted claritin yesterday. He has used albuterol few times a day.  Seen on 10/26 by Dr. Salena Saner: Changed to Symbicort at that time, increased to 4 a day SE , now back down to 1 puff twice daily.  At that OV started on pred 5 days, helped.  Muscle strain improving: needs refill of  Muscle relaxant  currently having spasming of abdominal wall muscles that is pulling him forward.  Peak flows today: best  520.Marland Kitchen. Last week best 472.  Social History /Family History/Past Medical History reviewed and updated if needed.  Review of Systems  Constitutional: Negative for fever and fatigue.  HENT: Negative for ear pain.   Eyes: Negative for pain.  Respiratory: Positive for chest tightness.   Cardiovascular: Negative for chest pain, palpitations and leg swelling.  Gastrointestinal: Negative for abdominal pain.       Objective:   Physical Exam  Constitutional: Vital signs are normal. He appears well-developed and well-nourished.  HENT:  Head: Normocephalic.  Right Ear: Hearing normal.  Left Ear: Hearing normal.  Nose: Mucosal edema and rhinorrhea present.  Mouth/Throat: Oropharynx is clear and moist and mucous membranes are normal.  Neck: Trachea normal. Carotid bruit is not present. No thyroid mass and no thyromegaly present.  Cardiovascular: Normal rate,  regular rhythm and normal pulses.  Exam reveals no gallop, no distant heart sounds and no friction rub.   No murmur heard. No peripheral edema  Pulmonary/Chest: Effort normal and breath sounds normal. No respiratory distress.  Lungs clear but during exam.. Pt had bronchospasm coughing fit.  Abdominal: Normal appearance and bowel sounds are normal. There is no hepatosplenomegaly. There is no tenderness. There is no CVA tenderness.  Skin: Skin is warm, dry and intact. No rash noted.  Psychiatric: He has a normal mood and affect. His speech is normal and behavior is normal. Thought content normal.  Somewhat histrionic          Assessment & Plan:

## 2015-06-26 NOTE — Assessment & Plan Note (Signed)
Stretching, refilled muscle relaxant.

## 2015-06-26 NOTE — Progress Notes (Signed)
Pre visit review using our clinic review tool, if applicable. No additional management support is needed unless otherwise documented below in the visit note. 

## 2015-06-26 NOTE — Assessment & Plan Note (Signed)
Improving with Singulair and s/p steroids.  Also likely improved with treatment of allergies.  No indication for further steroid course.

## 2015-06-29 ENCOUNTER — Other Ambulatory Visit: Payer: Self-pay | Admitting: Family Medicine

## 2015-07-09 ENCOUNTER — Ambulatory Visit (INDEPENDENT_AMBULATORY_CARE_PROVIDER_SITE_OTHER): Payer: BLUE CROSS/BLUE SHIELD | Admitting: Internal Medicine

## 2015-07-09 ENCOUNTER — Encounter: Payer: Self-pay | Admitting: Internal Medicine

## 2015-07-09 VITALS — BP 128/88 | HR 101 | Temp 99.1°F | Wt 195.0 lb

## 2015-07-09 DIAGNOSIS — J4540 Moderate persistent asthma, uncomplicated: Secondary | ICD-10-CM | POA: Diagnosis not present

## 2015-07-09 DIAGNOSIS — Z23 Encounter for immunization: Secondary | ICD-10-CM | POA: Diagnosis not present

## 2015-07-09 DIAGNOSIS — J309 Allergic rhinitis, unspecified: Secondary | ICD-10-CM | POA: Diagnosis not present

## 2015-07-09 NOTE — Addendum Note (Signed)
Addended by: Roena MaladyEVONTENNO, MELANIE Y on: 07/09/2015 02:56 PM   Modules accepted: Orders

## 2015-07-09 NOTE — Patient Instructions (Signed)
Allergic Rhinitis Allergic rhinitis is when the mucous membranes in the nose respond to allergens. Allergens are particles in the air that cause your body to have an allergic reaction. This causes you to release allergic antibodies. Through a chain of events, these eventually cause you to release histamine into the blood stream. Although meant to protect the body, it is this release of histamine that causes your discomfort, such as frequent sneezing, congestion, and an itchy, runny nose.  CAUSES Seasonal allergic rhinitis (hay fever) is caused by pollen allergens that may come from grasses, trees, and weeds. Year-round allergic rhinitis (perennial allergic rhinitis) is caused by allergens such as house dust mites, pet dander, and mold spores. SYMPTOMS  Nasal stuffiness (congestion).  Itchy, runny nose with sneezing and tearing of the eyes. DIAGNOSIS Your health care provider can help you determine the allergen or allergens that trigger your symptoms. If you and your health care provider are unable to determine the allergen, skin or blood testing may be used. Your health care provider will diagnose your condition after taking your health history and performing a physical exam. Your health care provider may assess you for other related conditions, such as asthma, pink eye, or an ear infection. TREATMENT Allergic rhinitis does not have a cure, but it can be controlled by:  Medicines that block allergy symptoms. These may include allergy shots, nasal sprays, and oral antihistamines.  Avoiding the allergen. Hay fever may often be treated with antihistamines in pill or nasal spray forms. Antihistamines block the effects of histamine. There are over-the-counter medicines that may help with nasal congestion and swelling around the eyes. Check with your health care provider before taking or giving this medicine. If avoiding the allergen or the medicine prescribed do not work, there are many new medicines  your health care provider can prescribe. Stronger medicine may be used if initial measures are ineffective. Desensitizing injections can be used if medicine and avoidance does not work. Desensitization is when a patient is given ongoing shots until the body becomes less sensitive to the allergen. Make sure you follow up with your health care provider if problems continue. HOME CARE INSTRUCTIONS It is not possible to completely avoid allergens, but you can reduce your symptoms by taking steps to limit your exposure to them. It helps to know exactly what you are allergic to so that you can avoid your specific triggers. SEEK MEDICAL CARE IF:  You have a fever.  You develop a cough that does not stop easily (persistent).  You have shortness of breath.  You start wheezing.  Symptoms interfere with normal daily activities.   This information is not intended to replace advice given to you by your health care provider. Make sure you discuss any questions you have with your health care provider.   Document Released: 05/03/2001 Document Revised: 08/29/2014 Document Reviewed: 04/15/2013 Elsevier Interactive Patient Education 2016 [employer] Inc.  

## 2015-07-09 NOTE — Progress Notes (Signed)
HPI  Pt presents to the clinic today with c/o cough and chest congestion. He reports this started 2 days ago. He has had some nasal congestion, facial pressure and sore throat as well. He is not blowing anything out of his nose. The cough is worse first thing in the morning. The cough is productive of yellow/brown mucous. He denies fever, chill and body aches. He does feel like he has been wheezing and has been SOB. He was seen 3 x in the last month for uncontrolled asthma. QVAR was changed to Symbicort. He was on Prednisone for a while. Singulair was also added. He is also taking Claritin OTC.  Review of Systems      Past Medical History  Diagnosis Date  . GERD (gastroesophageal reflux disease)   . Mononucleosis     in high school  . Asthma, exercise induced          ?  . Back pain, chronic     Dr. Annell Greening    No family history on file.  Social History   Social History  . Marital Status: Single    Spouse Name: N/A  . Number of Children: 2  . 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 Smoking status: Never Smoker   . Smokeless tobacco: Never Used  . Alcohol Use: Yes     Comment: rarely  . Drug Use: No  . Sexual Activity: Not on file   Other Topics Concern  . Not on file   Social History Narrative   Drinks one caffeinated beverage a day.    Allergies  Allergen Reactions  . Oxycodone-Acetaminophen     REACTION: extemely dizzy with nausea and vomiting.     Constitutional:  Denies headache, fatigue, fever or abrupt weight changes.  HEENT:  Positive nasal congestion, sore throat. Denies eye redness, eye pain, pressure behind the eyes, facial pain, ear pain, ringing in the ears, wax buildup, runny nose or bloody nose. Respiratory: Positive cough. Denies difficulty breathing or shortness of breath.  Cardiovascular: Denies chest pain, chest tightness, palpitations or swelling in the hands or feet.   No  other specific complaints in a complete review of systems (except as listed in HPI above).  Objective:   BP 128/88 mmHg  Pulse 101  Temp(Src) 99.1 F (37.3 C) (Oral)  Wt 195 lb (88.451 kg)  SpO2 99%  Wt Readings from Last 3 Encounters:  06/26/15 191 lb (86.637 kg)  06/17/15 191 lb 4 oz (86.75 kg)  06/04/15 190 lb 4 oz (86.297 kg)     General: Appears his stated age,  in NAD. HEENT: Head: normal shape and size, no sinus tenderness noted; Eyes: sclera white, no icterus, conjunctiva pink; Ears: Tm's gray and intact, normal light reflex; Nose: mucosa pink and moist, septum midline; Throat/Mouth: + PND. Teeth present, mucosa pink and moist, no exudate noted, no lesions or ulcerations noted.  Neck: No ervical lymphadenopathy.  Cardiovascular: Normal rate and rhythm. S1,S2 noted.  No murmur, rubs or gallops noted.  Pulmonary/Chest: Normal effort and positive vesicular breath sounds. No respiratory distress. No wheezes, rales or ronchi noted.      Assessment & Plan:   Allergic Rhinitis:  Get some rest and drink plenty of water No evidence of wheezing or lung infection Advised him to stop Claritin and start Allegra or Zyrtec Add in Flonase/Nasocort Continue Singulair, Symbicort and Albuterol prn Flu and Pneumovax today  Return precautions given  RTC as needed or if symptoms persist.

## 2015-07-09 NOTE — Progress Notes (Signed)
Pre visit review using our clinic review tool, if applicable. No additional management support is needed unless otherwise documented below in the visit note. 

## 2015-07-15 ENCOUNTER — Ambulatory Visit (INDEPENDENT_AMBULATORY_CARE_PROVIDER_SITE_OTHER): Payer: BLUE CROSS/BLUE SHIELD | Admitting: Family Medicine

## 2015-07-15 ENCOUNTER — Encounter: Payer: Self-pay | Admitting: Family Medicine

## 2015-07-15 VITALS — BP 120/84 | HR 87 | Temp 98.4°F | Ht 72.0 in | Wt 192.8 lb

## 2015-07-15 DIAGNOSIS — J4540 Moderate persistent asthma, uncomplicated: Secondary | ICD-10-CM | POA: Diagnosis not present

## 2015-07-15 DIAGNOSIS — J208 Acute bronchitis due to other specified organisms: Secondary | ICD-10-CM | POA: Diagnosis not present

## 2015-07-15 MED ORDER — AZITHROMYCIN 250 MG PO TABS: ORAL_TABLET | ORAL | Status: DC

## 2015-07-15 NOTE — Progress Notes (Signed)
Pre visit review using our clinic review tool, if applicable. No additional management support is needed unless otherwise documented below in the visit note. 

## 2015-07-15 NOTE — Patient Instructions (Signed)

## 2015-07-15 NOTE — Progress Notes (Signed)
Dr. Karleen HampshireSpencer T. Shalandra Leu, MD, CAQ [facility] [address] [place], [id number] Phone: [phone] Fax: [phone]  07/15/2015  Patient: Antonio DecemberWhitaker L Men, MRN: [id number], DOB: [birth year], 39 y.o.  Primary Physician:  Hannah BeatSpencer Layal Javid, MD   Chief Complaint  Patient presents with  . Cough  . Shortness of Breath  . Wheezing   Subjective:   Antonio Lara is a 39 y.o. very pleasant male patient who presents with the following:  Asthma, deteriorated. This patient has had 7 office visits in the last year for asthma, and he has continued to do poorly.  The last time we checked his peak flow meter which was less than 1 month ago it was 470 with an expected value of 650.  I recently changed his asthma medication to Symbicort b.i.d., and he also is taking Singulair.  He continues to have symptoms and expresses that he is short of breath with minimal exertion.  Acutely, he has become sick, and he has thick productive sputum that is anywhere from whitish to green and brown.  He is not a smoker.  Fever, and illness, too.  Chunky stuff, clear and white, was brown and green.   Past Medical History, Surgical History, Social History, Family History, Problem List, Medications, and Allergies have been reviewed and updated if relevant.  Patient Active Problem List   Diagnosis Date Noted  . Spasm of abdominal muscles of left side 06/26/2015  . Mild persistent asthma in adult without complication 06/14/2013  . GERD 07/10/2009  . BACK PAIN, LUMBAR 06/10/2008  . Allergic rhinitis 04/08/2008    Past Medical History  Diagnosis Date  . GERD (gastroesophageal reflux disease)   . Mononucleosis     in high school  . Asthma, exercise induced          ?  . Back pain, chronic     Dr. Annell GreeningMark Yates    No past surgical history on file.  Social History   Social History  . Marital Status: Single    Spouse Name: N/A  . Number of Children: 2  .  Years of Education: N/A   Occupational History  . Sales Time [employer]   Social History Main Topics  . Smoking status: Never Smoker   . Smokeless tobacco: Never Used  . Alcohol Use: Yes     Comment: rarely  . Drug Use: No  . Sexual Activity: Not on file   Other Topics Concern  . Not on file   Social History Narrative   Drinks one caffeinated beverage a day.    No family history on file.  Allergies  Allergen Reactions  . Oxycodone-Acetaminophen     REACTION: extemely dizzy with nausea and vomiting.    Medication list reviewed and [facility] in full [facility].  ROS: GEN: Acute illness details above GI: Tolerating PO intake GU: maintaining adequate hydration and urination Pulm: SOB and above Interactive and getting along well at home.  Otherwise, ROS is as per the HPI.   Objective:   BP 120/84 mmHg  Pulse 87  Temp(Src) 98.4 F (36.9 C) (Oral)  Ht 6' (1.829 m)  Wt 192 lb 12 oz (87.431 kg)  BMI 26.14 kg/m2  SpO2 98%  GEN: WDWN, NAD, Non-toxic, A & O x 3 HEENT: Atraumatic, Normocephalic. Neck supple. No masses, No LAD. Ears and Nose: No external deformity. CV: RRR, No M/G/R. No JVD. No thrill. No extra heart sounds. PULM: CTA  B, no wheezes, crackles, rhonchi. No retractions. No resp. distress. No accessory muscle use. EXTR: No c/c/e NEURO Normal gait.  PSYCH: Normally interactive. Conversant. Not depressed or anxious appearing.  Calm demeanor.   Laboratory and Imaging Data:  Assessment and Plan:   Asthma, moderate persistent, poorly-controlled - Plan: Ambulatory referral to Pulmonology  Acute bronchitis due to other specified organisms  Acute illness, but I really more concerned about his persistent asthma and shortness of breath.  He has continued to do poorly despite significant increases in his medication. I will increase his Symbicort to 2 puffs b.i.d., and I think it would be helpful in this case to get pulmonology's opinion.  Any guidance  is appreciated.  New Prescriptions   AZITHROMYCIN (ZITHROMAX) 250 MG TABLET    2 tabs po on day 1, then 1 tab po for 4 days   Orders Placed This Encounter  Procedures  . Ambulatory referral to Pulmonology    Signed,  Karleen Hampshire T. Katielynn Horan, MD   Patient's Medications  New Prescriptions   AZITHROMYCIN (ZITHROMAX) 250 MG TABLET    2 tabs po on day 1, then 1 tab po for 4 days  Previous Medications   ALBUTEROL (PROVENTIL HFA;VENTOLIN HFA) 108 (90 BASE) MCG/ACT INHALER    Inhale 2 puffs into the lungs every 6 (six) hours as needed (cough).   BUDESONIDE-FORMOTEROL (SYMBICORT) 160-4.5 MCG/ACT INHALER    Inhale 1 puff into the lungs 2 (two) times daily.   CETIRIZINE (ZYRTEC) 10 MG TABLET    Take 10 mg by mouth daily.   ESOMEPRAZOLE (NEXIUM) 20 MG CAPSULE    TAKE ONE CAPSULE BY MOUTH DAILY   METHOCARBAMOL (ROBAXIN) 500 MG TABLET    Take 1 tablet (500 mg total) by mouth 4 (four) times daily.   MONTELUKAST (SINGULAIR) 10 MG TABLET    Take 1 tablet (10 mg total) by mouth at bedtime.   MULTIPLE VITAMIN (MULTIVITAMIN) TABLET    Take 1 tablet by mouth daily.  Modified Medications   No medications on file  Discontinued Medications   LORATADINE (CLARITIN) 10 MG TABLET    Take 10 mg by mouth daily.

## 2015-07-17 MED ORDER — BUDESONIDE-FORMOTEROL FUMARATE 160-4.5 MCG/ACT IN AERO: 2.0000 | INHALATION_SPRAY | Freq: Two times a day (BID) | RESPIRATORY_TRACT | Status: DC

## 2015-07-30 ENCOUNTER — Ambulatory Visit (INDEPENDENT_AMBULATORY_CARE_PROVIDER_SITE_OTHER): Payer: BLUE CROSS/BLUE SHIELD | Admitting: Pulmonary Disease

## 2015-07-30 ENCOUNTER — Encounter: Payer: Self-pay | Admitting: Pulmonary Disease

## 2015-07-30 VITALS — BP 116/74 | HR 89 | Ht 72.0 in | Wt 191.0 lb

## 2015-07-30 DIAGNOSIS — K219 Gastro-esophageal reflux disease without esophagitis: Secondary | ICD-10-CM

## 2015-07-30 DIAGNOSIS — R059 Cough, unspecified: Secondary | ICD-10-CM

## 2015-07-30 DIAGNOSIS — J4541 Moderate persistent asthma with (acute) exacerbation: Secondary | ICD-10-CM

## 2015-07-30 DIAGNOSIS — J309 Allergic rhinitis, unspecified: Secondary | ICD-10-CM | POA: Diagnosis not present

## 2015-07-30 DIAGNOSIS — R05 Cough: Secondary | ICD-10-CM

## 2015-07-30 MED ORDER — PREDNISONE 20 MG PO TABS: 20.0000 mg | ORAL_TABLET | Freq: Every day | ORAL | Status: DC

## 2015-07-30 MED ORDER — DOXYCYCLINE HYCLATE 100 MG PO TABS: 100.0000 mg | ORAL_TABLET | Freq: Two times a day (BID) | ORAL | Status: DC

## 2015-08-01 NOTE — Progress Notes (Signed)
PULMONARY OFFICE CONSULT NOTE  DATE: 07/30/15 REFERRING: Alonza Smoker, MD   PT PROFILE: 33 M with asthma, intractable chronic cough  HPI: 27 M h/o asthma diagnosed as exercise induced asthma in college. Also carries diagnoses of GERD and perennial allergies. He is referred for evaluation of uncontrolled symptoms. His chief complaint is intractable cough. He describes 6 months of severe paroxysmal cough. These episodes are often so severe that they cause near syncope and are interfering with his ability to perform his work. The cough is minimally productive of thick white, occasionally greenish mucus. It occurs daily. He is uncertain if he coughs at night but he does awaken @ night, perhaps due to cough. He has no fever, CP, hemoptysis, LE edema or calf tenderness. He did have some increase in exertional dyspnea but believes that is improving now. Over the course of these past several months, his inhaled steroid (Qvar) was changed to Symbicort. He has been on Singulair X 1 month. Neither of these interventions helped much. He underwent one short course of prednisone 1-2 months ago which helped briefly but symptoms relapsed after approx 5 days after completion. He has symptoms of GERD which are well controlled with chronic PPI therapy.  Past Medical History  Diagnosis Date  . GERD (gastroesophageal reflux disease)   . Mononucleosis     in high school  . Asthma, exercise induced          ?  . Back pain, chronic     Dr. Annell Greening   History reviewed. No pertinent past surgical history. Current Outpatient Prescriptions on File Prior to Visit  Medication Sig Dispense Refill  . albuterol (PROVENTIL HFA;VENTOLIN HFA) 108 (90 BASE) MCG/ACT inhaler Inhale 2 puffs into the lungs every 6 (six) hours as needed (cough). 1 Inhaler 5  . budesonide-formoterol (SYMBICORT) 160-4.5 MCG/ACT inhaler Inhale 2 puffs into the lungs 2 (two) times daily. 1 Inhaler 5  . cetirizine (ZYRTEC) 10 MG tablet Take 10 mg  by mouth daily.    Marland Kitchen esomeprazole (NEXIUM) 20 MG capsule TAKE ONE CAPSULE BY MOUTH DAILY 30 capsule 11  . methocarbamol (ROBAXIN) 500 MG tablet Take 1 tablet (500 mg total) by mouth 4 (four) times daily. 40 tablet 0  . montelukast (SINGULAIR) 10 MG tablet Take 1 tablet (10 mg total) by mouth at bedtime. 30 tablet 3  . Multiple Vitamin (MULTIVITAMIN) tablet Take 1 tablet by mouth daily.     No current facility-administered medications on file prior to visit.   Family History  Problem Relation Age of Onset  . Breast cancer Mother   . Stroke Father   . Cancer Maternal Grandfather    Social History   Social History  . Marital Status: Single    Spouse Name: N/A  . Number of Children: 2  . 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 Smoking status: Never Smoker   . Smokeless tobacco: Never Used  . Alcohol Use: Yes     Comment: rarely  . Drug Use: No  . Sexual Activity: Not on file   Other Topics Concern  . Not on file   Social History Narrative   Drinks one caffeinated beverage a day.   Review of Systems - Other than as noted in HPI, a detailed ROS is negative. There is no N/V/D, abd pain, dysuria  Filed Vitals:   07/30/15 1118  BP: 116/74  Pulse: 89  Height: 6' (  1.829 m)  Weight: 191 lb (86.637 kg)  SpO2: 99%    Gen: WDWN in NAD HEENT: All WNL Neck: NO LAN, no JVD noted Lungs: full BS, normal percussion note throughout, no adventitious sounds Cardiovascular: Reg rate, normal rhythm, no M noted Abdomen: Soft, NT +BS Ext: no C/C/E Neuro: CNs intact, motor/sens grossly intact Skin: No lesions noted    DATA: No recent blood work results available  No recent CXR  IMPRESSION: Asthma, moderate persistent GERD Allergic rhinitis Intractable cough  His medical regimen for all of the above is well thought out and maximized. He insists that he is compliant with his meds. Will treat as an acute  exacerbation of asthma although no wheezing is noted on exam  PLAN: Cont medical regimen as above - Symbicort, montelukast, PRN albuterol, Esomeprazole, cetirizine  Doxycycline 100 mg BID X 7 days Prednisone 60 mg daily X 3, 40 mg daily X 3, 20 mg daily X 3 ROV 2 wks with CXR  Billy Fischeravid Travone Georg, MD PCCM service Mobile [phone] Pager [phone]

## 2015-08-03 ENCOUNTER — Other Ambulatory Visit: Payer: Self-pay | Admitting: Pulmonary Disease

## 2015-08-03 DIAGNOSIS — J45909 Unspecified asthma, uncomplicated: Secondary | ICD-10-CM

## 2015-08-05 ENCOUNTER — Ambulatory Visit
Admission: RE | Admit: 2015-08-05 | Discharge: 2015-08-05 | Disposition: A | Discharge status: Home / Self Care | Payer: BLUE CROSS/BLUE SHIELD | Source: Ambulatory Visit | Attending: Pulmonary Disease | Admitting: Pulmonary Disease

## 2015-08-05 DIAGNOSIS — J45909 Unspecified asthma, uncomplicated: Secondary | ICD-10-CM | POA: Diagnosis not present

## 2015-08-10 ENCOUNTER — Ambulatory Visit (INDEPENDENT_AMBULATORY_CARE_PROVIDER_SITE_OTHER): Payer: BLUE CROSS/BLUE SHIELD | Admitting: Internal Medicine

## 2015-08-10 ENCOUNTER — Encounter: Payer: Self-pay | Admitting: Internal Medicine

## 2015-08-10 VITALS — BP 122/64 | HR 87 | Ht 72.0 in | Wt 194.8 lb

## 2015-08-10 DIAGNOSIS — J4530 Mild persistent asthma, uncomplicated: Secondary | ICD-10-CM

## 2015-08-10 NOTE — Patient Instructions (Signed)
Asthma Attack Prevention While you may not be able to control the fact that you have asthma, you can take actions to prevent asthma attacks. The best way to prevent asthma attacks is to maintain good control of your asthma. You can achieve this by:  Taking your medicines as directed.  Avoiding things that can irritate your airways or make your asthma symptoms worse (asthma triggers).  Keeping track of how well your asthma is controlled and of any changes in your symptoms.  Responding quickly to worsening asthma symptoms (asthma attack).  Seeking emergency care when it is needed. WHAT ARE SOME WAYS TO PREVENT AN ASTHMA ATTACK? Have a Plan Work with your health care provider to create a written plan for managing and treating your asthma attacks (asthma action plan). This plan includes:  A list of your asthma triggers and how you can avoid them.  Information on when medicines should be taken and when their dosages should be changed.  The use of a device that measures how well your lungs are working (peak flow meter). Monitor Your Asthma Use your peak flow meter and record your results in a journal every day. A drop in your peak flow numbers on one or more days may indicate the start of an asthma attack. This can happen even before you start to feel symptoms. You can prevent an asthma attack from getting worse by following the steps in your asthma action plan. Avoid Asthma Triggers Work with your asthma health care provider to find out what your asthma triggers are. This can be done by:  Allergy testing.  Keeping a journal that notes when asthma attacks occur and the factors that may have contributed to them.  Determining if there are other medical conditions that are making your asthma worse. Once you have determined your asthma triggers, take steps to avoid them. This may include avoiding excessive or prolonged exposure to:  Dust. Have someone dust and vacuum your home for you once or  twice a week. Using a high-efficiency particulate arrestance (HEPA) vacuum is best.  Smoke. This includes campfire smoke, forest fire smoke, and secondhand smoke from tobacco products.  Pet dander. Avoid contact with animals that you know you are allergic to.  Allergens from trees, grasses or pollens. Avoid spending a lot of time outdoors when pollen counts are high, and on very windy days.  Very cold, dry, or humid air.  Mold.  Foods that contain high amounts of sulfites.  Strong odors.  Outdoor air pollutants, such as engine exhaust.  Indoor air pollutants, such as aerosol sprays and fumes from household cleaners.  Household pests, including dust mites and cockroaches, and pest droppings.  Certain medicines, including NSAIDs. Always talk to your health care provider before stopping or starting any new medicines. Medicines Take over-the-counter and prescription medicines only as told by your health care provider. Many asthma attacks can be prevented by carefully following your medicine schedule. Taking your medicines correctly is especially important when you cannot avoid certain asthma triggers. Act Quickly If an asthma attack does happen, acting quickly can decrease how severe it is and how long it lasts. Take these steps:   Pay attention to your symptoms. If you are coughing, wheezing, or having difficulty breathing, do not wait to see if your symptoms go away on their own. Follow your asthma action plan.  If you have followed your asthma action plan and your symptoms are not improving, call your health care provider or seek immediate medical care   at the nearest hospital. It is important to note how often you need to use your fast-acting rescue inhaler. If you are using your rescue inhaler more often, it may mean that your asthma is not under control. Adjusting your asthma treatment plan may help you to prevent future asthma attacks and help you to gain better control of your  condition. HOW CAN I PREVENT AN ASTHMA ATTACK WHEN I EXERCISE? Follow advice from your health care provider about whether you should use your fast-acting inhaler before exercising. Many people with asthma experience exercise-induced bronchoconstriction (EIB). This condition often worsens during vigorous exercise in cold, humid, or dry environments. Usually, people with EIB can stay very active by pre-treating with a fast-acting inhaler before exercising.   This information is not intended to replace advice given to you by your health care provider. Make sure you discuss any questions you have with your health care provider.   Document Released: 07/27/2009 Document Revised: 04/29/2015 Document Reviewed: 01/08/2015 Elsevier Interactive Patient Education 2016 Elsevier Inc.  

## 2015-08-10 NOTE — Progress Notes (Signed)
PULMONARY OFFICE CONSULT NOTE  DATE: 07/30/15 REFERRING: Alonza SmokerStephen Copeland, MD   CC: follow up asthma exacerbation  HPI:  Patient with recent dx of ASTHMA exacerbation Feels much better today after taking steroids and abx Takes symbicort, albuterol as needed  Wheezing improved, cough and SOB improved No signs of infection at this time   Review of Systems  Constitutional: Negative for fever, chills, weight loss and malaise/fatigue.  HENT: Positive for congestion.   Respiratory: Positive for cough. Negative for hemoptysis, sputum production, shortness of breath and wheezing.   Cardiovascular: Negative for chest pain, palpitations and orthopnea.  All other systems reviewed and are negative.    Filed Vitals:   08/10/15 1105  BP: 122/64  Pulse: 87  Height: 6' (1.829 m)  Weight: 194 lb 12.8 oz (88.361 kg)  SpO2: 99%    Gen: WDWN in NAD HEENT: All WNL Neck: NO LAN, no JVD noted Lungs: full BS, normal percussion note throughout, no adventitious sounds Cardiovascular: Reg rate, normal rhythm, no M noted   CXR 08/05/15  Images reveiwed with patient 08/10/2015   IMPRESSION: 39 yo white male with recenet dx of ASTHMA exacerbation with baseline mild to moderate persistent ASTHMA   PLAN: -Continue Symbicort, montelukast, PRN albuterol, Esomeprazole, cetirizine  -avoid dust and second hand smoke -advised to wear mask if cleaning house -advised to place sheets and pilows/cases in to drier high heat to kill dust mites  Follow up with Dr. Sung AmabileSimonds in 3 months   I have personally obtained a history, examined the patient, evaluated Pertinent laboratory and RadioGraphic/imaging results, and  formulated the assessment and plan The Patient requires high complexity decision making for assessment and support, frequent evaluation and titration of therapies. Patient  satisfied with Plan of action and management. All questions answered  Lucie LeatherKurian David Denessa Cavan, M.D.  [facility]  Medical Director [facility] Medical Director [facility]

## 2015-08-17 ENCOUNTER — Other Ambulatory Visit: Payer: Self-pay | Admitting: Family Medicine

## 2015-08-18 MED ORDER — METHOCARBAMOL 500 MG PO TABS
500.0000 mg | ORAL_TABLET | Freq: Four times a day (QID) | ORAL | Status: DC
Start: 2015-08-18 — End: 2016-11-09

## 2015-08-18 NOTE — Addendum Note (Signed)
Addended by: Damita LackLORING, DONNA S on: 08/18/2015 09:25 AM   Modules accepted: Orders

## 2015-10-14 ENCOUNTER — Encounter: Payer: Self-pay | Admitting: Family Medicine

## 2015-10-14 ENCOUNTER — Ambulatory Visit (INDEPENDENT_AMBULATORY_CARE_PROVIDER_SITE_OTHER): Payer: BLUE CROSS/BLUE SHIELD | Admitting: Family Medicine

## 2015-10-14 VITALS — BP 102/74 | HR 78 | Temp 98.4°F | Ht 72.0 in | Wt 192.5 lb

## 2015-10-14 DIAGNOSIS — M542 Cervicalgia: Secondary | ICD-10-CM

## 2015-10-14 DIAGNOSIS — M5416 Radiculopathy, lumbar region: Secondary | ICD-10-CM

## 2015-10-14 DIAGNOSIS — M546 Pain in thoracic spine: Secondary | ICD-10-CM

## 2015-10-14 NOTE — Progress Notes (Signed)
Pre visit review using our clinic review tool, if applicable. No additional management support is needed unless otherwise documented below in the visit note. 

## 2015-10-14 NOTE — Progress Notes (Signed)
Dr. Karleen Hampshire T. Siarah Deleo, MD, CAQ Sports Medicine [facility] [address] [place], [address] Phone: [phone] Fax: [phone]  10/14/2015  Patient: Antonio Lara, MRN: [id number], DOB: August 09, 1976, 40 y.o.  Primary Physician:  Hannah Beat, MD   Chief Complaint  Patient presents with  . Back Pain    Low back radiating down right leg   Subjective:   Antonio Lara is a 40 y.o. very pleasant male patient who presents with the following: Back Pain  ongoing for approximately: multiple months The patient has had back pain before. The back pain is localized into the lumbar spine area. They also describe R radiculopathy.  Back is not doing ewll - ongoing for a while. Lower right region, about the size of his hand. Unpleasant and will burn. Massage at the hspital.   Went to the chiropractor - Dr. Larinda Buttery in [place].  Went from 9.9/10 to 5/10. Then all came back.   Cervical to thoracic and lumbar pain.  Working a lot.  Pain all the way down his R leg.  Walking like a hunched over old man.   Feels better with rest.  Ibuprofen prn.   Working 45-60 hours a week.   Motrin HEP  No numbness or tingling. No bowel or bladder incontinence. No focal weakness. Prior interventions: massage, chiropractor Physical therapy: No Chiropractic manipulations: Y Acupuncture: No Osteopathic manipulation: No Heat or cold: Minimal effect  Past Medical History, Surgical History, Family History, Medications, Allergies have been reviewed and updated if relevant.  Patient Active Problem List   Diagnosis Date Noted  . Mild persistent asthma in adult without complication 06/14/2013  . GERD 07/10/2009  . BACK PAIN, LUMBAR 06/10/2008  . Allergic rhinitis 04/08/2008    Past Medical History  Diagnosis Date  . GERD (gastroesophageal reflux disease)   . Mononucleosis     in high school  . Asthma, exercise induced          ?  . Back pain, chronic       Dr. Annell Greening    No past surgical history on file.  Social History   Social History  . Marital Status: Single    Spouse Name: N/A  . Number of Children: 2  . 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 Smoking status: Never Smoker   . Smokeless tobacco: Never Used  . Alcohol Use: Yes     Comment: rarely  . Drug Use: No  . Sexual Activity: Not on file   Other Topics Concern  . Not on file   Social History Narrative   Drinks one caffeinated beverage a day.    Family History  Problem Relation Age of Onset  . Breast cancer Mother   . Stroke Father   . Cancer Maternal Grandfather     Allergies  Allergen Reactions  . Oxycodone-Acetaminophen     REACTION: extemely dizzy with nausea and vomiting.    Medication list reviewed and updated in full in [facility].  GEN: No fevers, chills. Nontoxic. Primarily MSK c/o today. MSK: Detailed in the HPI GI: tolerating PO intake without difficulty Neuro: As above  Otherwise the pertinent positives of the ROS are noted above.    Objective:   Blood pressure 102/74, pulse 78, temperature 98.4 F (36.9 C), temperature source Oral, height 6' (1.829 m), weight 192 lb 8 oz (87.317 kg), SpO2 96 %.  Gen: Well-developed,well-nourished,in no acute distress; alert,appropriate and cooperative throughout examination HEENT: Normocephalic and atraumatic without obvious abnormalities.  Ears, externally no deformities Pulm: Breathing comfortably in no respiratory distress Range of motion at  the waist: Flexion, rotation and lateral bending: full  No echymosis or edema Rises to examination table with no difficulty Gait: minimally antalgic  Inspection/Deformity: No abnormality Paraspinus T:  Mildly tender l3-s1  B Ankle Dorsiflexion (L5,4): 5/5 B Great Toe Dorsiflexion (L5,4): 5/5 Heel Walk (L5): WNL Toe Walk (S1): WNL Rise/Squat (L4): WNL, mild  pain  SENSORY B Medial Foot (L4): WNL B Dorsum (L5): WNL B Lateral (S1): WNL Light Touch: WNL Pinprick: WNL  REFLEXES Knee (L4): 2+ Ankle (S1): 2+  B SLR, seated: neg B SLR, supine: neg B FABER: neg B Reverse FABER: neg B Greater Troch: NT B Log Roll: neg B Stork: NT B Sciatic Notch: NT  Radiology: CLINICAL DATA: Low back pain with bilateral leg pain and buttock pain  EXAM: MRI LUMBAR SPINE WITHOUT CONTRAST  TECHNIQUE: Multiplanar, multisequence MR imaging of the lumbar spine was performed. No intravenous contrast was administered.  COMPARISON: None.  FINDINGS: The vertebral bodies of the lumbar spine are normal in size. 3 mm of retrolisthesis of L5 on S1. There is normal bone marrow signal demonstrated throughout the vertebra. The intervertebral disc spaces are well-maintained. There is disc desiccation at L5-S1.  The spinal cord is normal in signal and contour. The cord terminates normally at L1 . The nerve roots of the cauda equina and the filum terminale are normal.  The visualized portions of the SI joints are unremarkable.  There is a 3 cm T2 hyperintense left inferior pole renal mass most consistent with a cyst.  T12-L1: No significant disc bulge. No evidence of neural foraminal stenosis. No central canal stenosis.  L1-L2: No significant disc bulge. No evidence of neural foraminal stenosis. No central canal stenosis.  L2-L3: No significant disc bulge. No evidence of neural foraminal stenosis. No central canal stenosis.  L3-L4: No significant disc bulge. No evidence of neural foraminal stenosis. No central canal stenosis.  L4-L5: No significant disc bulge. No evidence of neural foraminal stenosis. No central canal stenosis.  L5-S1: Small shallow right paracentral disc protrusion in close proximity to the right intraspinal S1 nerve root. No evidence of neural foraminal stenosis. No central canal stenosis. Mild bilateral facet  arthropathy.  IMPRESSION: 1. At L5-S1 there is a small shallow right paracentral disc protrusion in close proximity to the right intraspinal S1 nerve root. Mild bilateral facet arthropathy.   Electronically Signed  By: Elige Ko  On: 12/19/2014 16:08  Assessment and Plan:   Lumbar radiculopathy, acute  Cervicalgia  Thoracic back pain, unspecified back pain laterality   Anatomy reviewed. Conservative algorithms for acute back pain generally begin with the following: NSAIDS, Muscle Relaxants, Mild pain medication  Start with medications, core rehab, and progress from there following low back pain algorithm. No red flags are present.  I tried to get him to go to formal physical therapy, but he declined that recommendation.  I spent some time going over some rehabilitation protocols with him from [employer] [facility] of orthopedics surgeons as well as [facility].  Overall, his exam is fairly reassuring.  I think that he needs to get in better shape overall, and he is willing to start working out, doing some basic rehabilitation working on his core.  Follow-up: if needed  Signed,  Gevin Perea T. Lenia Housley, MD   Patient's Medications  New Prescriptions  No medications on file  Previous Medications   ALBUTEROL (PROVENTIL HFA;VENTOLIN HFA) 108 (90 BASE) MCG/ACT INHALER    Inhale 2 puffs into the lungs every 6 (six) hours as needed (cough).   BUDESONIDE-FORMOTEROL (SYMBICORT) 160-4.5 MCG/ACT INHALER    Inhale 2 puffs into the lungs 2 (two) times daily.   CETIRIZINE (ZYRTEC) 10 MG TABLET    Take 10 mg by mouth daily.   ESOMEPRAZOLE (NEXIUM) 20 MG CAPSULE    TAKE ONE CAPSULE BY MOUTH DAILY   METHOCARBAMOL (ROBAXIN) 500 MG TABLET    Take 1 tablet (500 mg total) by mouth 4 (four) times daily.   MONTELUKAST (SINGULAIR) 10 MG TABLET    Take 1 tablet (10 mg total) by mouth at bedtime.   MULTIPLE VITAMIN (MULTIVITAMIN) TABLET    Take 1 tablet by mouth daily.   TRIAMCINOLONE (NASACORT  ALLERGY 24HR) 55 MCG/ACT AERO NASAL INHALER    Place 2 sprays into the nose daily.  Modified Medications   No medications on file  Discontinued Medications   No medications on file

## 2015-10-22 ENCOUNTER — Other Ambulatory Visit: Payer: Self-pay | Admitting: Family Medicine

## 2015-11-17 ENCOUNTER — Encounter: Payer: Self-pay | Admitting: Pulmonary Disease

## 2015-11-17 ENCOUNTER — Ambulatory Visit (INDEPENDENT_AMBULATORY_CARE_PROVIDER_SITE_OTHER): Payer: BLUE CROSS/BLUE SHIELD | Admitting: Pulmonary Disease

## 2015-11-17 VITALS — BP 128/80 | HR 87 | Ht 72.0 in | Wt 192.0 lb

## 2015-11-17 DIAGNOSIS — J4530 Mild persistent asthma, uncomplicated: Secondary | ICD-10-CM | POA: Diagnosis not present

## 2015-11-17 NOTE — Progress Notes (Signed)
PROBLEMS: Mild persistent asthma  INTERVAL HISTORY: No major events  SUBJ: Routine office follow up. No new complaints. Describes his asthma control as "decent". He has no problems with ADLs but is unable to exercise vigorously. Rarely uses albuterol rescue inhaler. Minimal cough, no sputum. Denies CP, fever, purulent sputum, hemoptysis, LE edema and calf tenderness. No nocturnal awakenings due to asthma. Controller regimen is Symbicort and Montelukast. Also on Cetirizine for allergies and esomeprazole for GERD.   OBJ: Filed Vitals:   11/17/15 0908  BP: 128/80  Pulse: 87  Height: 6' (1.829 m)  Weight: 87.091 kg (192 lb)  SpO2: 98%   NAD HEENT WNL No JVD or LAN Slightly coarse BS, no wheezes RRR s M NABS, soft No C/C/E  DATA: No new data. Last CXR reviewed  IMPRESSION: Mild persistent asthma with exercise induced component. Control is generally adequate but I would like him to be able to pursue exercise regularly and asthma seems to be limiting this.   PLAN: Continue Symbicort and Singulair Discussed more liberal use of albuterol and in particular suggested use 10-15 minutes prior to exercise ROV 3 months with office spirometry    Merwyn Katosavid B Matthieu Loftus, MD [facility] [place] [facility]

## 2016-01-06 ENCOUNTER — Other Ambulatory Visit: Payer: Self-pay | Admitting: Family Medicine

## 2016-01-25 ENCOUNTER — Encounter: Payer: Self-pay | Admitting: Pulmonary Disease

## 2016-01-25 ENCOUNTER — Ambulatory Visit (INDEPENDENT_AMBULATORY_CARE_PROVIDER_SITE_OTHER): Payer: BLUE CROSS/BLUE SHIELD | Admitting: Pulmonary Disease

## 2016-01-25 VITALS — BP 124/76 | HR 77 | Ht 72.0 in | Wt 196.0 lb

## 2016-01-25 DIAGNOSIS — R400 Somnolence: Secondary | ICD-10-CM | POA: Diagnosis not present

## 2016-01-25 DIAGNOSIS — J4530 Mild persistent asthma, uncomplicated: Secondary | ICD-10-CM | POA: Diagnosis not present

## 2016-01-25 DIAGNOSIS — R0683 Snoring: Secondary | ICD-10-CM

## 2016-01-25 NOTE — Progress Notes (Signed)
PROBLEMS: Moderate persistent asthma Snoring with excessive daytime sleepiness  INTERVAL HISTORY: No major events  SUBJ: Routine office follow up. Has noted some increase in symptoms (dyspnea and NP cough) with change in weather. He has no problems with ADLs but is unable to exercise vigorously - he is benefited by using albuterol MDI prior to exercise. Uses albuterol rescue inhaler most days. Report daily NP cough attributed to increase in pollens. Denies CP, fever, purulent sputum, hemoptysis, LE edema and calf tenderness.  Controller regimen is Symbicort and Montelukast. Also on Cetirizine for allergies and esomeprazole for GERD. No nocturnal awakenings due to asthma but he does awaken himself sometimes with snoring and gasping and his wife reports heavy snoring. He also reports daytime sleepiness.  OBJCeasar Mons: Filed Vitals:   01/25/16 0946  BP: 124/76  Pulse: 77  Height: 6' (1.829 m)  Weight: 196 lb (88.905 kg)  SpO2: 96%   NAD HEENT normal TMs and canals. Nasal mucosa slightly erythematous. OP normal No JVD or LAN Normal BS, no wheezes RRR s M NABS, soft No C/C/E  DATA: No new data  IMPRESSION: Moderate persistent asthma - adequately controlled Snoring with hypersomnolence - suspect OSA  PLAN: Continue asthma regimen as above Sleep study ordered ROV 4-6 weeks    Merwyn Katosavid B Aniston Christman, MD [facility] [place]

## 2016-02-02 ENCOUNTER — Ambulatory Visit: Payer: BLUE CROSS/BLUE SHIELD | Attending: Pulmonary Disease

## 2016-02-02 DIAGNOSIS — R0683 Snoring: Secondary | ICD-10-CM | POA: Diagnosis not present

## 2016-02-02 DIAGNOSIS — J45909 Unspecified asthma, uncomplicated: Secondary | ICD-10-CM | POA: Insufficient documentation

## 2016-02-02 DIAGNOSIS — R400 Somnolence: Secondary | ICD-10-CM | POA: Insufficient documentation

## 2016-02-08 DIAGNOSIS — G4733 Obstructive sleep apnea (adult) (pediatric): Secondary | ICD-10-CM | POA: Diagnosis not present

## 2016-02-12 ENCOUNTER — Telehealth: Payer: Self-pay | Admitting: *Deleted

## 2016-02-12 DIAGNOSIS — G4733 Obstructive sleep apnea (adult) (pediatric): Secondary | ICD-10-CM

## 2016-02-12 NOTE — Telephone Encounter (Signed)
Spoke with pt and informed him I will place order for CPAP titration.

## 2016-02-12 NOTE — Telephone Encounter (Signed)
LMOM for pt to return call for results. 

## 2016-02-19 ENCOUNTER — Ambulatory Visit: Payer: BLUE CROSS/BLUE SHIELD | Attending: Pulmonary Disease

## 2016-02-19 DIAGNOSIS — G4733 Obstructive sleep apnea (adult) (pediatric): Secondary | ICD-10-CM | POA: Insufficient documentation

## 2016-02-24 DIAGNOSIS — G4733 Obstructive sleep apnea (adult) (pediatric): Secondary | ICD-10-CM | POA: Diagnosis not present

## 2016-02-26 ENCOUNTER — Encounter: Payer: Self-pay | Admitting: Pulmonary Disease

## 2016-02-26 ENCOUNTER — Telehealth: Payer: Self-pay | Admitting: *Deleted

## 2016-02-26 ENCOUNTER — Ambulatory Visit (INDEPENDENT_AMBULATORY_CARE_PROVIDER_SITE_OTHER): Payer: BLUE CROSS/BLUE SHIELD | Admitting: Pulmonary Disease

## 2016-02-26 VITALS — BP 124/70 | HR 74 | Ht 72.0 in | Wt 190.4 lb

## 2016-02-26 DIAGNOSIS — G4733 Obstructive sleep apnea (adult) (pediatric): Secondary | ICD-10-CM | POA: Diagnosis not present

## 2016-02-26 DIAGNOSIS — J4530 Mild persistent asthma, uncomplicated: Secondary | ICD-10-CM

## 2016-02-26 NOTE — Telephone Encounter (Signed)
Pt informed of CPAP being ordered while at OV today.

## 2016-02-26 NOTE — Patient Instructions (Addendum)
Your sleep study revealed mild sleep apnea. Because you have significant symptoms of sleep disruption, we will begin a trial of CPAP  Your spirometry today is normal. Continue Symbicort, Singulair, Zyrtec and albuterol as previously  Follow up in 6 weeks to assess your tolerance and response to the CPAP

## 2016-02-29 NOTE — Progress Notes (Signed)
PROBLEMS: Moderate persistent asthma OSA - sleep study 02/02/16: AHI 11.8/hr with frequent arousals and sleep fragmentation. Moderate snoring volume.   INTERVAL HISTORY: No major events  SUBJ: Scheduled ROV to review results of PSG. No new respiratory complaints. Denies CP, fever, purulent sputum, hemoptysis, LE edema and calf tenderness   OBJ: Filed Vitals:   02/26/16 1006  BP: 124/70  Pulse: 74  Height: 6' (1.829 m)  Weight: 190 lb 6.4 oz (86.365 kg)  SpO2: 98%   NAD HEENT: WNL No JVD or LAN Normal BS, no wheezes RRR s M NABS, soft No C/C/E   Current outpatient prescriptions:  .  albuterol (PROVENTIL HFA;VENTOLIN HFA) 108 (90 BASE) MCG/ACT inhaler, Inhale 2 puffs into the lungs every 6 (six) hours as needed (cough)., Disp: 1 Inhaler, Rfl: 5 .  cetirizine (ZYRTEC) 10 MG tablet, Take 10 mg by mouth daily., Disp: , Rfl:  .  esomeprazole (NEXIUM) 20 MG capsule, TAKE ONE CAPSULE BY MOUTH DAILY, Disp: 30 capsule, Rfl: 11 .  methocarbamol (ROBAXIN) 500 MG tablet, Take 1 tablet (500 mg total) by mouth 4 (four) times daily., Disp: 40 tablet, Rfl: 0 .  montelukast (SINGULAIR) 10 MG tablet, TAKE ONE TABLET BY MOUTH EVERY NIGHT AT BEDTIME, Disp: 30 tablet, Rfl: 11 .  Multiple Vitamin (MULTIVITAMIN) tablet, Take 1 tablet by mouth daily., Disp: , Rfl:  .  SYMBICORT 160-4.5 MCG/ACT inhaler, INHALE ONE PUFF INTO THE LUNGS TWICE A DAY. RINSE, GARGE, SPIT AFTER USE., Disp: 10.2 g, Rfl: 5 .  triamcinolone (NASACORT ALLERGY 24HR) 55 MCG/ACT AERO nasal inhaler, Place 2 sprays into the nose daily., Disp: , Rfl:    DATA: Spirometry 02/26/16: no obstruction  IMPRESSION: Moderate persistent asthma - well controlled OSA - AHI only 11.6 but there is significant sleep disturbance. He wants to try CPAP  PLAN: Continue asthma regimen as above Begin CPAP CPAP titration study ordered ROV 6 weeks  Billy Fischeravid Kyliyah Stirn, MD PCCM service Mobile [phone] Pager [phone] 02/29/2016

## 2016-03-03 DIAGNOSIS — G4733 Obstructive sleep apnea (adult) (pediatric): Secondary | ICD-10-CM | POA: Diagnosis not present

## 2016-03-09 ENCOUNTER — Telehealth: Payer: Self-pay | Admitting: Pulmonary Disease

## 2016-03-09 NOTE — Telephone Encounter (Signed)
Please advise on what pt can do for sinus pressure and coughing. Thanks.

## 2016-03-09 NOTE — Telephone Encounter (Signed)
An OTC cold and sinus formula such as Advil Cold and Sinus. Make sure it is one with both a decongestant and an antihistamine. It should say on the label  Billy Fischeravid Daichi Moris, MD PCCM service Mobile [phone] Pager [phone] 03/09/2016

## 2016-03-09 NOTE — Telephone Encounter (Signed)
Pt calling stating he was recently seen. Since then he's not been doing well.  Just went to the beach, but couldn't smell anything. Has some sinus pressure, has a lot of cough. Is using his inhaler but would like to see what else can he do for this Please advise.

## 2016-03-09 NOTE — Telephone Encounter (Signed)
LMOM for pt to return call. 

## 2016-03-10 NOTE — Telephone Encounter (Signed)
Pt is returning our call  ° °Please call back ° °

## 2016-03-10 NOTE — Telephone Encounter (Signed)
Pt giving us two number to try when calling back  1. [phone] 2. [phone]

## 2016-03-10 NOTE — Telephone Encounter (Signed)
Pt informed. Nothing further needed. 

## 2016-03-29 ENCOUNTER — Encounter: Payer: Self-pay | Admitting: Family Medicine

## 2016-03-29 ENCOUNTER — Ambulatory Visit (INDEPENDENT_AMBULATORY_CARE_PROVIDER_SITE_OTHER): Payer: BLUE CROSS/BLUE SHIELD | Admitting: Family Medicine

## 2016-03-29 DIAGNOSIS — B349 Viral infection, unspecified: Secondary | ICD-10-CM | POA: Diagnosis not present

## 2016-03-29 NOTE — Assessment & Plan Note (Signed)
Supportive care. Ibuprofen for myalgia and ST.  Stop cold med as may be worsening diarrhea.  Push fluids, Advance diet as tolerated.

## 2016-03-29 NOTE — Progress Notes (Signed)
   Subjective:    Patient ID: Antonio Lara, male    DOB: [birth year], 40 y.o.   MRN: [id number]  HPI  40 year old male pt of Dr. Cyndie Chimeopland's presents with multiple complaints.  He reports he has been feeling ill in last 1 week.  Started after eating out..  Had episode of bloating, sore throat, diarrhea.  Resolved.   Sore throat, dizziness returned in last 3 days. 2 days ago diarrhea multiple times, occurring since.  No blood in stool. No nausea, no emesis.  Some neck soreness off and on. Myalgia all over. Fatigue.  No fever. Some abdominal cramping, no pain.  He has not been eating. Has been drinking water and gingerale.  No sick contacts except son with mild fever.  He has been cold/flu med.  Review of Systems  Constitutional: Negative for fatigue and fever.  HENT: Negative for ear pain.   Eyes: Negative for pain.  Respiratory: Negative for shortness of breath.   Cardiovascular: Negative for chest pain.       Objective:   Physical Exam  Constitutional: Vital signs are normal. He appears well-developed and well-nourished.  HENT:  Head: Normocephalic.  Right Ear: Hearing normal.  Left Ear: Hearing normal.  Nose: Nose normal.  Mouth/Throat: Oropharynx is clear and moist and mucous membranes are normal.  Neck: Trachea normal. Carotid bruit is not present. No thyroid mass and no thyromegaly present.  Cardiovascular: Normal rate, regular rhythm and normal pulses.  Exam reveals no gallop, no distant heart sounds and no friction rub.   No murmur heard. No peripheral edema  Pulmonary/Chest: Effort normal and breath sounds normal. No respiratory distress.  Abdominal: There is no hepatosplenomegaly. There is tenderness in the left upper quadrant and left lower quadrant. There is no CVA tenderness.  Skin: Skin is warm, dry and intact. No rash noted.  Psychiatric: He has a normal mood and affect. His speech is normal and behavior is normal. Thought content normal.           Assessment & Plan:

## 2016-03-29 NOTE — Patient Instructions (Signed)
Push fluids.. Water/gatorade/gingerale. Gradually advance diet as able. Stop cold and flu med. Ibuprofen 800 mg every eight hours as needed for ST, body pain. Rest.  Call if have new fever > 101.4 F. Or not improving in next 4-5 days. Wash your hands to prevent spread of infection!

## 2016-04-03 DIAGNOSIS — G4733 Obstructive sleep apnea (adult) (pediatric): Secondary | ICD-10-CM | POA: Diagnosis not present

## 2016-04-28 ENCOUNTER — Ambulatory Visit (INDEPENDENT_AMBULATORY_CARE_PROVIDER_SITE_OTHER): Payer: BLUE CROSS/BLUE SHIELD | Admitting: Pulmonary Disease

## 2016-04-28 ENCOUNTER — Encounter: Payer: Self-pay | Admitting: Pulmonary Disease

## 2016-04-28 VITALS — BP 132/78 | HR 88 | Ht 72.0 in | Wt 192.0 lb

## 2016-04-28 DIAGNOSIS — G4733 Obstructive sleep apnea (adult) (pediatric): Secondary | ICD-10-CM

## 2016-04-28 DIAGNOSIS — J4540 Moderate persistent asthma, uncomplicated: Secondary | ICD-10-CM

## 2016-04-30 NOTE — Progress Notes (Signed)
PROBLEMS: Moderate persistent asthma OSA - sleep study 02/02/16: AHI 11.8/hr with frequent arousals and sleep fragmentation. Moderate snoring volume.   INTERVAL HISTORY: No major events  SUBJ: Scheduled ROV to assess tolerance to CPAP. He is tolerating CPAP with nasal pillows well and reports that sleep quality has improved with less daytime sleepiness. No new respiratory complaints but does report some sinus congestion. Denies CP, fever, purulent sputum, hemoptysis, LE edema and calf tenderness  OBJ: Vitals:   04/28/16 1417  BP: 132/78  Pulse: 88  SpO2: 100%  Weight: 192 lb (87.1 kg)  Height: 6' (1.829 m)   NAD HEENT: WNL No JVD or LAN Normal BS, no wheezes RRR s M NABS, soft No C/C/E   Current Outpatient Prescriptions:  .  albuterol (PROVENTIL HFA;VENTOLIN HFA) 108 (90 BASE) MCG/ACT inhaler, Inhale 2 puffs into the lungs every 6 (six) hours as needed (cough)., Disp: 1 Inhaler, Rfl: 5 .  esomeprazole (NEXIUM) 20 MG capsule, TAKE ONE CAPSULE BY MOUTH DAILY, Disp: 30 capsule, Rfl: 11 .  methocarbamol (ROBAXIN) 500 MG tablet, Take 1 tablet (500 mg total) by mouth 4 (four) times daily., Disp: 40 tablet, Rfl: 0 .  montelukast (SINGULAIR) 10 MG tablet, TAKE ONE TABLET BY MOUTH EVERY NIGHT AT BEDTIME, Disp: 30 tablet, Rfl: 11 .  Multiple Vitamin (MULTIVITAMIN) tablet, Take 1 tablet by mouth daily., Disp: , Rfl:  .  SYMBICORT 160-4.5 MCG/ACT inhaler, INHALE ONE PUFF INTO THE LUNGS TWICE A DAY. RINSE, GARGE, SPIT AFTER USE., Disp: 10.2 g, Rfl: 5 .  triamcinolone (NASACORT ALLERGY 24HR) 55 MCG/ACT AERO nasal inhaler, Place 2 sprays into the nose daily., Disp: , Rfl:    DATA:  IMPRESSION: Moderate persistent asthma - well controlled OSA - AHI only 11.6 but there is significant sleep disturbance. He wants to try CPAP  PLAN: Continue asthma regimen as above Continue CPAP with nasal pillows ROV 3 months  Antonio Fischeravid Cedarius Kersh, MD PCCM service Mobile [phone] Pager  [phone] 04/30/2016

## 2016-05-04 DIAGNOSIS — G4733 Obstructive sleep apnea (adult) (pediatric): Secondary | ICD-10-CM | POA: Diagnosis not present

## 2016-05-05 ENCOUNTER — Encounter: Payer: Self-pay | Admitting: Pulmonary Disease

## 2016-05-06 ENCOUNTER — Other Ambulatory Visit: Payer: Self-pay | Admitting: *Deleted

## 2016-05-06 DIAGNOSIS — J329 Chronic sinusitis, unspecified: Secondary | ICD-10-CM

## 2016-05-09 ENCOUNTER — Ambulatory Visit
Admission: RE | Admit: 2016-05-09 | Discharge: 2016-05-09 | Disposition: A | Discharge status: Home / Self Care | Payer: BLUE CROSS/BLUE SHIELD | Source: Ambulatory Visit | Attending: Pulmonary Disease | Admitting: Pulmonary Disease

## 2016-05-09 DIAGNOSIS — R42 Dizziness and giddiness: Secondary | ICD-10-CM | POA: Diagnosis not present

## 2016-05-09 DIAGNOSIS — J329 Chronic sinusitis, unspecified: Secondary | ICD-10-CM | POA: Diagnosis not present

## 2016-05-10 ENCOUNTER — Ambulatory Visit (INDEPENDENT_AMBULATORY_CARE_PROVIDER_SITE_OTHER): Payer: BLUE CROSS/BLUE SHIELD | Admitting: Internal Medicine

## 2016-05-10 ENCOUNTER — Encounter: Payer: Self-pay | Admitting: Internal Medicine

## 2016-05-10 VITALS — BP 122/80 | HR 109 | Temp 99.5°F | Wt 191.0 lb

## 2016-05-10 DIAGNOSIS — J309 Allergic rhinitis, unspecified: Secondary | ICD-10-CM

## 2016-05-10 MED ORDER — PREDNISONE 10 MG PO TABS
ORAL_TABLET | ORAL | 0 refills | Status: DC
Start: 2016-05-10 — End: 2016-07-27

## 2016-05-10 MED ORDER — HYDROCODONE-HOMATROPINE 5-1.5 MG/5ML PO SYRP
5.0000 mL | ORAL_SOLUTION | Freq: Three times a day (TID) | ORAL | 0 refills | Status: DC | PRN
Start: 2016-05-10 — End: 2016-11-09

## 2016-05-10 NOTE — Progress Notes (Signed)
HPI  Pt presents to the clinic today with c/o sinus pressure, runny nose, ear fullness, sore throat and cough. He reports this has been going on about 6 weeks. He was seen 8/8 for the same, diagnosed with a viral illness and advised to treat with OTC medication. He reports he never really improved. His sinuses do not hurt but they feel tingly. He is blowing clear mucous out of his nose. He has ringing in his ears but denies decreased hearing. He denies difficulty swallowing. The cough is productive of clear mucous. He denies fever, chills or body aches. He has tried Tylenol cold and sinus without any relief. He has a history of allergies and asthma, and does take Claritin, Singulair and Nasocort. He has had sick contacts.  Review of Systems      Past Medical History:  Diagnosis Date  . Asthma, exercise induced         ?  . Back pain, chronic    Dr. Annell Greening  . GERD (gastroesophageal reflux disease)   . Mononucleosis    in high school    Family History  Problem Relation Age of Onset  . Breast cancer Mother   . Stroke Father   . Cancer Maternal Grandfather     Social History   Social History  . Marital status: Single    Spouse name: N/A  . Number of children: 2  . Years of education: N/A   Occupational History  . Sales Time [employer]   Social History Main Topics  . Smoking status: Never Smoker  . Smokeless tobacco: Never Used  . Alcohol use Yes     Comment: rarely  . Drug use: No  . Sexual activity: Not on file   Other Topics Concern  . Not on file   Social History Narrative   Drinks one caffeinated beverage a day.    Allergies  Allergen Reactions  . Oxycodone-Acetaminophen     REACTION: extemely dizzy with nausea and vomiting.     Constitutional: Positive fatigue. Denies headache, fever or  abrupt weight changes.  HEENT:  Positive runny nose, ear fullness, sore throat. Denies eye redness, eye pain, pressure behind the eyes, facial pain, nasal  congestion, ear pain, ringing in the ears, wax buildup, or bloody nose. Respiratory: Positive cough. Denies difficulty breathing or shortness of breath.  Cardiovascular: Denies chest pain, chest tightness, palpitations or swelling in the hands or feet.   No other specific complaints in a complete review of systems (except as listed in HPI above).  Objective:   BP 122/80   Pulse (!) 109   Temp 99.5 F (37.5 C) (Oral)   Wt 191 lb (86.6 kg)   SpO2 98%   BMI 25.90 kg/m  Wt Readings from Last 3 Encounters:  05/10/16 191 lb (86.6 kg)  04/28/16 192 lb (87.1 kg)  03/29/16 193 lb (87.5 kg)     General: Appears his stated age,  in NAD. HEENT: Head: normal shape and size, no sinus tenderness noted; Eyes: sclera white, no icterus, conjunctiva pink; Ears: Tm's gray and intact, normal light reflex, + serous effusion bilaterally; Nose: mucosa pink and moist, septum midline; Throat/Mouth: + PND. Teeth present, mucosa erythematous and moist, no exudate noted, no lesions or ulcerations noted.  Neck: No cervical lymphadenopathy.  Cardiovascular: Normal rate and rhythm. S1,S2 noted.  No murmur, rubs or gallops noted.  Pulmonary/Chest: Normal effort and positive vesicular breath sounds. No respiratory distress. No wheezes, rales or ronchi noted.  Assessment & Plan:   Allergic Rhinitis:  Get some rest and drink plenty of water Do salt water gargles for the sore throat Continue Claritin, Singulair and Nasocort eRx for Pred Taper x 6 days Rx for Hycodan cough syrup  RTC as needed or if symptoms persist.   Nicki ReaperBAITY, Audrinna Sherman, NP

## 2016-05-10 NOTE — Patient Instructions (Signed)

## 2016-05-20 ENCOUNTER — Encounter: Payer: Self-pay | Admitting: Pulmonary Disease

## 2016-06-03 DIAGNOSIS — G4733 Obstructive sleep apnea (adult) (pediatric): Secondary | ICD-10-CM | POA: Diagnosis not present

## 2016-07-04 DIAGNOSIS — G4733 Obstructive sleep apnea (adult) (pediatric): Secondary | ICD-10-CM | POA: Diagnosis not present

## 2016-07-17 ENCOUNTER — Other Ambulatory Visit: Payer: Self-pay | Admitting: Family Medicine

## 2016-07-17 NOTE — Telephone Encounter (Signed)
Last office visit 05/10/16 with R. Baity.  Last CPE???  Last time GERD was addressed 03/22/13.  Refill?

## 2016-07-26 ENCOUNTER — Other Ambulatory Visit: Payer: Self-pay | Admitting: Family Medicine

## 2016-07-27 ENCOUNTER — Encounter: Payer: Self-pay | Admitting: Pulmonary Disease

## 2016-07-27 ENCOUNTER — Ambulatory Visit (INDEPENDENT_AMBULATORY_CARE_PROVIDER_SITE_OTHER): Payer: BLUE CROSS/BLUE SHIELD | Admitting: Pulmonary Disease

## 2016-07-27 VITALS — BP 126/74 | HR 80 | Ht 71.0 in | Wt 193.6 lb

## 2016-07-27 DIAGNOSIS — J4530 Mild persistent asthma, uncomplicated: Secondary | ICD-10-CM

## 2016-07-27 DIAGNOSIS — G4733 Obstructive sleep apnea (adult) (pediatric): Secondary | ICD-10-CM

## 2016-07-27 NOTE — Progress Notes (Signed)
PROBLEMS: Moderate persistent asthma OSA Moderate snoring volume.   DATA: Sleep study 02/02/16: AHI 11.8/hr with frequent arousals and sleep fragmentation. Spirometry 06/14/13: FEV1 3.85 liters (85% pred), FEV1/FVC 79% Spirometry 02/26/16:  FEV1 3.81 liters (84% pred), FEV1/FVC 81% CPAP compliance 11/05-12/04/17: Set pressure 9 cm H2O. Usage 20/30 days. 17 days > 4 hrs.   INTERVAL HISTORY: No major events  SUBJ: Routine ROV for F/U of asthma and OSA. Asthma seems well-controlled..."much better than a year ago". Rarely using albuterol MDI. Feels better rested and tolerating CPAP. No new complaints or problems. Denies CP, fever, purulent sputum, hemoptysis, LE edema and calf tenderness  OBJ: Vitals:   07/27/16 0829  BP: 126/74  Pulse: 80  SpO2: 98%  Weight: 193 lb 9.6 oz (87.8 kg)  Height: 5\' 11"  (1.803 m)   NAD HEENT: WNL No JVD or LAN Normal BS, no wheezes RRR s M NABS, soft No C/C/E   Current Outpatient Prescriptions:  .  albuterol (PROVENTIL HFA;VENTOLIN HFA) 108 (90 BASE) MCG/ACT inhaler, Inhale 2 puffs into the lungs every 6 (six) hours as needed (cough)., Disp: 1 Inhaler, Rfl: 5 .  esomeprazole (NEXIUM) 20 MG capsule, TAKE ONE CAPSULE BY MOUTH DAILY, Disp: 30 capsule, Rfl: 11 .  HYDROcodone-homatropine (HYCODAN) 5-1.5 MG/5ML syrup, Take 5 mLs by mouth every 8 (eight) hours as needed for cough., Disp: 120 mL, Rfl: 0 .  methocarbamol (ROBAXIN) 500 MG tablet, Take 1 tablet (500 mg total) by mouth 4 (four) times daily., Disp: 40 tablet, Rfl: 0 .  montelukast (SINGULAIR) 10 MG tablet, TAKE ONE TABLET BY MOUTH EVERY NIGHT AT BEDTIME, Disp: 30 tablet, Rfl: 11 .  Multiple Vitamin (MULTIVITAMIN) tablet, Take 1 tablet by mouth daily., Disp: , Rfl:  .  SYMBICORT 160-4.5 MCG/ACT inhaler, INHALE ONE PUFF INTO THE LUNGS TWICE A DAY. RINSE, GARGE, SPIT AFTER USE., Disp: 10.2 g, Rfl: 5 Flonase nasal inhaler daily  DATA: No new labs or CXR  IMPRESSION: Moderate persistent asthma -  well controlled on regimen as noted above OSA - mild but symptomatic. Symptoms improved on CPAP  PLAN: Continue asthma regimen as above Continue CPAP with nasal pillows ROV 4 months  Billy Fischeravid Simonds, MD PCCM service Mobile [phone] Pager [phone] 07/27/2016

## 2016-07-27 NOTE — Patient Instructions (Addendum)
Continue Symbicort, Singulair and albuterol as needed Continue CPAP - focus on improving compliance Follow up in 4 months or as needed

## 2016-08-03 DIAGNOSIS — G4733 Obstructive sleep apnea (adult) (pediatric): Secondary | ICD-10-CM | POA: Diagnosis not present

## 2016-08-24 ENCOUNTER — Other Ambulatory Visit: Payer: Self-pay | Admitting: Family Medicine

## 2016-08-25 ENCOUNTER — Encounter: Payer: Self-pay | Admitting: Emergency Medicine

## 2016-08-25 ENCOUNTER — Emergency Department: Payer: BLUE CROSS/BLUE SHIELD

## 2016-08-25 ENCOUNTER — Emergency Department
Admission: EM | Admit: 2016-08-25 | Discharge: 2016-08-25 | Disposition: A | Discharge status: Home / Self Care | Payer: BLUE CROSS/BLUE SHIELD | Attending: Emergency Medicine | Admitting: Emergency Medicine

## 2016-08-25 ENCOUNTER — Telehealth: Payer: Self-pay | Admitting: Family Medicine

## 2016-08-25 DIAGNOSIS — Z79899 Other long term (current) drug therapy: Secondary | ICD-10-CM | POA: Diagnosis not present

## 2016-08-25 DIAGNOSIS — R05 Cough: Secondary | ICD-10-CM | POA: Diagnosis not present

## 2016-08-25 DIAGNOSIS — J9801 Acute bronchospasm: Secondary | ICD-10-CM | POA: Diagnosis not present

## 2016-08-25 MED ORDER — METHYLPREDNISOLONE 4 MG PO TBPK: ORAL_TABLET | ORAL | 0 refills | Status: DC

## 2016-08-25 MED ORDER — ACETAMINOPHEN 325 MG PO TABS
ORAL_TABLET | ORAL | Status: AC
  Filled 2016-08-25: qty 2

## 2016-08-25 MED ORDER — PSEUDOEPH-BROMPHEN-DM 30-2-10 MG/5ML PO SYRP: 5.0000 mL | ORAL_SOLUTION | Freq: Four times a day (QID) | ORAL | 0 refills | Status: DC | PRN

## 2016-08-25 MED ORDER — ACETAMINOPHEN 325 MG PO TABS
650.0000 mg | ORAL_TABLET | Freq: Once | ORAL | Status: AC | PRN
  Administered 2016-08-25: 650 mg via ORAL

## 2016-08-25 MED ORDER — IBUPROFEN 600 MG PO TABS: 600.0000 mg | ORAL_TABLET | Freq: Three times a day (TID) | ORAL | 0 refills | Status: DC | PRN

## 2016-08-25 NOTE — ED Provider Notes (Signed)
[facility] Emergency Department Provider Note   ____________________________________________   First MD Initiated Contact with Patient 08/25/16 1409     (approximate)  I have reviewed the triage vital signs and the nursing notes.   HISTORY  Chief Complaint Cough    HPI Antonio Lara is a 41 y.o. male patient complaining of cough, clogged ears, body gait, and sore throat for 1-2 weeks. Patient stated times his cough has been productive and greenish in color. Patient has a history of asthma state use inhaler prior to arrival and felt better. Patient concerned he might have "walking pneumonia"no other palliative measures for his complaint. Patient denies any pain with this complaint.   Past Medical History:  Diagnosis Date  . Asthma, exercise induced         ?  . Back pain, chronic    Dr. Annell GreeningMark Yates  . GERD (gastroesophageal reflux disease)   . Mononucleosis    in high school    Patient Active Problem List   Diagnosis Date Noted  . Mild persistent asthma in adult without complication 06/14/2013  . Acute viral syndrome 07/06/2011  . GERD 07/10/2009  . BACK PAIN, LUMBAR 06/10/2008  . Allergic rhinitis 04/08/2008    History reviewed. No pertinent surgical history.  Prior to Admission medications   Medication Sig Start Date End Date Taking? Authorizing Provider  brompheniramine-pseudoephedrine-DM 30-2-10 MG/5ML syrup Take 5 mLs by mouth 4 (four) times daily as needed. 08/25/16   Joni Reiningonald K Smith, PA-C  esomeprazole (NEXIUM) 20 MG capsule TAKE ONE CAPSULE BY MOUTH DAILY 07/18/16   Hannah BeatSpencer Copland, MD  HYDROcodone-homatropine [facility](HYCODAN) 5-1.5 MG/5ML syrup Take 5 mLs by mouth every 8 (eight) hours as needed for cough. 05/10/16   Lorre Munroeegina W Baity, NP  ibuprofen (ADVIL,MOTRIN) 600 MG tablet Take 1 tablet (600 mg total) by mouth every 8 (eight) hours as needed. 08/25/16   Joni Reiningonald K Smith, PA-C  methocarbamol (ROBAXIN) 500 MG tablet Take 1 tablet (500 mg  total) by mouth 4 (four) times daily. 08/18/15   Hannah BeatSpencer Copland, MD  methylPREDNISolone (MEDROL DOSEPAK) 4 MG TBPK tablet Take Tapered dose as directed 08/25/16   Joni Reiningonald K Smith, PA-C  montelukast (SINGULAIR) 10 MG tablet TAKE ONE TABLET BY MOUTH EVERY NIGHT AT BEDTIME 10/22/15   Hannah BeatSpencer Copland, MD  Multiple Vitamin (MULTIVITAMIN) tablet Take 1 tablet by mouth daily.    Historical Provider, MD  PROAIR HFA 108 (90 Base) MCG/ACT inhaler INHALE 2 PUFFS INTO THE LUNGS EVERY 6 HOURS AS NEEDED FOR COUGH 08/24/16   Hannah BeatSpencer Copland, MD  SYMBICORT 160-4.5 MCG/ACT inhaler INHALE ONE PUFF INTO THE LUNGS TWICE A DAY. RINSE, GARGE, SPIT AFTER USE. 07/26/16   Hannah BeatSpencer Copland, MD    Allergies Oxycodone-acetaminophen  Family History  Problem Relation Age of Onset  . Breast cancer Mother   . Stroke Father   . Cancer Maternal Grandfather     Social History Social History  Substance Use Topics  . Smoking status: Never Smoker  . Smokeless tobacco: Never Used  . Alcohol use Yes     Comment: rarely    Review of Systems Constitutional: Fever/chills and body aches. Eyes: No visual changes. ZOX:WRUEENT:Sore throat. Cardiovascular: Denies chest pain. Respiratory: Denies shortness of breath. Productive cough with mild wheezing. Gastrointestinal: No abdominal pain.  No nausea, no vomiting.  No diarrhea.  No constipation. Genitourinary: Negative for dysuria. Musculoskeletal: Negative for back pain. Skin: Negative for rash. Neurological: Negative for headaches, focal weakness or numbness.  ____________________________________________   PHYSICAL EXAM:  VITAL SIGNS: ED Triage Vitals  Enc Vitals Group     BP 08/25/16 1317 (!) 137/95     Pulse Rate 08/25/16 1317 (!) 142     Resp 08/25/16 1317 (!) 24     Temp 08/25/16 1317 (!) 101.4 F (38.6 C)     Temp Source 08/25/16 1317 Oral     SpO2 08/25/16 1317 100 %     Weight 08/25/16 1317 192 lb (87.1 kg)     Height 08/25/16 1317 5\' 11"  (1.803 m)     Head  Circumference --      Peak Flow --      Pain Score 08/25/16 1326 2     Pain Loc --      Pain Edu? --      Excl. in GC? --     Constitutional: Alert and oriented. Well appearing and in no acute distress.Febrile at 101.4 Eyes: Conjunctivae are normal. PERRL. EOMI. Head: Atraumatic. Nose: Edematous nasal turbinates with clear rhinorrhea Mouth/Throat: Mucous membranes are moist.  Oropharynx non-erythematous. Neck: No stridor.  No cervical spine tenderness to palpation. Hematological/Lymphatic/Immunilogical: No cervical lymphadenopathy. Cardiovascular: Normal rate, regular rhythm. Grossly normal heart sounds.  Good peripheral circulation. Respiratory: Normal respiratory effort.  No retractions. Lungs with mild wheezing and cough. Gastrointestinal: Soft and nontender. No distention. No abdominal bruits. No CVA tenderness. Musculoskeletal: No lower extremity tenderness nor edema.  No joint effusions. Neurologic:  Normal speech and language. No gross focal neurologic deficits are appreciated. No gait instability. Skin:  Skin is warm, dry and intact. No rash noted. Psychiatric: Mood and affect are normal. Speech and behavior are normal.  ____________________________________________   LABS (all labs ordered are listed, but only abnormal results are displayed)  Labs Reviewed - No data to display ____________________________________________  EKG   ____________________________________________  RADIOLOGY  No acute findings on chest x-ray. ____________________________________________   PROCEDURES  Procedure(s) performed: None  Procedures  Critical Care performed: No  ____________________________________________   INITIAL IMPRESSION / ASSESSMENT AND PLAN / ED COURSE  Pertinent labs & imaging results that were available during my care of the patient were reviewed by me and considered in my medical decision making (see chart for details).  Coughs secondary to bronchospasms.  Patient given discharge care instructions. Patient a prescription for Medrol Dosepak) felt DM. Patient given a work no. Patient advised to follow-up with family doctor condition persists.  Clinical Course      ____________________________________________   FINAL CLINICAL IMPRESSION(S) / ED DIAGNOSES  Final diagnoses:  Cough due to bronchospasm      NEW MEDICATIONS STARTED DURING THIS VISIT:  New Prescriptions   BROMPHENIRAMINE-PSEUDOEPHEDRINE-DM 30-2-10 MG/5ML SYRUP    Take 5 mLs by mouth 4 (four) times daily as needed.   IBUPROFEN (ADVIL,MOTRIN) 600 MG TABLET    Take 1 tablet (600 mg total) by mouth every 8 (eight) hours as needed.   METHYLPREDNISOLONE (MEDROL DOSEPAK) 4 MG TBPK TABLET    Take Tapered dose as directed     Note:  This document was prepared using Dragon voice recognition software and may include unintentional dictation errors.    Joni Reining, PA-C 08/25/16 1421    Emily Filbert, MD 08/25/16 [phone]

## 2016-08-25 NOTE — Telephone Encounter (Signed)
Please call the patient - unless he feels profoundly short of breath, he should be able to be seen in the office to be checked. 41 yo generally healthy patient.

## 2016-08-25 NOTE — ED Triage Notes (Signed)
Pt c/o cough, "clogged ears", body aches, sore throat for 1-2 weeks. Has had green productive cough now. Felt better after using inhaler from asthma. Has not taken anything for fever today, did not know he had a fever. No respiratory distress.  Ambulatory to triage. Pt reports he does not feel that bad but has had "walking pneumonia" before and wants to make sure he does not need antibiotics. Eating/drinking WNL.

## 2016-08-25 NOTE — Telephone Encounter (Signed)
°  °  °     °    °  °  °   °   °   °   °   °   ° ° °  °  [facility]                             Related visit to physician within the last 2 weeks? ---No  Does the PT have any chronic conditions? (i.e. diabetes, asthma, etc.) ---Yes  List chronic conditions. ---asthma,  Is this a behavioral health or substance abuse call? ---No    Guidelines     Guideline Title Affirmed Question Affirmed Notes   Asthma Attack Chest pain    Final Disposition User   Patient Name: Antonio Lara DOB: [birth year] Initial Comment Caller states he feels lethargic, productive cough, body aches, swollen sinus, he has been sore in his chest (lungs feels tight). Nurse Assessment Nurse: Leveda AnnaHensel, RN, Aeriel Date/Time (Eastern Time): 08/25/2016 11:52:12 AM Confirm and document reason for call. If symptomatic, describe symptoms. ---Caller states he feels lethargic, productive cough, body aches, swollen sinus, he has been sore in his chest only when he is coughing. He has on and off and sinus congestion. Caller states, he may have a fever. He has tinnitus as well. Does the patient have any new or worsening symptoms? ---Yes Will a triage be completed? ---Yes Related visit to physician within the last 2 weeks? ---No Does the PT have any chronic conditions? (i.e. diabetes, asthma, etc.) ---Yes List chronic conditions. ---asthma, Is this a behavioral health or substance abuse call? ---No Guidelines Guideline Title Affirmed Question Affirmed Notes Asthma Attack Chest pain Final Disposition User Go to ED Now Hensel, RN, Aeriel Comments Correction per caller no chest pain. Correction per caller he does have some chest pain that is not constant it is worse on the right side. It comes after he is coughing. Referrals [facility] - ED Disagree/Comply: Comply

## 2016-08-25 NOTE — Telephone Encounter (Signed)
PLEASE NOTE: All timestamps contained within this report are represented as Eastern Standard Time. CONFIDENTIALTY NOTICE: This fax transmission is intended only for the addressee. It contains information that is legally privileged, confidential or otherwise protected from use or disclosure. If you are not the intended recipient, you are strictly prohibited from reviewing, disclosing, copying using or disseminating any of this information or taking any action in reliance on or regarding this information. If you have received this fax in error, please notify us immediately by telephone so that we can arrange for its return to us. Phone: [phone], Toll-Free: [phone], Fax: [phone] Page: 1 of 2 Call Id: [id number] [facility] - Client TELEPHONE ADVICE RECORD [facility] Patient Name: Antonio Lara Gender: Male DOB: 06/23/1976 Age: 40 Y 8 M 25 D Return Phone Number: [phone] (Primary), [phone] (Secondary) Address: City/State/Zip: [place] Client [facility] - Client Client Site [facility] - Day Physician Copland, Spencer - MD Contact Type Call Who Is Calling Patient / Member / Family / Caregiver Call Type Triage / Clinical Relationship To Patient Self Return Phone Number [phone] (Primary) Chief Complaint Chest Pain (non urgent symptoms) Reason for Call Symptomatic / Request for Health Information Initial Comment Caller states he feels lethargic, productive cough, body aches, swollen sinus, he has been sore in his chest (lungs feels tight). Appointment Disposition EMR Appointment Not Necessary Info pasted into Epic Yes PreDisposition Did not know what to do Translation No Nurse Assessment Nurse: Hensel, RN, Aeriel Date/Time (Eastern Time): 08/25/2016 11:52:12 AM Confirm and document reason for call. If symptomatic, describe symptoms. ---Caller states he feels lethargic,  productive cough, body aches, swollen sinus, he has been sore in his chest only when he is coughing. He has on and off and sinus congestion. Caller states, he may have a fever. He has tinnitus as well. Does the patient have any new or worsening symptoms? ---Yes Will a triage be completed? ---Yes Related visit to physician within the last 2 weeks? ---No Does the PT have any chronic conditions? (i.e. diabetes, asthma, etc.) ---Yes List chronic conditions. ---asthma, Is this a behavioral health or substance abuse call? ---No Guidelines Guideline Title Affirmed Question Affirmed Notes Nurse Date/Time (Eastern Time) Asthma Attack Chest pain Hensel, RN, Aeriel 08/25/2016 11:54:09 AM Disp. Time (Eastern Time) Disposition Final User 08/25/2016 11:59:55 AM Send To RN Personal Hensel, RN, Aeriel 08/25/2016 12:20:50 PM Call Completed Caldwell, RN, Lynda 08/25/2016 11:59:22 AM Go to ED Now Yes Hensel, RN, Aeriel PLEASE NOTE: All timestamps contained within this report are represented as Eastern Standard Time. CONFIDENTIALTY NOTICE: This fax transmission is intended only for the addressee. It contains information that is legally privileged, confidential or otherwise protected from use or disclosure. If you are not the intended recipient, you are strictly prohibited from reviewing, disclosing, copying using or disseminating any of this information or taking any action in reliance on or regarding this information. If you have received this fax in error, please notify us immediately by telephone so that we can arrange for its return to us. Phone: [phone], Toll-Free: [phone], Fax: [phone] Page: 2 of 2 Call Id: [id number] Caller Understands: Yes Disagree/Comply: Comply Care Advice Given Per Guideline GO TO ED NOW: You need to be seen in the Emergency Department. Go to the ER at ___________ Hospital. Leave now. Drive carefully. ASTHMA QUICK-RELIEF INHALER (e.g., albuterol, salbutamol, Xopenex): Take  4 puffs on your quick-relief inhaler right now. Comments User: Aeriel, Hensel,   RN Date/Time (Eastern Time): 08/25/2016 11:55:35 AM Correction per caller no chest pain. User: Aeriel, Hensel, RN Date/Time (Eastern Time): 08/25/2016 11:59:21 AM Correction per caller he does have some chest pain that is not constant it is worse on the right side. It comes after he is coughing. Referrals [facility] - ED 

## 2016-08-25 NOTE — Telephone Encounter (Signed)
Antonio Lara is already at the ER being seen.

## 2016-08-31 ENCOUNTER — Telehealth: Payer: Self-pay | Admitting: *Deleted

## 2016-08-31 NOTE — Telephone Encounter (Signed)
Received fax from [facility] requesting PA for Symbicort.  PA completed on CoverMyMeds.  Awaiting decision.

## 2016-09-01 NOTE — Telephone Encounter (Signed)
[facility] back [facility] that states Symbicort does not require at PA.  [facility] fax[facility] to [facility].

## 2016-09-03 DIAGNOSIS — G4733 Obstructive sleep apnea (adult) (pediatric): Secondary | ICD-10-CM | POA: Diagnosis not present

## 2016-10-04 DIAGNOSIS — G4733 Obstructive sleep apnea (adult) (pediatric): Secondary | ICD-10-CM | POA: Diagnosis not present

## 2016-10-12 ENCOUNTER — Encounter: Payer: Self-pay | Admitting: Family Medicine

## 2016-10-12 MED ORDER — TIZANIDINE HCL 4 MG PO TABS: 4.0000 mg | ORAL_TABLET | Freq: Two times a day (BID) | ORAL | 1 refills | Status: DC | PRN

## 2016-10-12 NOTE — Telephone Encounter (Signed)
zanaflex 4 mg tab, 1 po bid prn muscle spasm, #60, 1 ref

## 2016-10-24 IMAGING — CR DG CHEST 2V
1 series · 2 of 2 positions shown · non-contrast
Comparison: Two-view chest X-ray 06/12/2014

CLINICAL DATA: Asthma exacerbation.

EXAM:
CHEST - 2 VIEW

[Series 1: dg chest 2 view · 0.14mm/px · 2 of 2 slices shown]
[im 1/2]
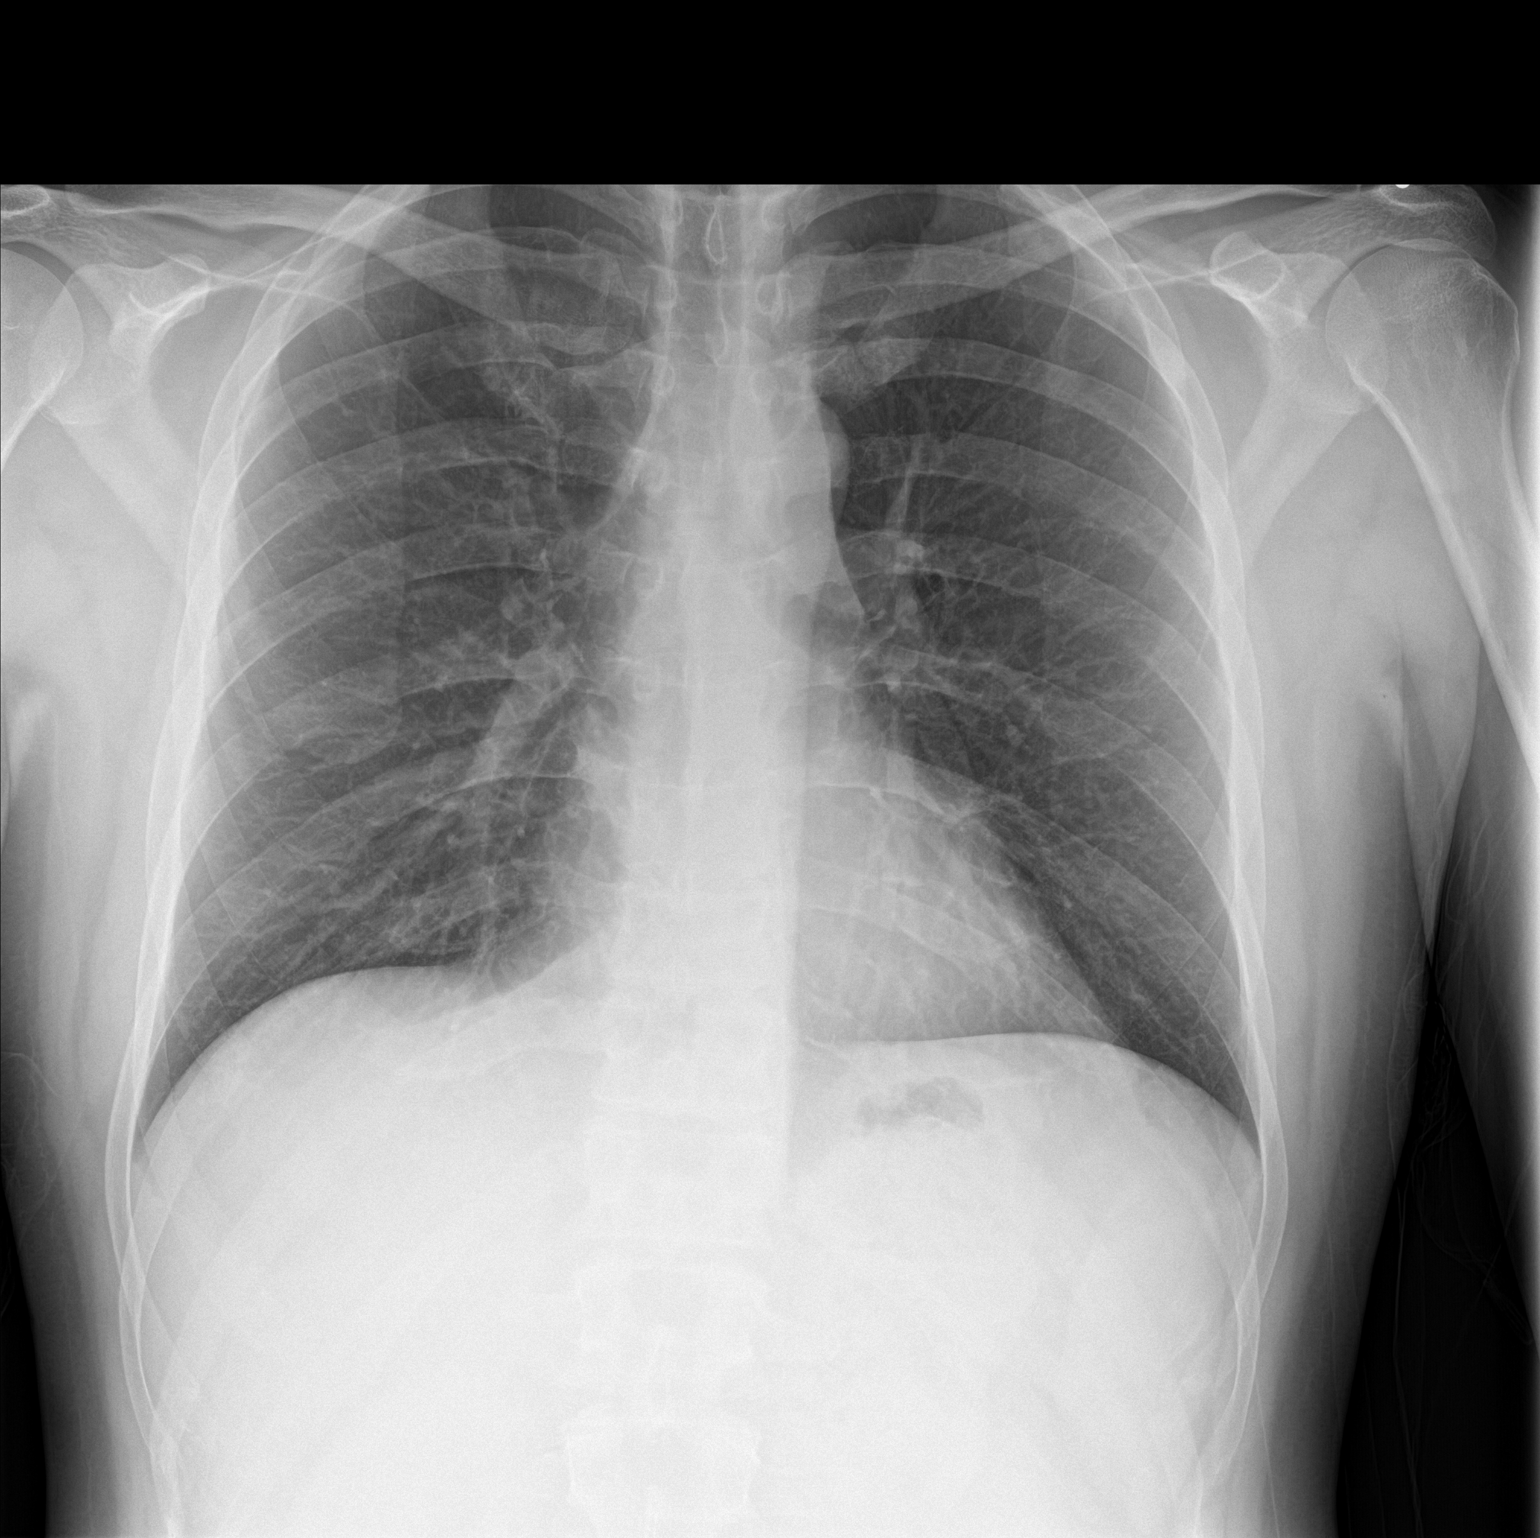
[im 2/2]
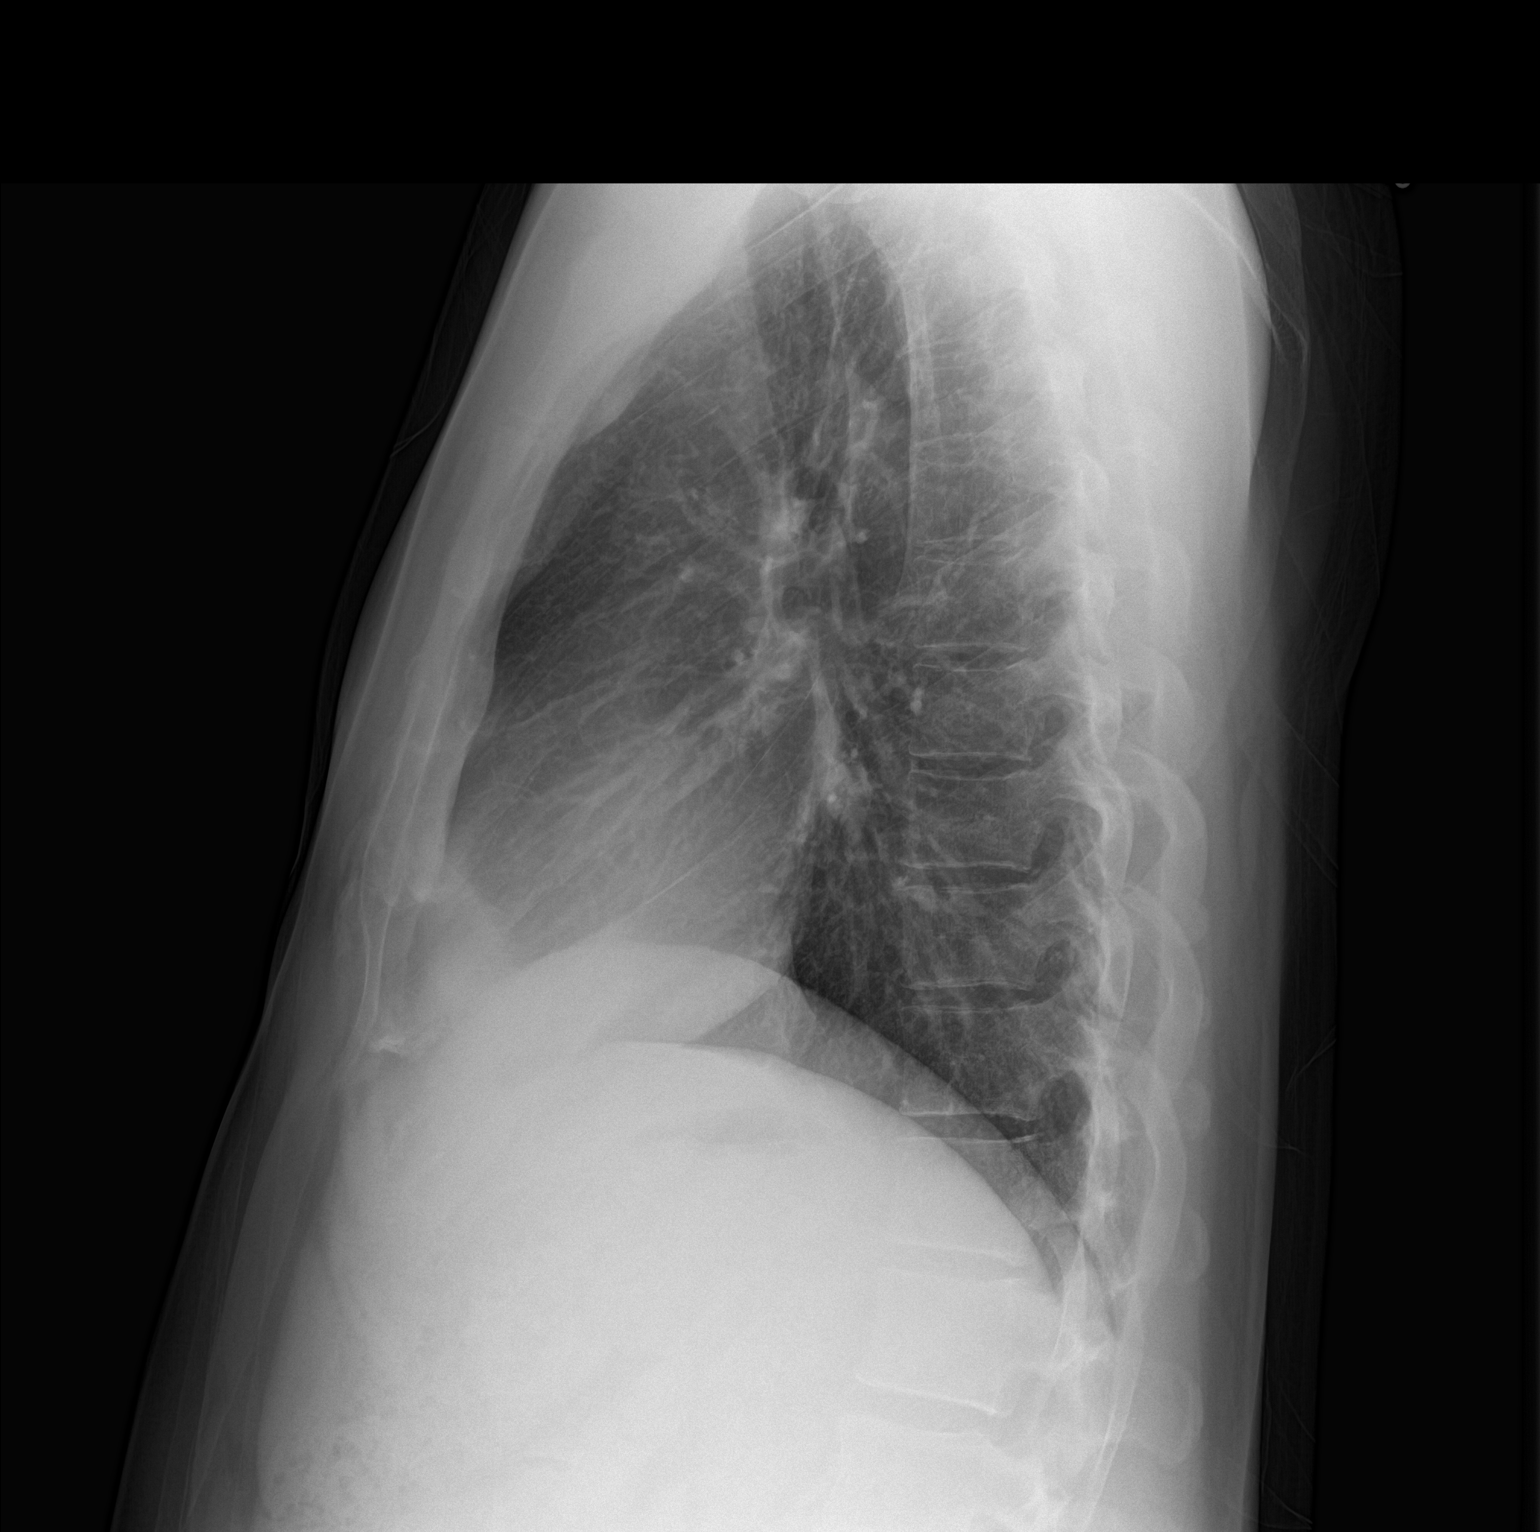

[2 of 2 positions shown; findings below may reference images not displayed]

FINDINGS: The heart size and mediastinal contours are within normal limits.
Both lungs are clear. The visualized skeletal structures are
unremarkable.
IMPRESSION: Negative two view chest x-ray

## 2016-11-01 ENCOUNTER — Other Ambulatory Visit: Payer: Self-pay | Admitting: Family Medicine

## 2016-11-01 DIAGNOSIS — G4733 Obstructive sleep apnea (adult) (pediatric): Secondary | ICD-10-CM | POA: Diagnosis not present

## 2016-11-09 ENCOUNTER — Encounter: Payer: Self-pay | Admitting: Family Medicine

## 2016-11-09 ENCOUNTER — Ambulatory Visit (INDEPENDENT_AMBULATORY_CARE_PROVIDER_SITE_OTHER): Payer: BLUE CROSS/BLUE SHIELD | Admitting: Family Medicine

## 2016-11-09 ENCOUNTER — Ambulatory Visit
Admission: RE | Admit: 2016-11-09 | Discharge: 2016-11-09 | Disposition: A | Discharge status: Home / Self Care | Payer: BLUE CROSS/BLUE SHIELD | Source: Ambulatory Visit | Attending: Family Medicine | Admitting: Family Medicine

## 2016-11-09 VITALS — BP 108/76 | HR 77 | Temp 98.1°F | Ht 72.0 in | Wt 198.0 lb

## 2016-11-09 DIAGNOSIS — N5082 Scrotal pain: Secondary | ICD-10-CM

## 2016-11-09 DIAGNOSIS — N503 Cyst of epididymis: Secondary | ICD-10-CM | POA: Diagnosis not present

## 2016-11-09 DIAGNOSIS — I861 Scrotal varices: Secondary | ICD-10-CM | POA: Diagnosis not present

## 2016-11-09 DIAGNOSIS — N433 Hydrocele, unspecified: Secondary | ICD-10-CM | POA: Insufficient documentation

## 2016-11-09 MED ORDER — LEVOFLOXACIN 500 MG PO TABS: 500.0000 mg | ORAL_TABLET | Freq: Every day | ORAL | 0 refills | Status: DC

## 2016-11-09 NOTE — Progress Notes (Signed)
Dr. Karleen HampshireSpencer T. Dayven Linsley, MD, CAQ Sports Medicine Primary Care and Sports Medicine [address] [place], [address] Phone: [phone] Fax: [phone]  11/09/2016  Patient: Lionel DecemberWhitaker L Trejos, MRN: [id number], DOB: December 11, 1975, 41 y.o.  Primary Physician:  Hannah BeatSpencer Sion Reinders, MD   Chief Complaint  Patient presents with  . Groin Swelling    Pt stated have soreness for about 2 months   Subjective:   Lionel DecemberWhitaker L Lanes is a 41 y.o. very pleasant male patient who presents with the following:  Something on one of her testicles - a pulling sensation. A tugging sensation. Pulling sensation. No trauma or injury. More on the L.  ? Not connected to one or the other.   Happily married for many years without any possible exposures.   Past Medical History, Surgical History, Social History, Family History, Problem List, Medications, and Allergies have been reviewed and updated if relevant.  Patient Active Problem List   Diagnosis Date Noted  . Mild persistent asthma in adult without complication 06/14/2013  . Acute viral syndrome 07/06/2011  . GERD 07/10/2009  . BACK PAIN, LUMBAR 06/10/2008  . Allergic rhinitis 04/08/2008    Past Medical History:  Diagnosis Date  . Asthma, exercise induced         ?  . Back pain, chronic    Dr. Annell GreeningMark Yates  . GERD (gastroesophageal reflux disease)   . Mononucleosis    in high school    No past surgical history on file.  Social History   Social History  . Marital status: Single    Spouse name: N/A  . Number of children: 2  . Years of education: N/A   Occupational History  . Sales Time [employer]   Social History Main Topics  . Smoking status: Never Smoker  . Smokeless tobacco: Never Used  . Alcohol use Yes     Comment: rarely  . Drug use: No  . Sexual activity: Not on file   Other Topics Concern  . Not on file   Social History Narrative   Drinks one caffeinated beverage a day.    Family History  Problem Relation Age  of Onset  . Breast cancer Mother   . Stroke Father   . Cancer Maternal Grandfather     Allergies  Allergen Reactions  . Oxycodone-Acetaminophen     REACTION: extemely dizzy with nausea and vomiting.    Medication list reviewed and updated in full in [facility].   GEN: No acute illnesses, no fevers, chills. GI: No n/v/d, eating normally Pulm: No SOB Interactive and getting along well at home.  Otherwise, ROS is as per the HPI.  Objective:   BP 108/76   Pulse 77   Temp 98.1 F (36.7 C)   Ht 6' (1.829 m)   Wt 198 lb (89.8 kg)   SpO2 98%   BMI 26.85 kg/m   GEN: WDWN, NAD, Non-toxic, A & O x 3 HEENT: Atraumatic, Normocephalic. Neck supple. No masses, No LAD. Ears and Nose: No external deformity. EXTR: No c/c/e NEURO Normal gait.  PSYCH: Normally interactive. Conversant. Not depressed or anxious appearing.  Calm demeanor.   GU - normal male, L testicle TTP more than R in posterior.  Laboratory and Imaging Data:  Assessment and Plan:   Scrotal pain - Plan: US Scrotum, US Art/Ven Flow Abd Pelv Doppler  More likely epididymitis, but eval further given persistent symptoms, eval for possible testicular mass.   Rx abx in case normal  [place]  Follow-up: No Follow-up on file.  Meds ordered this encounter  Medications  . levofloxacin (LEVAQUIN) 500 MG tablet    Sig: Take 1 tablet (500 mg total) by mouth daily.    Dispense:  10 tablet    Refill:  0   Medications Discontinued During This Encounter  Medication Reason  . HYDROcodone-homatropine (HYCODAN) 5-1.5 MG/5ML syrup Error  . methocarbamol (ROBAXIN) 500 MG tablet Error  . methylPREDNISolone (MEDROL DOSEPAK) 4 MG TBPK tablet Error  . brompheniramine-pseudoephedrine-DM 30-2-10 MG/5ML syrup Error   Orders Placed This Encounter  Procedures  . US Scrotum  . [place] Art/Ven Flow Abd Pelv Doppler    Signed,  Alexandr Oehler T. Billie Trager, MD   Allergies as of 11/09/2016      Reactions   Oxycodone-acetaminophen     REACTION: extemely dizzy with nausea and vomiting.      Medication List       Accurate as of 11/09/16  9:47 AM. Always use your most recent med list.          esomeprazole 20 MG capsule Commonly known as:  NEXIUM TAKE ONE CAPSULE BY MOUTH DAILY   ibuprofen 600 MG tablet Commonly known as:  ADVIL,MOTRIN Take 1 tablet (600 mg total) by mouth every 8 (eight) hours as needed.   levofloxacin 500 MG tablet Commonly known as:  LEVAQUIN Take 1 tablet (500 mg total) by mouth daily.   montelukast 10 MG tablet Commonly known as:  SINGULAIR TAKE ONE TABLET BY MOUTH AT BEDTIME   multivitamin tablet Take 1 tablet by mouth daily.   PROAIR HFA 108 (90 Base) MCG/ACT inhaler Generic drug:  albuterol INHALE 2 PUFFS INTO THE LUNGS EVERY 6 HOURS AS NEEDED FOR COUGH   SYMBICORT 160-4.5 MCG/ACT inhaler Generic drug:  budesonide-formoterol INHALE ONE PUFF INTO THE LUNGS TWICE A DAY. RINSE, GARGE, SPIT AFTER USE.   tiZANidine 4 MG tablet Commonly known as:  ZANAFLEX Take 1 tablet (4 mg total) by mouth 2 (two) times daily as needed for muscle spasms.

## 2016-11-09 NOTE — Patient Instructions (Signed)

## 2016-11-18 ENCOUNTER — Encounter: Payer: Self-pay | Admitting: Pulmonary Disease

## 2016-12-02 DIAGNOSIS — G4733 Obstructive sleep apnea (adult) (pediatric): Secondary | ICD-10-CM | POA: Diagnosis not present

## 2017-01-06 ENCOUNTER — Ambulatory Visit (INDEPENDENT_AMBULATORY_CARE_PROVIDER_SITE_OTHER): Payer: BLUE CROSS/BLUE SHIELD | Admitting: Pulmonary Disease

## 2017-01-06 ENCOUNTER — Encounter: Payer: Self-pay | Admitting: Pulmonary Disease

## 2017-01-06 VITALS — Resp 16 | Ht 72.0 in | Wt 181.0 lb

## 2017-01-06 DIAGNOSIS — J301 Allergic rhinitis due to pollen: Secondary | ICD-10-CM | POA: Diagnosis not present

## 2017-01-06 DIAGNOSIS — G4733 Obstructive sleep apnea (adult) (pediatric): Secondary | ICD-10-CM

## 2017-01-06 DIAGNOSIS — J4540 Moderate persistent asthma, uncomplicated: Secondary | ICD-10-CM

## 2017-01-06 DIAGNOSIS — R0683 Snoring: Secondary | ICD-10-CM | POA: Diagnosis not present

## 2017-01-06 NOTE — Progress Notes (Signed)
PROBLEMS: Moderate persistent asthma OSA Moderate snoring volume.   DATA: Sleep study 02/02/16: AHI 11.8/hr with frequent arousals and sleep fragmentation. Spirometry 06/14/13: FEV1 3.85 liters (85% pred), FEV1/FVC 79% Spirometry 02/26/16:  FEV1 3.81 liters (84% pred), FEV1/FVC 81% CPAP compliance 11/05-12/04/17: Set pressure 9 cm H2O. Usage 20/30 days. 17 days > 4 hrs.  CPAP compliance : Set pressure 9 cm H2O. Usage 20/30 days. 17 days > 4 hrs  INTERVAL HISTORY: No major events  SUBJ: Routine ROV for F/U of asthma and OSA. Has stopped using CPAP due to mask leak which was disturbing his wife's sleep. Also, he believes that the CPAP was causing sore throat and the mask developed a foul odor. He does not feel much different - minimal daytime hypersomnolence. He is not sure if he is snoring but his wife is not complaining if he is.  Asthma is less well-controlled - attributed to pollens. Using albuterol rescue MDI 1-2 X per day.. Denies CP, fever, purulent sputum, hemoptysis, LE edema and calf tenderness  OBJ: Vitals:   01/06/17 0906  Resp: 16  Weight: 181 lb (82.1 kg)  Height: 6' (1.829 m)  RA  Gen: WDWN in NAD HEENT: NCAT, sclerae white, oropharynx normal, nares patent, nasal mucosa mildly erythematous Neck: NO LAN, no JVD noted Lungs: full BS, no wheezes or other adventitious sounds Cardiovascular: RRR, no M noted Abdomen: Soft, NT, +BS Ext: no C/C/E Neuro: PERRL, EOMI, motor/sensory grossly intact Skin: No lesions noted    Current Outpatient Prescriptions on File Prior to Visit  Medication Sig Dispense Refill  . esomeprazole (NEXIUM) 20 MG capsule TAKE ONE CAPSULE BY MOUTH DAILY 30 capsule 11  . ibuprofen (ADVIL,MOTRIN) 600 MG tablet Take 1 tablet (600 mg total) by mouth every 8 (eight) hours as needed. 15 tablet 0  . montelukast (SINGULAIR) 10 MG tablet TAKE ONE TABLET BY MOUTH AT BEDTIME 30 tablet 11  . Multiple Vitamin (MULTIVITAMIN) tablet Take 1 tablet by mouth daily.     Marland Kitchen. PROAIR HFA 108 (90 Base) MCG/ACT inhaler INHALE 2 PUFFS INTO THE LUNGS EVERY 6 HOURS AS NEEDED FOR COUGH 8.5 g 2  . SYMBICORT 160-4.5 MCG/ACT inhaler INHALE ONE PUFF INTO THE LUNGS TWICE A DAY. RINSE, GARGE, SPIT AFTER USE. 10.2 g 5  . tiZANidine (ZANAFLEX) 4 MG tablet Take 1 tablet (4 mg total) by mouth 2 (two) times daily as needed for muscle spasms. 60 tablet 1   No current facility-administered medications on file prior to visit.      DATA: BMP Latest Ref Rng & Units 03/18/2008 03/13/2008  Glucose 70 - 99 mg/dL 94 161(W126(H)  BUN 6 - 23 mg/dL 17 10  Creatinine 0.4 - 1.5 mg/dL 1.2 1.0  Sodium 960135 - 454145 meq/L 140 140  Potassium 3.5 - 5.1 meq/L 4.1 3.2(L)  Chloride 96 - 112 meq/L 103 102  CO2 19 - 32 meq/L 30 29  Calcium 8.4 - 10.5 mg/dL 9.7 9.3   No flowsheet data found.  CXR: NNF  IMPRESSION: Moderate persistent asthma - control adequate but mildly worsened due to heavy pollens presently OSA - mild. CPAP intolerance. We dicussed this. Mild sleep apnea does not necessarily require treatment as long as he is asymptomatic. We discussed other options like weight mgmt (he is not really overweight at all) and sleep positioning.  Seasonal allergic rhinitis due to pollens  PLAN: Continue Symbicort, Singulair, Claritin, Flonase, albuterol as needed We will contact company to pick up CPAP machine We discussed strategies to minimize  snoring including weight management, avoidance of alcohol and sedatives prior to sleep, sleep positioning. Follow-up in 4-6 months  Antonio Fischer, MD PCCM service Mobile [phone] Pager [phone] 01/06/2017 9:12 AM

## 2017-01-06 NOTE — Patient Instructions (Signed)
Continue Symbicort, Singulair, Claritin, Flonase, albuterol as needed We will contact company to pick up CPAP machine We discussed strategies to minimize snoring including weight management, avoidance of alcohol and sedatives prior to sleep, sleep positioning. Follow-up in 4-6 months

## 2017-01-09 ENCOUNTER — Encounter: Payer: Self-pay | Admitting: Pulmonary Disease

## 2017-03-14 DIAGNOSIS — M531 Cervicobrachial syndrome: Secondary | ICD-10-CM | POA: Diagnosis not present

## 2017-03-14 DIAGNOSIS — M9902 Segmental and somatic dysfunction of thoracic region: Secondary | ICD-10-CM | POA: Diagnosis not present

## 2017-03-14 DIAGNOSIS — M9901 Segmental and somatic dysfunction of cervical region: Secondary | ICD-10-CM | POA: Diagnosis not present

## 2017-04-03 ENCOUNTER — Encounter: Payer: Self-pay | Admitting: Family Medicine

## 2017-04-03 ENCOUNTER — Ambulatory Visit (INDEPENDENT_AMBULATORY_CARE_PROVIDER_SITE_OTHER): Payer: PRIVATE HEALTH INSURANCE | Admitting: Family Medicine

## 2017-04-03 VITALS — BP 122/80 | HR 86 | Temp 98.5°F | Resp 18 | Ht 72.0 in | Wt 181.0 lb

## 2017-04-03 DIAGNOSIS — M25512 Pain in left shoulder: Secondary | ICD-10-CM

## 2017-04-03 DIAGNOSIS — S46212A Strain of muscle, fascia and tendon of other parts of biceps, left arm, initial encounter: Secondary | ICD-10-CM

## 2017-04-03 NOTE — Progress Notes (Signed)
Dr. Karleen HampshireSpencer T. Jersie Beel, MD, CAQ Sports Medicine Primary Care and Sports Medicine [address] [place], [address] Phone: [phone] Fax: [phone]  04/03/2017  Patient: Antonio Lara, MRN: [id number], DOB: 10/09/1975, 41 y.o.  Primary Physician:  Hannah Beatopland, Averianna Brugger, MD   Chief Complaint  Patient presents with  . Acute Visit     Sharp Left shoulder & elbow pain   Subjective:   Antonio Lara is a 41 y.o. very pleasant male patient who presents with the following:  L biceps: Pleasant patient well known who presents today with acute left-sided upper extremity injury that he describes occurred in the workplace at [employer]. He reports that he was lifting a 50 inch TV and then sustained anterior upper extremity pain.  This was significantly worse yesterday, but persists now. He is having a worse pain in the shoulder and in the upper anterior chest. He is having some decreased functionality in the plane of abduction as well as flexion of the bicep.  Past Medical History, Surgical History, Social History, Family History, Problem List, Medications, and Allergies have been reviewed and updated if relevant.  Patient Active Problem List   Diagnosis Date Noted  . GERD 07/10/2009  . BACK PAIN, LUMBAR 06/10/2008  . Allergic rhinitis 04/08/2008    Past Medical History:  Diagnosis Date  . Asthma, exercise induced         ?  . Back pain, chronic    Dr. Annell GreeningMark Yates  . GERD (gastroesophageal reflux disease)   . Mononucleosis    in high school    No past surgical history on file.  Social History   Social History  . Marital status: Single    Spouse name: N/A  . Number of children: 2  . Years of education: N/A   Occupational History  . Sales Time [employer]   Social History Main Topics  . Smoking status: Never Smoker  . Smokeless tobacco: Never Used  . Alcohol use Yes     Comment: rarely  . Drug use: No  . Sexual activity: Not on file   Other Topics  Concern  . Not on file   Social History Narrative   Drinks one caffeinated beverage a day.    Family History  Problem Relation Age of Onset  . Breast cancer Mother   . Stroke Father   . Cancer Maternal Grandfather     Allergies  Allergen Reactions  . Oxycodone-Acetaminophen     REACTION: extemely dizzy with nausea and vomiting.    Medication list reviewed and updated in full in [facility] Link.  GEN: No fevers, chills. Nontoxic. Primarily MSK c/o today. MSK: Detailed in the HPI GI: tolerating PO intake without difficulty Neuro: No numbness, parasthesias, or tingling associated. Otherwise the pertinent positives of the ROS are noted above.   Objective:   BP 122/80   Pulse 86   Temp 98.5 F (36.9 C)   Resp 18   Ht 6' (1.829 m)   Wt 181 lb (82.1 kg)   SpO2 99%   BMI 24.55 kg/m    GEN: WDWN, NAD, Non-toxic, Alert & Oriented x 3 HEENT: Atraumatic, Normocephalic.  Ears and Nose: No external deformity. EXTR: No clubbing/cyanosis/edema NEURO: Normal gait.  PSYCH: Normally interactive. Conversant. Not depressed or anxious appearing.  Calm demeanor.   Shoulder: L Inspection: No muscle wasting or winging. No biceps defect. Ecchymosis/edema: neg  AC joint, scapula, clavicle: NT Cervical spine: NT, full ROM Spurling's: neg Abduction:  full, 5/5, some pain Flexion: full, 5/5 IR, full, lift-off: 5/5 ER at neutral: full, 5/5  Flexion at the elbow 4+/5  AC crossover and compression: neg Neer: neg Hawkins: neg Drop Test: neg Empty Can: neg Supraspinatus insertion: NT Bicipital groove: NT Speed's: + Yergason's: + Sulcus sign: neg Scapular dyskinesis: none C5-T1 intact Sensation intact Grip 5/5   Radiology: No results found.  Assessment and Plan:   Strain of left biceps, initial encounter  Acute pain of left shoulder  >25 minutes spent in face to face time with patient, >50% spent in counselling or coordination of care   Almost certainly bicep  muscular injury versus biceps tendon injury. No defect is present. Strength is relatively well preserved.  I recommended that he report this to his employer and follow-up with their workers comp provider. In the event that that process is not carried forward, I recommended that he follow up with me in 3 weeks.  For now, limitation of maximal lifting of 5 pounds.  I gave him a range of motion protocol for the elbow and shoulder.  Follow-up: Return in about 3 weeks (around 04/24/2017).  Signed,  Elpidio Galea. Eloise Mula, MD   Allergies as of 04/03/2017      Reactions   Oxycodone-acetaminophen    REACTION: extemely dizzy with nausea and vomiting.      Medication List       Accurate as of 04/03/17  1:56 PM. Always use your most recent med list.          esomeprazole 20 MG capsule Commonly known as:  NEXIUM TAKE ONE CAPSULE BY MOUTH DAILY   ibuprofen 600 MG tablet Commonly known as:  ADVIL,MOTRIN Take 1 tablet (600 mg total) by mouth every 8 (eight) hours as needed.   montelukast 10 MG tablet Commonly known as:  SINGULAIR TAKE ONE TABLET BY MOUTH AT BEDTIME   multivitamin tablet Take 1 tablet by mouth daily.   PROAIR HFA 108 (90 Base) MCG/ACT inhaler Generic drug:  albuterol INHALE 2 PUFFS INTO THE LUNGS EVERY 6 HOURS AS NEEDED FOR COUGH   SYMBICORT 160-4.5 MCG/ACT inhaler Generic drug:  budesonide-formoterol INHALE ONE PUFF INTO THE LUNGS TWICE A DAY. RINSE, GARGE, SPIT AFTER USE.   tiZANidine 4 MG tablet Commonly known as:  ZANAFLEX Take 1 tablet (4 mg total) by mouth 2 (two) times daily as needed for muscle spasms.

## 2017-04-19 ENCOUNTER — Ambulatory Visit (INDEPENDENT_AMBULATORY_CARE_PROVIDER_SITE_OTHER): Payer: BLUE CROSS/BLUE SHIELD | Admitting: Family Medicine

## 2017-04-19 ENCOUNTER — Encounter: Payer: Self-pay | Admitting: Family Medicine

## 2017-04-19 VITALS — BP 106/74 | HR 83 | Temp 98.5°F | Ht 72.0 in | Wt 180.5 lb

## 2017-04-19 DIAGNOSIS — S46212D Strain of muscle, fascia and tendon of other parts of biceps, left arm, subsequent encounter: Secondary | ICD-10-CM

## 2017-04-19 DIAGNOSIS — M7582 Other shoulder lesions, left shoulder: Secondary | ICD-10-CM

## 2017-04-19 DIAGNOSIS — M5412 Radiculopathy, cervical region: Secondary | ICD-10-CM

## 2017-04-19 DIAGNOSIS — M7542 Impingement syndrome of left shoulder: Secondary | ICD-10-CM | POA: Diagnosis not present

## 2017-04-19 MED ORDER — PREDNISONE 20 MG PO TABS: ORAL_TABLET | ORAL | 0 refills | Status: DC

## 2017-04-19 MED ORDER — LORATADINE-PSEUDOEPHEDRINE ER 10-240 MG PO TB24: 1.0000 | ORAL_TABLET | Freq: Every day | ORAL | 1 refills | Status: DC

## 2017-04-19 NOTE — Progress Notes (Signed)
Dr. Karleen HampshireSpencer T. Norvell Caswell, MD, CAQ Sports Medicine Primary Care and Sports Medicine [address] [place], [address] Phone: [phone] Fax: [phone]  04/19/2017  Patient: Antonio Lara, MRN: [id number], DOB: Jul 08, 1976, 41 y.o.  Primary Physician:  Hannah Beatopland, Tanae Petrosky, MD   Chief Complaint  Patient presents with  . Follow-up    Torn Left Bicep   Subjective:   Antonio Lara is a 41 y.o. very pleasant male patient who presents with the following:  Initially seen 04/03/2017. Felt to be L sided grade 1 bicep strain clinically then and now here in f/u. His biceps strength and function has improved throughout this time, and he never developed any muscular defect, or significant bruising.  At this point, his primary complaint is pain in the plane of abduction as well as internal range of motion.  He has had to alter his work functionality and decrease the amount of weight that he is lifting at work. No prior history of significant shoulder trauma or operative intervention in the affected shoulder.  Pain and burningn through the abduction and "feels weak."  Some radicular symptoms down the R and L arm. Patient's history is significant for prior cervical radiculopathy, and he has had both back and neck problems going back several years per recollection and review of the medical record.  Past Medical History, Surgical History, Social History, Family History, Problem List, Medications, and Allergies have been reviewed and updated if relevant.  Patient Active Problem List   Diagnosis Date Noted  . GERD 07/10/2009  . BACK PAIN, LUMBAR 06/10/2008  . Allergic rhinitis 04/08/2008    Past Medical History:  Diagnosis Date  . Asthma, exercise induced         ?  . Back pain, chronic    Dr. Annell GreeningMark Yates  . GERD (gastroesophageal reflux disease)   . Mononucleosis    in high school    No past surgical history on file.  Social History   Social History  . Marital status:  Single    Spouse name: N/A  . Number of children: 2  . Years of education: N/A   Occupational History  . Sales Time [employer]   Social History Main Topics  . Smoking status: Never Smoker  . Smokeless tobacco: Never Used  . Alcohol use Yes     Comment: rarely  . Drug use: No  . Sexual activity: Not on file   Other Topics Concern  . Not on file   Social History Narrative   Drinks one caffeinated beverage a day.    Family History  Problem Relation Age of Onset  . Breast cancer Mother   . Stroke Father   . Cancer Maternal Grandfather     Allergies  Allergen Reactions  . Oxycodone-Acetaminophen     REACTION: extemely dizzy with nausea and vomiting.    Medication list reviewed and updated in full in [facility] Link.  GEN: No fevers, chills. Nontoxic. Primarily MSK c/o today. MSK: Detailed in the HPI GI: tolerating PO intake without difficulty Neuro: as above Otherwise the pertinent positives of the ROS are noted above.   Objective:   BP 106/74   Pulse 83   Temp 98.5 F (36.9 C) (Oral)   Ht 6' (1.829 m)   Wt 180 lb 8 oz (81.9 kg)   BMI 24.48 kg/m    GEN: Well-developed,well-nourished,in no acute distress; alert,appropriate and cooperative throughout examination HEENT: Normocephalic and atraumatic without obvious abnormalities. Ears, externally no deformities  PULM: Breathing comfortably in no respiratory distress EXT: No clubbing, cyanosis, or edema PSYCH: Normally interactive. Cooperative during the interview. Pleasant. Friendly and conversant. Not anxious or depressed appearing. Normal, full affect.  Shoulder: L Inspection: No muscle wasting or winging Ecchymosis/edema: neg  AC joint, scapula, clavicle: NT Cervical spine: NT, full ROM Spurling's: neg Abduction: full, 5/5, painful arc of motion Flexion: full, 5/5 IR, full, lift-off: 5/5 ER at neutral: full, 5/5 AC crossover: neg Neer: pos Hawkins: pos Drop Test: neg Empty Can: mild  pos Supraspinatus insertion: mild-mod T Bicipital groove: NT Speed's: minimally positive Yergason's: neg Sulcus sign: neg Scapular dyskinesis: none C5-T1 intact  Neuro: Sensation intact Grip 5/5   Str at elbow 5/5 flexion and extension with no pain  CERVICAL SPINE EXAM Range of motion: Flexion, extension, lateral bending, and rotation: there is minor loss of motion throughout, less than 10% in all directions. Pain with terminal motion: y Spinous Processes: NT SCM: NT Upper paracervical muscles: mild ttp Upper traps: NT C5-T1 intact, sensation and motor   Radiology: No results found.  Assessment and Plan:   Strain of left biceps, subsequent encounter - Plan: Ambulatory referral to Physical Therapy  Shoulder impingement, left - Plan: Ambulatory referral to Physical Therapy  Rotator cuff tendonitis, left - Plan: Ambulatory referral to Physical Therapy  Cervical radiculopathy - Plan: Ambulatory referral to Physical Therapy  Likely initiating event is the biceps injury, and after altered mechanics, the patient has inflamed is rotator cuff and developed some impingement.  His strength is very good.  Formal physical therapy to address scapular mechanics and rotator cuff stability of utmost importance.  I will also have the therapist work on some cervical spine techniques.  Overall, the biceps strain appears much better compared to when I saw him 2 weeks ago.  Oral prednisone to hopefully combat pain and inflammation in multiple areas in the upper extremities and neck.  Follow-up: Return in about 6 weeks (around 05/31/2017).  Future Appointments Date Time Provider Department Center  05/31/2017 9:30 AM Thorne Wirz, Karleen Hampshire, MD [facility]    Meds ordered this encounter  Medications  . loratadine-pseudoephedrine (CLARITIN-D 24-HOUR) 10-240 MG 24 hr tablet    Sig: Take 1 tablet by mouth daily.    Dispense:  90 tablet    Refill:  1  . predniSONE (DELTASONE) 20 MG  tablet    Sig: 2 tabs po for 5 days, then 1 tab po for 5 days    Dispense:  15 tablet    Refill:  0   Medications Discontinued During This Encounter  Medication Reason  . ibuprofen (ADVIL,MOTRIN) 600 MG tablet One time medication   Orders Placed This Encounter  Procedures  . Ambulatory referral to Physical Therapy    Signed,  Karleen Hampshire T. Melynda Krzywicki, MD   Patient's Medications  New Prescriptions   LORATADINE-PSEUDOEPHEDRINE (CLARITIN-D 24-HOUR) 10-240 MG 24 HR TABLET    Take 1 tablet by mouth daily.   PREDNISONE (DELTASONE) 20 MG TABLET    2 tabs po for 5 days, then 1 tab po for 5 days  Previous Medications   ESOMEPRAZOLE (NEXIUM) 20 MG CAPSULE    TAKE ONE CAPSULE BY MOUTH DAILY   MONTELUKAST (SINGULAIR) 10 MG TABLET    TAKE ONE TABLET BY MOUTH AT BEDTIME   MULTIPLE VITAMIN (MULTIVITAMIN) TABLET    Take 1 tablet by mouth daily.   PROAIR HFA 108 (90 BASE) MCG/ACT INHALER    INHALE 2 PUFFS INTO THE LUNGS EVERY 6 HOURS AS NEEDED  FOR COUGH   SYMBICORT 160-4.5 MCG/ACT INHALER    INHALE ONE PUFF INTO THE LUNGS TWICE A DAY. RINSE, GARGE, SPIT AFTER USE.   TIZANIDINE (ZANAFLEX) 4 MG TABLET    Take 1 tablet (4 mg total) by mouth 2 (two) times daily as needed for muscle spasms.  Modified Medications   No medications on file  Discontinued Medications   IBUPROFEN (ADVIL,MOTRIN) 600 MG TABLET    Take 1 tablet (600 mg total) by mouth every 8 (eight) hours as needed.

## 2017-05-08 ENCOUNTER — Telehealth: Payer: Self-pay | Admitting: Family Medicine

## 2017-05-08 NOTE — Telephone Encounter (Signed)
Cala Bradford at [facility] called and requested that when patient is seen on 05/31/17 the office note be faxed to [phone].

## 2017-05-08 NOTE — Telephone Encounter (Signed)
Noted  

## 2017-05-31 ENCOUNTER — Encounter: Payer: Self-pay | Admitting: Family Medicine

## 2017-05-31 ENCOUNTER — Ambulatory Visit (INDEPENDENT_AMBULATORY_CARE_PROVIDER_SITE_OTHER): Payer: Worker's Compensation | Admitting: Family Medicine

## 2017-05-31 VITALS — BP 100/70 | HR 84 | Temp 98.3°F | Ht 72.0 in | Wt 180.2 lb

## 2017-05-31 DIAGNOSIS — M7582 Other shoulder lesions, left shoulder: Secondary | ICD-10-CM

## 2017-05-31 DIAGNOSIS — S46212D Strain of muscle, fascia and tendon of other parts of biceps, left arm, subsequent encounter: Secondary | ICD-10-CM | POA: Diagnosis not present

## 2017-05-31 DIAGNOSIS — M5412 Radiculopathy, cervical region: Secondary | ICD-10-CM | POA: Diagnosis not present

## 2017-05-31 DIAGNOSIS — M7542 Impingement syndrome of left shoulder: Secondary | ICD-10-CM

## 2017-05-31 NOTE — Progress Notes (Signed)
Dr. Karleen Hampshire T. Asah Lamay, MD, CAQ Sports Medicine Primary Care and Sports Medicine [address] [place], [address] Phone: [phone] Fax: [phone]  05/31/2017  Patient: Antonio Lara, MRN: [id number], DOB: 12-Sep-1975, 41 y.o.  Primary Physician:  Hannah Beat, MD   Chief Complaint  Patient presents with  . Follow-up    Left Torn Bicep   Subjective:   Antonio Lara is a 41 y.o. very pleasant male patient who presents with the following:  Neck and shoulder rehab has helped a lot. I have reviewed the notes for PT, and compared to his last office visit patient is doing quite a bit better.  His range of motion is better in the shoulder.  He has no biceps strength deficit or pain.  His movement is dramatically better.  He still does continue to have some mild terminal motion pain in the neck.  This is notable with more lateral movements and rotational movements.  Of note, the patient has had some neck pain in the past prior to this incident.  Rare radicular symptoms and he has had that previously also.  A little bit of tingling - both arms.   05/08/2017 Last OV with Hannah Beat, MD  Initially seen 04/03/2017. Felt to be L sided grade 1 bicep strain clinically then and now here in f/u. His biceps strength and function has improved throughout this time, and he never developed any muscular defect, or significant bruising.  At this point, his primary complaint is pain in the plane of abduction as well as internal range of motion.  He has had to alter his work functionality and decrease the amount of weight that he is lifting at work. No prior history of significant shoulder trauma or operative intervention in the affected shoulder.  Pain and burningn through the abduction and "feels weak."  Some radicular symptoms down the R and L arm. Patient's history is significant for prior cervical radiculopathy, and he has had both back and neck problems going back several  years per recollection and review of the medical record.  Past Medical History, Surgical History, Social History, Family History, Problem List, Medications, and Allergies have been reviewed and updated if relevant.  Patient Active Problem List   Diagnosis Date Noted  . GERD 07/10/2009  . BACK PAIN, LUMBAR 06/10/2008  . Allergic rhinitis 04/08/2008    Past Medical History:  Diagnosis Date  . Asthma, exercise induced         ?  . Back pain, chronic    Dr. Annell Greening  . GERD (gastroesophageal reflux disease)   . Mononucleosis    in high school    No past surgical history on file.  Social History   Social History  . Marital status: Single    Spouse name: N/A  . Number of children: 2  . Years of education: N/A   Occupational History  . Sales Time [employer]   Social History Main Topics  . Smoking status: Never Smoker  . Smokeless tobacco: Never Used  . Alcohol use Yes     Comment: rarely  . Drug use: No  . Sexual activity: Not on file   Other Topics Concern  . Not on file   Social History Narrative   Drinks one caffeinated beverage a day.    Family History  Problem Relation Age of Onset  . Breast cancer Mother   . Stroke Father   . Cancer Maternal Grandfather     Allergies  Allergen Reactions  . Oxycodone-Acetaminophen     REACTION: extemely dizzy with nausea and vomiting.    Medication list reviewed and updated in full in [facility].  GEN: No fevers, chills. Nontoxic. Primarily MSK c/o today. MSK: Detailed in the HPI GI: tolerating PO intake without difficulty Neuro: as above Otherwise the pertinent positives of the ROS are noted above.   Objective:   BP 100/70   Pulse 84   Temp 98.3 F (36.8 C) (Oral)   Ht 6' (1.829 m)   Wt 180 lb 4 oz (81.8 kg)   BMI 24.45 kg/m    GEN: Well-developed,well-nourished,in no acute distress; alert,appropriate and cooperative throughout examination HEENT: Normocephalic and atraumatic without obvious  abnormalities. Ears, externally no deformities PULM: Breathing comfortably in no respiratory distress EXT: No clubbing, cyanosis, or edema PSYCH: Normally interactive. Cooperative during the interview. Pleasant. Friendly and conversant. Not anxious or depressed appearing. Normal, full affect.  Neck asked of very modest terminal motion pain in approximately 5 decreased compared to baseline.  Grossly nontender throughout  Shoulder: L Inspection: No muscle wasting or winging Ecchymosis/edema: neg  AC joint, scapula, clavicle: NT Cervical spine: NT, full ROM Spurling's: neg Abduction: full, 5/5, mild pain Flexion: full, 5/5 IR, full, lift-off: 5/5 ER at neutral: full, 5/5 AC crossover: neg Neer: neg Hawkins: mild pos Drop Test: neg Empty Can: neg Supraspinatus insertion: mild-mod T Bicipital groove: NT Speed's: neg Yergason's: neg Sulcus sign: neg Scapular dyskinesis: none C5-T1 intact  Neuro: Sensation intact Grip 5/5   Assessment and Plan:   Shoulder impingement, left  Rotator cuff tendonitis, left  Cervical radiculopathy  Strain of left biceps, subsequent encounter  Biceps strain, resolved.  Rotator cuff tendinopathy and impingement is improved dramatically since last exam.  Continue with home exercise program and would continue a few more visits of physical therapy.  Cervical radiculopathy is ongoing, range of motion is dramatically improved, it is noteworthy that this has been an issue previously with this patient, but could have been exacerbated by acute injury.  Follow-up: Return in about 6 weeks (around 07/12/2017).  Medications Discontinued During This Encounter  Medication Reason  . predniSONE (DELTASONE) 20 MG tablet Completed Course   No orders of the defined types were placed in this encounter.   Signed,  Elpidio Galea. Elfriede Bonini, MD   Allergies as of 05/31/2017      Reactions   Oxycodone-acetaminophen    REACTION: extemely dizzy with nausea and  vomiting.      Medication List       Accurate as of 05/31/17 11:59 PM. Always use your most recent med list.          esomeprazole 20 MG capsule Commonly known as:  NEXIUM TAKE ONE CAPSULE BY MOUTH DAILY   loratadine-pseudoephedrine 10-240 MG 24 hr tablet Commonly known as:  CLARITIN-D 24-hour Take 1 tablet by mouth daily.   montelukast 10 MG tablet Commonly known as:  SINGULAIR TAKE ONE TABLET BY MOUTH AT BEDTIME   multivitamin tablet Take 1 tablet by mouth daily.   PROAIR HFA 108 (90 Base) MCG/ACT inhaler Generic drug:  albuterol INHALE 2 PUFFS INTO THE LUNGS EVERY 6 HOURS AS NEEDED FOR COUGH   SYMBICORT 160-4.5 MCG/ACT inhaler Generic drug:  budesonide-formoterol INHALE ONE PUFF INTO THE LUNGS TWICE A DAY. RINSE, GARGE, SPIT AFTER USE.   tiZANidine 4 MG tablet Commonly known as:  ZANAFLEX Take 1 tablet (4 mg total) by mouth 2 (two) times daily as needed for muscle spasms.

## 2017-06-01 NOTE — Telephone Encounter (Signed)
Office note from 05/31/2017 faxed to [place] at [facility] [phone].

## 2017-07-03 ENCOUNTER — Encounter: Payer: Self-pay | Admitting: Pulmonary Disease

## 2017-07-03 ENCOUNTER — Ambulatory Visit: Payer: BLUE CROSS/BLUE SHIELD | Admitting: Pulmonary Disease

## 2017-07-03 VITALS — BP 118/82 | HR 80 | Ht 72.0 in | Wt 183.0 lb

## 2017-07-03 DIAGNOSIS — G4733 Obstructive sleep apnea (adult) (pediatric): Secondary | ICD-10-CM

## 2017-07-03 DIAGNOSIS — J4540 Moderate persistent asthma, uncomplicated: Secondary | ICD-10-CM

## 2017-07-03 NOTE — Progress Notes (Signed)
PROBLEMS: Moderate persistent asthma Mild OSA  - intolerant to CPAP   DATA: Sleep study 02/02/16: AHI 11.8/hr with frequent arousals and sleep fragmentation. Spirometry 06/14/13: FEV1 3.85 liters (85% pred), FEV1/FVC 79% Spirometry 02/26/16:  FEV1 3.81 liters (84% pred), FEV1/FVC 81% CPAP compliance 11/05-12/04/17: Set pressure 9 cm H2O. Usage 20/30 days. 17 days > 4 hrs.  CPAP compliance : Set pressure 9 cm H2O. Usage 20/30 days. 17 days > 4 hrs  INTERVAL HISTORY: No major events  SUBJ: Routine ROV for F/U of asthma and OSA.  He continues to snore and sometimes this awakens him.  However, he denies excessive daytime sleepiness.  Overall, his asthma control has been very good.  However, in the past couple of weeks, he has been using his albuterol more frequently - up to once per day.  He attributed this to the recent change in weather.  OBJ: Vitals:   07/03/17 0834 07/03/17 0839  BP:  118/82  Pulse:  80  SpO2:  99%  Weight: 83 kg (183 lb)   Height: 6' (1.829 m)   RA  Gen:  NAD HEENT: NCAT, sclerae white Neck: NO LAN, no JVD noted Lungs: full BS, no wheezes or other adventitious sounds Cardiovascular: Reg, no M  Abdomen: Soft, NT, +BS Ext: no C/C/E Neuro: PERRL, EOMI, motor/sensory grossly intact    Current Outpatient Medications on File Prior to Visit  Medication Sig Dispense Refill  . esomeprazole (NEXIUM) 20 MG capsule TAKE ONE CAPSULE BY MOUTH DAILY 30 capsule 11  . loratadine-pseudoephedrine (CLARITIN-D 24-HOUR) 10-240 MG 24 hr tablet Take 1 tablet by mouth daily. 90 tablet 1  . montelukast (SINGULAIR) 10 MG tablet TAKE ONE TABLET BY MOUTH AT BEDTIME 30 tablet 11  . Multiple Vitamin (MULTIVITAMIN) tablet Take 1 tablet by mouth daily.    Marland Kitchen. PROAIR HFA 108 (90 Base) MCG/ACT inhaler INHALE 2 PUFFS INTO THE LUNGS EVERY 6 HOURS AS NEEDED FOR COUGH 8.5 g 2  . SYMBICORT 160-4.5 MCG/ACT inhaler INHALE ONE PUFF INTO THE LUNGS TWICE A DAY. RINSE, GARGE, SPIT AFTER USE. 10.2 g 5   . tiZANidine (ZANAFLEX) 4 MG tablet Take 1 tablet (4 mg total) by mouth 2 (two) times daily as needed for muscle spasms. 60 tablet 1   No current facility-administered medications on file prior to visit.      DATA: BMP Latest Ref Rng & Units 03/18/2008 03/13/2008  Glucose 70 - 99 mg/dL 94 409(W126(H)  BUN 6 - 23 mg/dL 17 10  Creatinine 0.4 - 1.5 mg/dL 1.2 1.0  Sodium 119135 - 147145 meq/L 140 140  Potassium 3.5 - 5.1 meq/L 4.1 3.2(L)  Chloride 96 - 112 meq/L 103 102  CO2 19 - 32 meq/L 30 29  Calcium 8.4 - 10.5 mg/dL 9.7 9.3   No flowsheet data found.  CXR: NNF  IMPRESSION: Moderate persistent asthma  OSA - mild. CPAP intolerance. We dicussed snoring solutions  PLAN: Continue Symbicort, Singulair, Claritin-D Continue albuterol inhaler as needed We discussed options for snoring management I recommended flu shot this year Follow-up in 6 months or sooner as needed   Antonio Fischeravid Simonds, MD PCCM service Mobile [phone] Pager [phone] 07/03/2017 8:39 AM

## 2017-07-03 NOTE — Patient Instructions (Signed)
Continue Symbicort, Singulair, Claritin-D Continue albuterol inhaler as needed We discussed options for snoring management I recommended flu shot this year Follow-up in 6 months or sooner as needed

## 2017-07-25 ENCOUNTER — Other Ambulatory Visit: Payer: Self-pay | Admitting: Family Medicine

## 2017-07-26 ENCOUNTER — Ambulatory Visit (INDEPENDENT_AMBULATORY_CARE_PROVIDER_SITE_OTHER): Payer: Worker's Compensation | Admitting: Family Medicine

## 2017-07-26 ENCOUNTER — Encounter: Payer: Self-pay | Admitting: Family Medicine

## 2017-07-26 VITALS — BP 110/76 | HR 82 | Temp 98.4°F | Wt 182.0 lb

## 2017-07-26 DIAGNOSIS — M7542 Impingement syndrome of left shoulder: Secondary | ICD-10-CM | POA: Diagnosis not present

## 2017-07-26 DIAGNOSIS — M5412 Radiculopathy, cervical region: Secondary | ICD-10-CM | POA: Diagnosis not present

## 2017-07-26 DIAGNOSIS — S46212D Strain of muscle, fascia and tendon of other parts of biceps, left arm, subsequent encounter: Secondary | ICD-10-CM

## 2017-07-26 NOTE — Progress Notes (Addendum)
Dr. Karleen Hampshire T. Carlis Blanchard, MD, CAQ Sports Medicine Primary Care and Sports Medicine [address] [place], [address] Phone: [phone] Fax: [phone]  07/26/2017  Patient: Antonio Lara, MRN: [id number], DOB: 1976-08-19, 41 y.o.  Primary Physician:  Hannah Beat, MD   Chief Complaint  Patient presents with  . Arm Pain    left bicep tear.... x 2-3 months...   Subjective:   Antonio Lara is a 41 y.o. very pleasant male patient who presents with the following:  F/u L shoulder injury 2-3 months ago.  Patient had as detailed below and in other prior office visits a grade 1 biceps strain, subsequently also developed some rotator cuff tendinopathy in the affected shoulder on the left.  He did well with physical therapy, and he also has had problems with his neck intermittently off and on for multiple years.  Neck symptoms and pain with terminal motion of the neck flared up after injury, and at this point he is doing dramatically better and effectively has minimal to no symptoms at all.  He reports that he was sent for evaluation by workers comp.  Strength has been improving - radicular symptoms.  No numbness or tingling.   05/31/2017 Last OV with Hannah Beat, MD  Neck and shoulder rehab has helped a lot. I have reviewed the notes for PT, and compared to his last office visit patient is doing quite a bit better.  His range of motion is better in the shoulder.  He has no biceps strength deficit or pain.  His movement is dramatically better.  He still does continue to have some mild terminal motion pain in the neck.  This is notable with more lateral movements and rotational movements.  Of note, the patient has had some neck pain in the past prior to this incident.  Rare radicular symptoms and he has had that previously also.  A little bit of tingling - both arms.   05/08/2017 Last OV with Hannah Beat, MD  Initially seen 04/03/2017. Felt to be L sided grade 1  bicep strain clinically then and now here in f/u. His biceps strength and function has improved throughout this time, and he never developed any muscular defect, or significant bruising.  At this point, his primary complaint is pain in the plane of abduction as well as internal range of motion.  He has had to alter his work functionality and decrease the amount of weight that he is lifting at work. No prior history of significant shoulder trauma or operative intervention in the affected shoulder.  Pain and burningn through the abduction and "feels weak."  Some radicular symptoms down the R and L arm. Patient's history is significant for prior cervical radiculopathy, and he has had both back and neck problems going back several years per recollection and review of the medical record.  Past Medical History, Surgical History, Social History, Family History, Problem List, Medications, and Allergies have been reviewed and updated if relevant.  Patient Active Problem List   Diagnosis Date Noted  . GERD 07/10/2009  . BACK PAIN, LUMBAR 06/10/2008  . Allergic rhinitis 04/08/2008    Past Medical History:  Diagnosis Date  . Asthma, exercise induced         ?  . Back pain, chronic    Dr. Annell Greening  . GERD (gastroesophageal reflux disease)   . Mononucleosis    in high school    No past surgical history on file.  Social History  Socioeconomic History  . Marital status: Single    Spouse name: Not on file  . Number of children: 2  . Years of education: Not on file  . Highest education level: Not on file  Social Needs  . Financial resource strain: Not on file  . Food insecurity - worry: Not on file  . Food insecurity - inability: Not on file  . Transportation needs - medical: Not on file  . Transportation needs - non-medical: Not on file  Occupational History  . Occupation: Investment banker, corporateales    Employer: [employer]  Tobacco Use  . Smoking status: Never Smoker  . Smokeless tobacco:  Never Used  Substance and Sexual Activity  . Alcohol use: Yes    Comment: rarely  . Drug use: No  . Sexual activity: Not on file  Other Topics Concern  . Not on file  Social History Narrative   Drinks one caffeinated beverage a day.    Family History  Problem Relation Age of Onset  . Breast cancer Mother   . Stroke Father   . Cancer Maternal Grandfather     Allergies  Allergen Reactions  . Oxycodone-Acetaminophen     REACTION: extemely dizzy with nausea and vomiting.    Medication list reviewed and updated in full in [facility].  GEN: no acute illness or fever CV: No chest pain or shortness of breath MSK: detailed above Neuro: neurological signs are described above ROS O/w per HPI  Objective:   BP 110/76   Pulse 82   Temp 98.4 F (36.9 C) (Oral)   Wt 182 lb (82.6 kg)   SpO2 98%   BMI 24.68 kg/m    GEN: Well-developed,well-nourished,in no acute distress; alert,appropriate and cooperative throughout examination HEENT: Normocephalic and atraumatic without obvious abnormalities. Ears, externally no deformities PULM: Breathing comfortably in no respiratory distress EXT: No clubbing, cyanosis, or edema PSYCH: Normally interactive. Cooperative during the interview. Pleasant. Friendly and conversant. Not anxious or depressed appearing. Normal, full affect.  CERVICAL SPINE EXAM Range of motion: Flexion, extension, lateral bending, and rotation: full Pain with terminal motion: no Spinous Processes: NT SCM: NT Upper paracervical muscles: nt Upper traps: NT C5-T1 intact, sensation and motor   Shoulder: L Inspection: No muscle wasting or winging Ecchymosis/edema: neg  AC joint, scapula, clavicle: NT Cervical spine: NT, full ROM Spurling's: neg Abduction: full, 5/5 Flexion: full, 5/5 IR, full, lift-off: 5/5 ER at neutral: full, 5/5 AC crossover and compression: neg Neer: neg Hawkins: neg Drop Test: neg Empty Can: neg Supraspinatus insertion:  NT Bicipital groove: minimally tender Speed's: neg Yergason's: neg Sulcus sign: neg Scapular dyskinesis: none C5-T1 intact Sensation intact Grip 5/5   Radiology: No results found.  Assessment and Plan:   Strain of left biceps, subsequent encounter  Cervical radiculopathy  Shoulder impingement, left  The patient is dramatically better, and he is improved compared to all examinations that I can recall evaluating his neck or shoulder on prior occasions even prior to injury.  Physical therapy does seem to have helped the patient quite a bit.  Neck range of motion and radicular symptoms are improved.  The patient did have a grade 1 bicep strain, but this never had a defect, and has been resolved for some time.  Impingement and rotator cuff tendinopathy symptoms appear resolved.  Recommend return to full duty without any limitations.  Addendum: 08/24/2017. The patient was seen and evaluated last at the above date on 07/26/2017 and at this point was felt to  have achieved maximum medical improvement.  History is per the medical record, and the patient has a 0 % permanent impairment rating.  Follow-up: prn only  Signed,  Winslow Verrill T. Yazan Gatling, MD   Allergies as of 07/26/2017      Reactions   Oxycodone-acetaminophen    REACTION: extemely dizzy with nausea and vomiting.      Medication List        Accurate as of 07/26/17 11:59 PM. Always use your most recent med list.          esomeprazole 20 MG capsule Commonly known as:  NEXIUM TAKE ONE CAPSULE BY MOUTH DAILY   loratadine-pseudoephedrine 10-240 MG 24 hr tablet Commonly known as:  CLARITIN-D 24-hour Take 1 tablet by mouth daily.   montelukast 10 MG tablet Commonly known as:  SINGULAIR TAKE ONE TABLET BY MOUTH AT BEDTIME   multivitamin tablet Take 1 tablet by mouth daily.   PROAIR HFA 108 (90 Base) MCG/ACT inhaler Generic drug:  albuterol INHALE 2 PUFFS INTO THE LUNGS EVERY 6 HOURS AS NEEDED FOR COUGH    SYMBICORT 160-4.5 MCG/ACT inhaler Generic drug:  budesonide-formoterol INHALE ONE PUFF INTO THE LUNGS TWICE A DAY. RINSE, GARGE, SPIT AFTER USE.   tiZANidine 4 MG tablet Commonly known as:  ZANAFLEX Take 1 tablet (4 mg total) by mouth 2 (two) times daily as needed for muscle spasms.

## 2017-07-29 IMAGING — CT CT MAXILLOFACIAL W/O CM
1 series · 16 of 30 positions shown, 20 images · non-contrast
Comparison: None.

CLINICAL DATA: Facial pain and pressure for at least 5 years.
Drainage in the throat. Dizziness and vertigo.

EXAM:
CT MAXILLOFACIAL WITHOUT CONTRAST
TECHNIQUE: Multidetector CT imaging of the maxillofacial structures was
performed. Multiplanar CT image reconstructions were also generated.
A small metallic BB was placed on the right temple in order to
reliably differentiate right from left.

[Series 3: ax soft · axial · 0.34mm/px · z∈[-156,-44]mm · 16 of 62 slices shown, 20 images]
[im 3/62  brain]
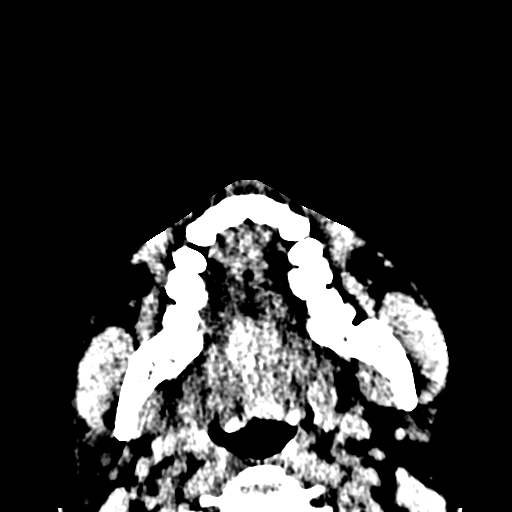
[im 3/62  bone]
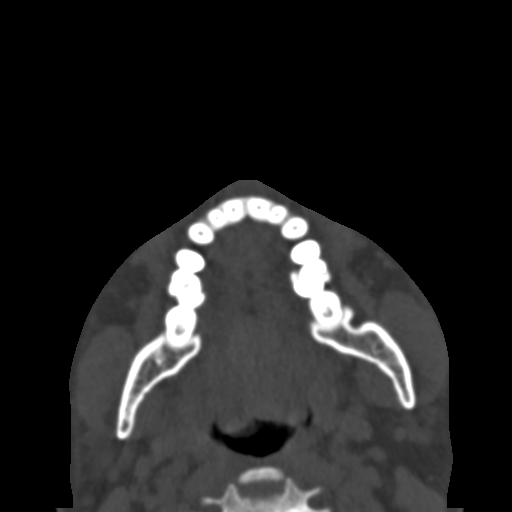
[im 7/62  bone]
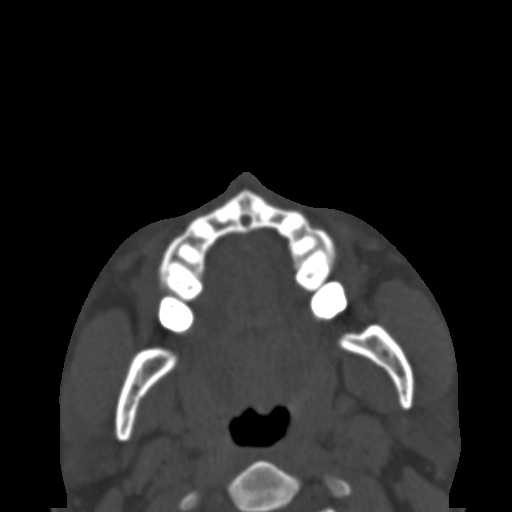
[im 11/62  bone]
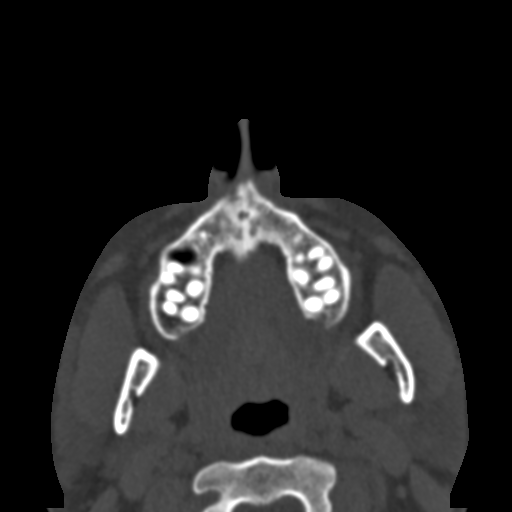
[im 15/62  bone]
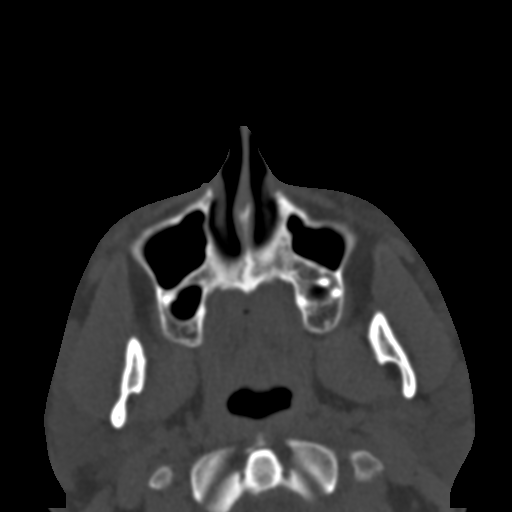
[im 17/62  brain]
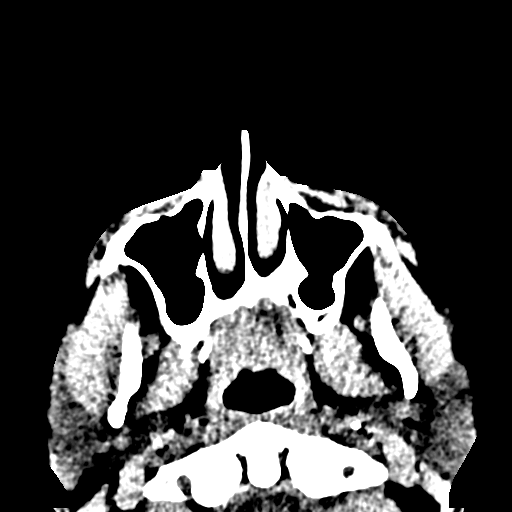
[im 17/62  bone]
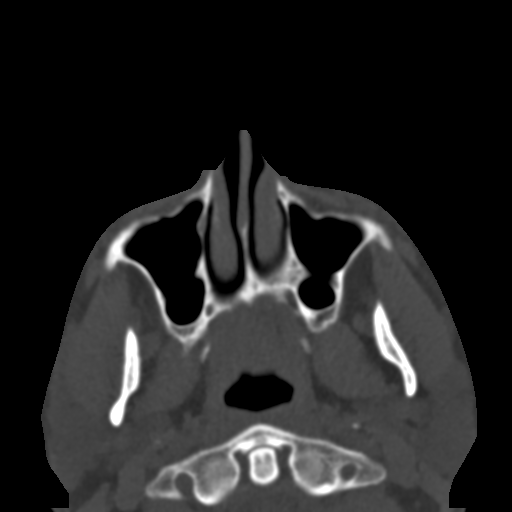
[im 22/62  bone]
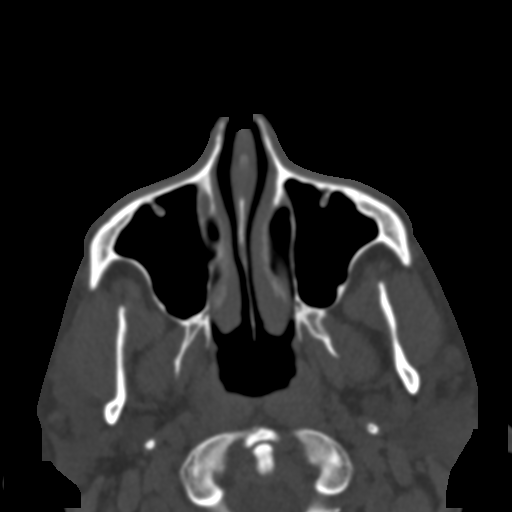
[im 26/62  bone]
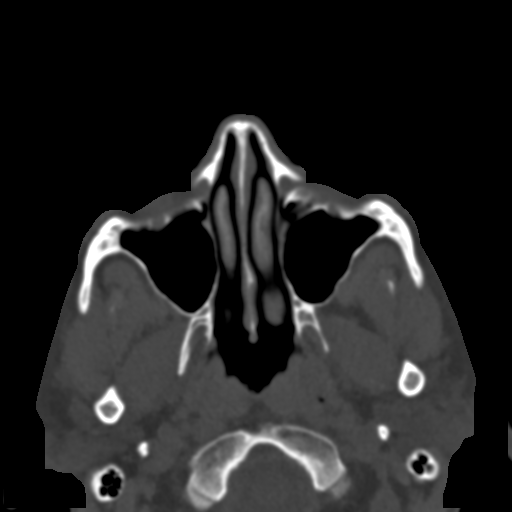
[im 30/62  bone]
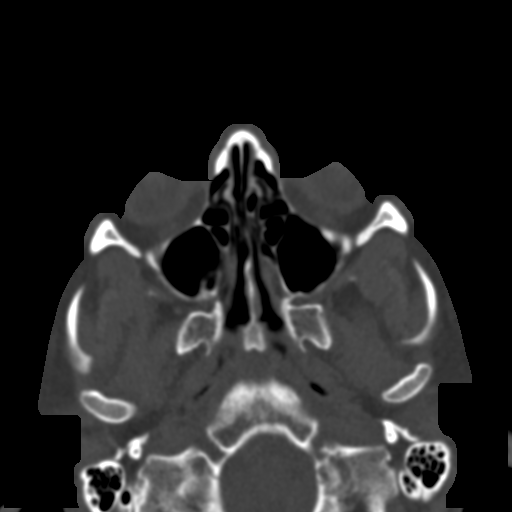
[im 32/62  brain]
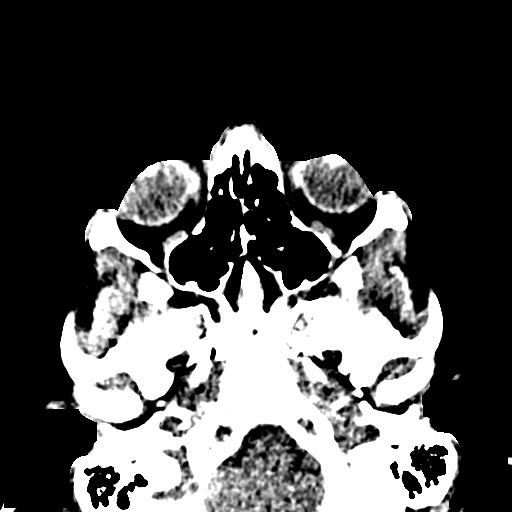
[im 32/62  bone]
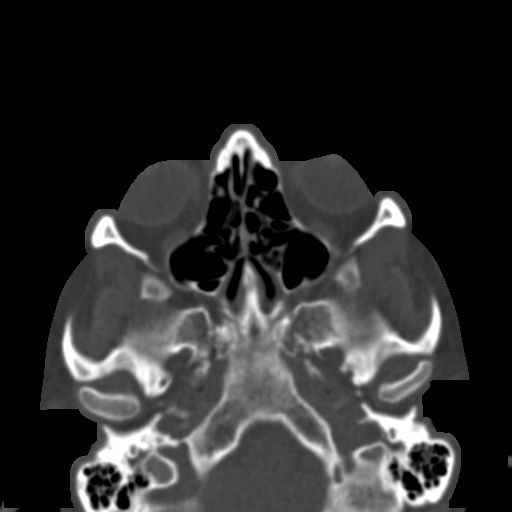
[im 36/62  bone]
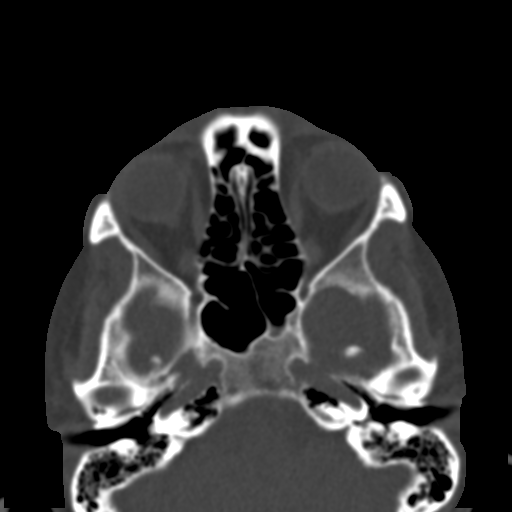
[im 40/62  bone]
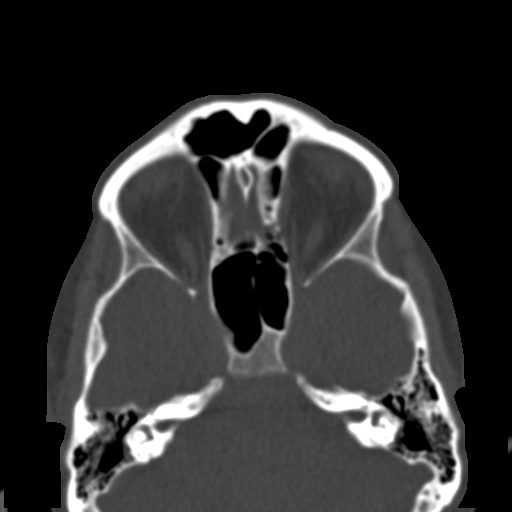
[im 45/62  bone]
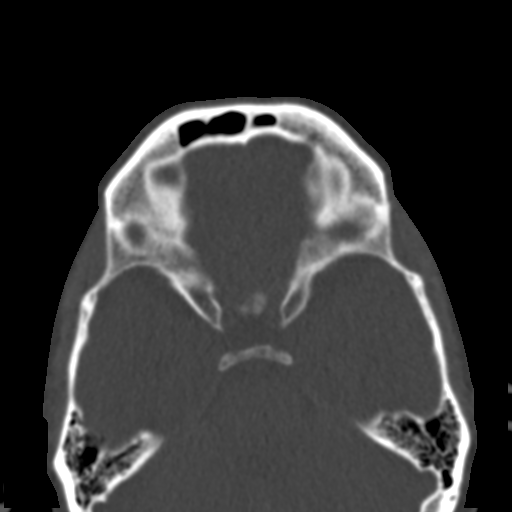
[im 47/62  brain]
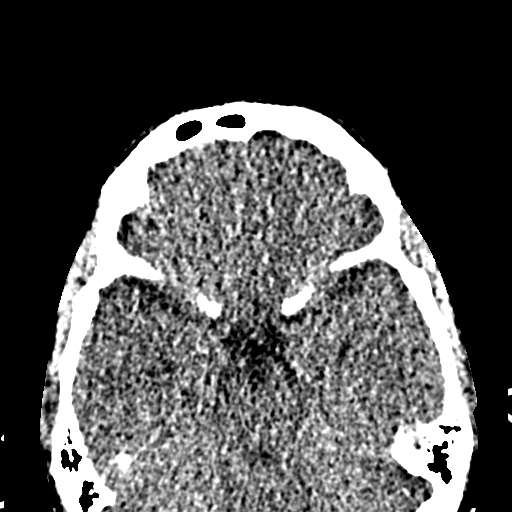
[im 47/62  bone]
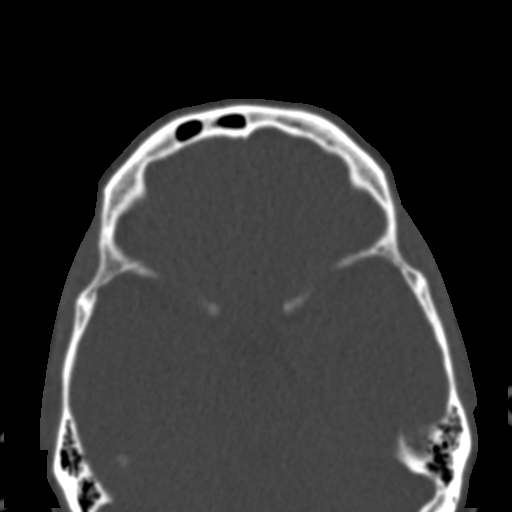
[im 51/62  bone]
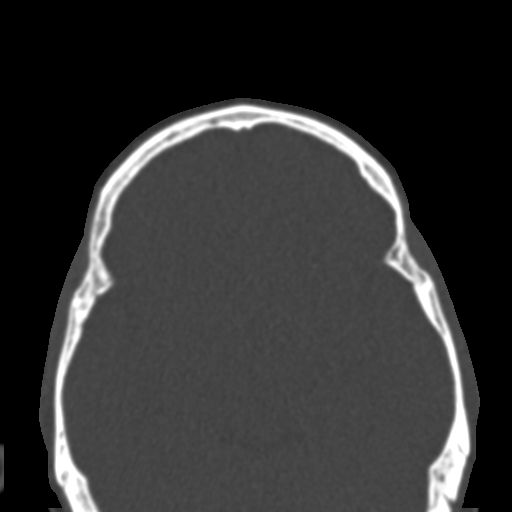
[im 55/62  bone]
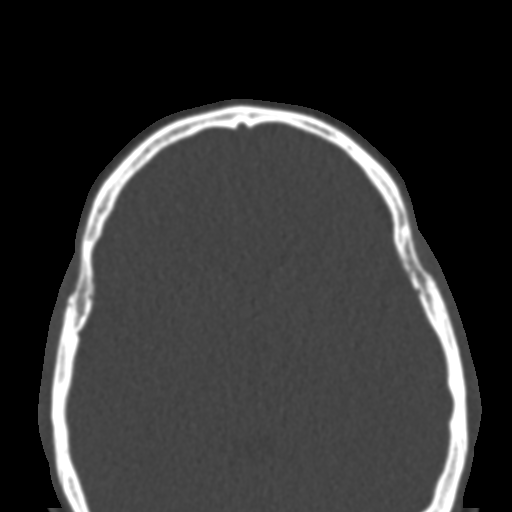
[im 59/62  bone]
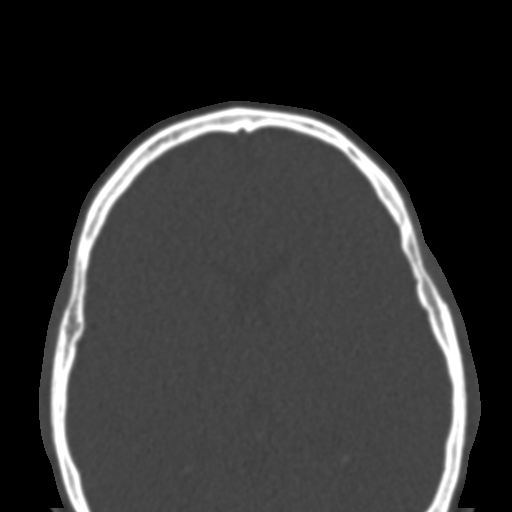

[16 of 30 positions shown; findings below may reference images not displayed]

FINDINGS: Osseous: No fracture or mandibular dislocation. No destructive
process.

Orbits: Negative. No traumatic or inflammatory finding.

Sinuses: Clear.

Soft tissues: Negative.

Limited intracranial: No significant or unexpected finding.
IMPRESSION: Negative exam.

## 2017-08-02 ENCOUNTER — Telehealth: Payer: Self-pay | Admitting: Family Medicine

## 2017-08-02 NOTE — Telephone Encounter (Signed)
Copied from CRM #[id number]. Topic: Quick Communication - See Telephone Encounter >> Aug 02, 2017 10:33 AM Diana EvesHoyt, Maryann B wrote: CRM for notification. See Telephone encounter for:  Noreene LarssonJill with [employer] needing office notes from 12/5 visit. Call back [phone] fax [phone] 08/02/17.

## 2017-08-09 ENCOUNTER — Telehealth: Payer: Self-pay | Admitting: Family Medicine

## 2017-08-09 NOTE — Telephone Encounter (Signed)
Copied from CRM [phone]. Topic: Inquiry >> Aug 09, 2017  4:28 PM Landry MellowFoltz, Melissa J wrote: Reason for CRM: caroline with [employer] called - she needs to have documentation for pt  - she needs a PIR rating as well as MMI date.  Cb # is [phone] fax is [phone]

## 2017-08-09 NOTE — Telephone Encounter (Signed)
Rayfield CitizenCaroline with [employer] calling back because they still have not received office notes from 12/05.

## 2017-08-09 NOTE — Telephone Encounter (Signed)
I faxed 07/26/17 office note to [employer].

## 2017-08-10 NOTE — Telephone Encounter (Signed)
Pt seen 07/26/17.

## 2017-08-24 ENCOUNTER — Telehealth: Payer: Self-pay | Admitting: Family Medicine

## 2017-08-24 NOTE — Telephone Encounter (Signed)
Caroyln  With  [employer]   Called  Needing    PIR   Rating as  Well  As   MMI  Date    See  Telephone  Encounter   08/09/2017    For  Previous    Request     Call back  Number  Is   [phone]    Fax  Number is   [phone]

## 2017-08-24 NOTE — Telephone Encounter (Signed)
PIR abbreviation for Permanent Impairment rating and MMI abbreviation for Maximum medical improvement.Please advise.

## 2017-08-24 NOTE — Telephone Encounter (Signed)
Office note faxed to [employer] at [phone].

## 2017-08-24 NOTE — Telephone Encounter (Signed)
Can you fax my note with the addendum and MMI and PIR to [employer] as asked. Thanks.

## 2017-10-12 ENCOUNTER — Ambulatory Visit: Payer: BLUE CROSS/BLUE SHIELD | Admitting: Family Medicine

## 2017-10-12 ENCOUNTER — Encounter: Payer: Self-pay | Admitting: Family Medicine

## 2017-10-12 ENCOUNTER — Other Ambulatory Visit: Payer: Self-pay

## 2017-10-12 VITALS — BP 110/82 | HR 108 | Temp 98.8°F | Ht 72.0 in | Wt 184.0 lb

## 2017-10-12 DIAGNOSIS — R6889 Other general symptoms and signs: Secondary | ICD-10-CM

## 2017-10-12 DIAGNOSIS — J4541 Moderate persistent asthma with (acute) exacerbation: Secondary | ICD-10-CM

## 2017-10-12 MED ORDER — PREDNISONE 20 MG PO TABS: ORAL_TABLET | ORAL | 0 refills | Status: DC

## 2017-10-12 NOTE — Patient Instructions (Signed)
Complete prednisone course.  Rest, and push fluids. Use albuterol as needed for SOB and wheeze.  Can use mucinex DM twice daily for cough.  Call if new fever or not improving as expected.

## 2017-10-12 NOTE — Assessment & Plan Note (Signed)
Continue controllers.. Treat exacerbation with  prednsione taper.  Possible flu, past timeframe to use tamiflu.  Symptom control.

## 2017-10-12 NOTE — Progress Notes (Signed)
   Subjective:    Patient ID: Antonio Lara, male    DOB: 10/14/1975, 42 y.o.   MRN: [id number]  HPI  42 year old male pt of Dr. Cyndie Chimeopland's presents with continued cough.   He reports 2 weeks ago he had flu like symptoms (fatigued, emesis) lasted 4-5 days.   Now in last 4-5 days nasal congestion, cough, productive cough, coughing fits. Increased shortness of breath, wheeze.  Very tired.  Mild sore throat, no ear pain, no sinus pain.  No body aches, no fever, no chills.  Using albuterol  1-2 times daily.. Does not help with shortness of breath.   nml po intake, nmlUOP.   Hx of asthma.. On symbicort and Singulair.   works at [employer].. Sick contacts multiple.  No flu shot this year.  Blood pressure 110/82, pulse (!) 108, temperature 98.8 F (37.1 C), temperature source Oral, height 6' (1.829 m), weight 184 lb (83.5 kg), SpO2 98 %. Social History /Family History/Past Medical History reviewed in detail and updated in EMR if needed.  Review of Systems  Constitutional: Positive for fatigue. Negative for fever.  HENT: Positive for postnasal drip and rhinorrhea. Negative for ear pain.   Eyes: Negative for pain.  Respiratory: Positive for cough and shortness of breath.   Cardiovascular: Negative for chest pain, palpitations and leg swelling.  Gastrointestinal: Negative for abdominal pain.  Genitourinary: Negative for dysuria.  Musculoskeletal: Negative for arthralgias.  Neurological: Negative for syncope, light-headedness and headaches.  Psychiatric/Behavioral: Negative for dysphoric mood.       Objective:   Physical Exam  Constitutional: Vital signs are normal. He appears well-developed and well-nourished.  Non-toxic appearance. He does not appear ill. No distress.  HENT:  Head: Normocephalic and atraumatic.  Right Ear: Hearing, tympanic membrane, external ear and ear canal normal. No tenderness. No foreign bodies. Tympanic membrane is not retracted and not bulging.  Left  Ear: Hearing, tympanic membrane, external ear and ear canal normal. No tenderness. No foreign bodies. Tympanic membrane is not retracted and not bulging.  Nose: Nose normal. No mucosal edema or rhinorrhea. Right sinus exhibits no maxillary sinus tenderness and no frontal sinus tenderness. Left sinus exhibits no maxillary sinus tenderness and no frontal sinus tenderness.  Mouth/Throat: Uvula is midline, oropharynx is clear and moist and mucous membranes are normal. Normal dentition. No dental caries. No oropharyngeal exudate or tonsillar abscesses.  Eyes: Conjunctivae, EOM and lids are normal. Pupils are equal, round, and reactive to light. Lids are everted and swept, no foreign bodies found.  Neck: Trachea normal, normal range of motion and phonation normal. Neck supple. Carotid bruit is not present. No thyroid mass and no thyromegaly present.  Cardiovascular: Normal rate, regular rhythm, S1 normal, S2 normal, normal heart sounds, intact distal pulses and normal pulses. Exam reveals no gallop.  No murmur heard. Pulmonary/Chest: Effort normal and breath sounds normal. No respiratory distress. He has no wheezes. He has no rhonchi. He has no rales.  Abdominal: Soft. Normal appearance and bowel sounds are normal. There is no hepatosplenomegaly. There is no tenderness. There is no rebound, no guarding and no CVA tenderness. No hernia.  Neurological: He is alert. He has normal reflexes.  Skin: Skin is warm, dry and intact. No rash noted.  Psychiatric: He has a normal mood and affect. His speech is normal and behavior is normal. Judgment normal.          Assessment & Plan:

## 2017-10-16 ENCOUNTER — Encounter: Payer: Self-pay | Admitting: Family Medicine

## 2017-11-15 ENCOUNTER — Other Ambulatory Visit: Payer: Self-pay | Admitting: *Deleted

## 2017-11-15 MED ORDER — MONTELUKAST SODIUM 10 MG PO TABS: 10.0000 mg | ORAL_TABLET | Freq: Every day | ORAL | 3 refills | Status: DC

## 2018-01-09 ENCOUNTER — Encounter: Payer: Self-pay | Admitting: Pulmonary Disease

## 2018-01-09 ENCOUNTER — Ambulatory Visit: Payer: BLUE CROSS/BLUE SHIELD | Admitting: Pulmonary Disease

## 2018-01-09 VITALS — BP 118/84 | HR 87 | Ht 72.0 in | Wt 183.0 lb

## 2018-01-09 DIAGNOSIS — J4540 Moderate persistent asthma, uncomplicated: Secondary | ICD-10-CM

## 2018-01-09 DIAGNOSIS — G4733 Obstructive sleep apnea (adult) (pediatric): Secondary | ICD-10-CM | POA: Diagnosis not present

## 2018-01-09 DIAGNOSIS — R0683 Snoring: Secondary | ICD-10-CM | POA: Diagnosis not present

## 2018-01-09 MED ORDER — BUDESONIDE-FORMOTEROL FUMARATE 160-4.5 MCG/ACT IN AERO: INHALATION_SPRAY | RESPIRATORY_TRACT | 2 refills | Status: DC

## 2018-01-09 NOTE — Progress Notes (Signed)
PROBLEMS: Moderate persistent asthma Mild OSA  - intolerant to CPAP   DATA: Sleep study 02/02/16: AHI 11.8/hr with frequent arousals and sleep fragmentation. Spirometry 06/14/13: FEV1 3.85 liters (85% pred), FEV1/FVC 79% Spirometry 02/26/16:  FEV1 3.81 liters (84% pred), FEV1/FVC 81% CPAP compliance 11/05-12/04/17: Set pressure 9 cm H2O. Usage 20/30 days. 17 days > 4 hrs.  CPAP compliance : Set pressure 9 cm H2O. Usage 20/30 days. 17 days > 4 hrs  INTERVAL HISTORY: No major events  SUBJ: Routine reevaluation.  Overall, his asthma has been well controlled.  He did have some difficulties recently when the pollen counts were extremely high.  Presently, he is rarely requiring his rescue inhaler.  He remains on Symbicort 160-4.5 but is only using 1 inhalation once a day.  He is also on montelukast at bedtime.  In addition, he is using Claritin-D.  He remains off of CPAP therapy as he was intolerant to it despite multiple attempts.  Snoring remains a problem and he does describe mild daytime hypersomnolence.  This is in part due to the fact that his wife insists that he come to bed later than she does so that she may fall asleep first.  Denies CP, fever, purulent sputum, hemoptysis, LE edema and calf tenderness   OBJ: Vitals:   01/09/18 0914 01/09/18 0916  BP:  118/84  Pulse:  87  SpO2:  99%  Weight: 183 lb (83 kg)   Height: 6' (1.829 m)   RA   Gen: NAD HEENT: NCAT, sclera white Neck: No JVD Lungs: breath sounds full, no wheezes or other adventitious sounds Cardiovascular: RRR, no murmurs Abdomen: Soft, nontender, normal BS Ext: without clubbing, cyanosis, edema Neuro: grossly intact Skin: Limited exam, no lesions noted      Current Outpatient Medications on File Prior to Visit  Medication Sig Dispense Refill  . esomeprazole (NEXIUM) 20 MG capsule TAKE ONE CAPSULE BY MOUTH DAILY 90 capsule 3  . loratadine-pseudoephedrine (CLARITIN-D 24-HOUR) 10-240 MG 24 hr tablet Take 1  tablet by mouth daily. 90 tablet 1  . montelukast (SINGULAIR) 10 MG tablet Take 1 tablet (10 mg total) by mouth at bedtime. 90 tablet 3  . Multiple Vitamin (MULTIVITAMIN) tablet Take 1 tablet by mouth daily.    Marland Kitchen PROAIR HFA 108 (90 Base) MCG/ACT inhaler INHALE 2 PUFFS INTO THE LUNGS EVERY 6 HOURS AS NEEDED FOR COUGH 8.5 g 2   No current facility-administered medications on file prior to visit.      DATA: BMP Latest Ref Rng & Units 03/18/2008 03/13/2008  Glucose 70 - 99 mg/dL 94 098(J)  BUN 6 - 23 mg/dL 17 10  Creatinine 0.4 - 1.5 mg/dL 1.2 1.0  Sodium 191 - 478 meq/L 140 140  Potassium 3.5 - 5.1 meq/L 4.1 3.2(L)  Chloride 96 - 112 meq/L 103 102  CO2 19 - 32 meq/L 30 29  Calcium 8.4 - 10.5 mg/dL 9.7 9.3   No flowsheet data found.  CXR: NNF  IMPRESSION:   ICD-10-CM   1. Moderate persistent asthma without complication J45.40   2. Mild OSA, intolerant to CPAP G47.33 Consult to ear nose and throat    Ambulatory referral to ENT  3. Snoring R06.83 Consult to ear nose and throat    Ambulatory referral to ENT    PLAN: Continue Symbicort inhaler-I encouraged that he increase to one inhalation twice a day Continue Singulair (montelukast) daily Continue albuterol inhaler as needed Referral made to ENT for evaluation of severe snoring Follow-up in 6  months or sooner as needed   Billy Fischer, MD PCCM service Mobile [phone] Pager [phone] 01/09/2018 9:28 AM

## 2018-01-09 NOTE — Patient Instructions (Addendum)
Continue Symbicort inhaler-1 inhalation twice a day.  Rinse mouth after use Continue Singulair (montelukast) daily Continue albuterol inhaler as needed Referral made to ENT for evaluation of severe snoring Follow-up in 6 months or sooner as needed

## 2018-01-12 ENCOUNTER — Encounter: Payer: Self-pay | Admitting: Family Medicine

## 2018-01-12 MED ORDER — TIZANIDINE HCL 4 MG PO TABS: 4.0000 mg | ORAL_TABLET | Freq: Every day | ORAL | 2 refills | Status: AC

## 2018-01-17 ENCOUNTER — Other Ambulatory Visit: Payer: Self-pay

## 2018-01-17 ENCOUNTER — Ambulatory Visit: Payer: BLUE CROSS/BLUE SHIELD | Admitting: Family Medicine

## 2018-01-17 ENCOUNTER — Encounter: Payer: Self-pay | Admitting: Family Medicine

## 2018-01-17 VITALS — BP 110/70 | HR 79 | Temp 98.4°F | Ht 72.0 in | Wt 184.5 lb

## 2018-01-17 DIAGNOSIS — N451 Epididymitis: Secondary | ICD-10-CM

## 2018-01-17 DIAGNOSIS — J0100 Acute maxillary sinusitis, unspecified: Secondary | ICD-10-CM | POA: Diagnosis not present

## 2018-01-17 DIAGNOSIS — M545 Low back pain, unspecified: Secondary | ICD-10-CM

## 2018-01-17 MED ORDER — LEVOFLOXACIN 500 MG PO TABS: 500.0000 mg | ORAL_TABLET | Freq: Every day | ORAL | 0 refills | Status: DC

## 2018-01-17 NOTE — Progress Notes (Signed)
Dr. Karleen Hampshire T. Graesyn Schreifels, MD, CAQ [facility] [address] [place], [address] Phone: [phone] Fax: [phone]  01/17/2018  Patient: Antonio Lara, MRN: [id number], DOB: 1975/09/14, 42 y.o.  Primary Physician:  Hannah Beat, MD   Chief Complaint  Patient presents with  . Back Pain  . Sinusitis   Subjective:   Antonio Lara is a 42 y.o. very pleasant male patient who presents with the following:  Ongoing back pain and sinusitis.   Resolved swelling in the feet. Had some significant lower back pain and into the glutes, then severe R > L, most of lumbar spine. NSAIDS for this time. Also some zanaflex.  Some icing.  Initially, this was hurting pretty bad, and then over the last 24 hours or so it is gotten better.  He states that this is about 90% better at this point.  No back injury.   Testicles.  He states that he is having some pain on the posterior aspect of his right testicle.  He is not had any enlargement, trauma, and no possible sexual exposure, and he and his wife have been monogamous for many years.  He also has some sinus pressure and pain.  This is been ongoing for some time.  Past Medical History, Surgical History, Social History, Family History, Problem List, Medications, and Allergies have been reviewed and updated if relevant.  Patient Active Problem List   Diagnosis Date Noted  . Asthma exacerbation, non-allergic, moderate persistent 10/12/2017  . GERD 07/10/2009  . BACK PAIN, LUMBAR 06/10/2008  . Allergic rhinitis 04/08/2008    Past Medical History:  Diagnosis Date  . Asthma, exercise induced         ?  . Back pain, chronic    Dr. Annell Greening  . GERD (gastroesophageal reflux disease)   . Mononucleosis    in high school    History reviewed. No pertinent surgical history.  Social History   Socioeconomic History  . Marital status: Married    Spouse name: Not on file  . Number of children:  2  . Years of education: Not on file  . Highest education level: Not on file  Occupational History  . Occupation: Investment banker, corporate: [employer]  Social Needs  . Financial resource strain: Not on file  . Food insecurity:    Worry: Not on file    Inability: Not on file  . Transportation needs:    Medical: Not on file    Non-medical: Not on file  Tobacco Use  . Smoking status: Never Smoker  . Smokeless tobacco: Never Used  Substance and Sexual Activity  . Alcohol use: Yes    Comment: rarely  . Drug use: No  . Sexual activity: Not on file  Lifestyle  . Physical activity:    Days per week: Not on file    Minutes per session: Not on file  . Stress: Not on file  Relationships  . Social connections:    Talks on phone: Not on file    Gets together: Not on file    Attends religious service: Not on file    Active member of club or organization: Not on file    Attends meetings of clubs or organizations: Not on file    Relationship status: Not on file  . Intimate partner violence:    Fear of current or ex partner: Not on file    Emotionally abused: Not on file  Physically abused: Not on file    Forced sexual activity: Not on file  Other Topics Concern  . Not on file  Social History Narrative   Drinks one caffeinated beverage a day.    Family History  Problem Relation Age of Onset  . Breast cancer Mother   . Stroke Father   . Cancer Maternal Grandfather     Allergies  Allergen Reactions  . Oxycodone-Acetaminophen     REACTION: extemely dizzy with nausea and vomiting.    Medication list reviewed and updated in full in [facility].  GEN: No fevers, chills. Nontoxic. Primarily MSK c/o today. MSK: Detailed in the HPI GI: tolerating PO intake without difficulty Neuro: No numbness, parasthesias, or tingling associated. Otherwise the pertinent positives of the ROS are noted above.   Objective:   BP 110/70   Pulse 79   Temp 98.4 F (36.9 C) (Oral)   Ht  6' (1.829 m)   Wt 184 lb 8 oz (83.7 kg)   BMI 25.02 kg/m    Gen: WDWN, NAD; alert,appropriate and cooperative throughout exam    HEENT: Normocephalic and atraumatic. Throat clear, w/o exudate, no LAD, R TM clear, L TM - good landmarks, No fluid present. rhinnorhea.  Left frontal and maxillary sinuses: Tender Right frontal and maxillary sinuses: Tender    Neck: No ant or post LAD  CV: RRR, No M/G/R  Pulm: Breathing comfortably in no resp distress. no w/c/r  Abd: S,NT,ND,+BS Extr: no c/c/e Psych: full affect, pleasant   Both testicles are free of masses, but there is some tenderness on the posterior aspect of the right testicle in the region of the epididymis.  Range of motion at  the waist: Flexion: normal Extension: normal Lateral bending: normal Rotation: all normal  No echymosis or edema Rises to examination table with no difficulty Gait: non antalgic  Inspection/Deformity: N Paraspinus Tenderness: l2-s1 b  B Ankle Dorsiflexion (L5,4): 5/5 B Great Toe Dorsiflexion (L5,4): 5/5 Heel Walk (L5): WNL Toe Walk (S1): WNL Rise/Squat (L4): WNL  SENSORY B Medial Foot (L4): WNL B Dorsum (L5): WNL B Lateral (S1): WNL Light Touch: WNL Pinprick: WNL  REFLEXES Knee (L4): 2+ Ankle (S1): 2+  B SLR, seated: neg B SLR, supine: neg B FABER: neg B Reverse FABER: neg B Greater Troch: NT B Log Roll: neg B Stork: NT B Sciatic Notch: NT   Radiology: No results found.  Assessment and Plan:   Acute right-sided low back pain without sciatica  Acute non-recurrent maxillary sinusitis  Epididymitis  Mechanical back pain, reassuring and improving quite a bit in a short time.  Continue with ongoing plan of care, NSAIDs, heat, as well as muscle relaxants if needed.  Course of Levaquin which would treat for both sinusitis and potentially epididymitis as well.  Follow-up: No follow-ups on file.  Meds ordered this encounter  Medications  . levofloxacin (LEVAQUIN) 500 MG  tablet    Sig: Take 1 tablet (500 mg total) by mouth daily.    Dispense:  10 tablet    Refill:  0   Signed,  Kristapher [place] T. Destyn Schuyler, MD   Allergies as of 01/17/2018      Reactions   Oxycodone-acetaminophen    REACTION: extemely dizzy with nausea and vomiting.      Medication List        Accurate as of 01/17/18  1:48 PM. Always use your most recent med list.          budesonide-formoterol 160-4.5 MCG/ACT inhaler  Commonly known as:  SYMBICORT INHALE ONE PUFF INTO THE LUNGS TWICE A DAY. RINSE, GARGE, SPIT AFTER USE.   esomeprazole 20 MG capsule Commonly known as:  NEXIUM TAKE ONE CAPSULE BY MOUTH DAILY   levofloxacin 500 MG tablet Commonly known as:  LEVAQUIN Take 1 tablet (500 mg total) by mouth daily.   loratadine-pseudoephedrine 10-240 MG 24 hr tablet Commonly known as:  CLARITIN-D 24-hour Take 1 tablet by mouth daily.   montelukast 10 MG tablet Commonly known as:  SINGULAIR Take 1 tablet (10 mg total) by mouth at bedtime.   multivitamin tablet Take 1 tablet by mouth daily.   PROAIR HFA 108 (90 Base) MCG/ACT inhaler Generic drug:  albuterol INHALE 2 PUFFS INTO THE LUNGS EVERY 6 HOURS AS NEEDED FOR COUGH   tiZANidine 4 MG tablet Commonly known as:  ZANAFLEX Take 1 tablet (4 mg total) by mouth at bedtime. Prn muscle spasm

## 2018-01-23 DIAGNOSIS — R0683 Snoring: Secondary | ICD-10-CM | POA: Diagnosis not present

## 2018-01-23 DIAGNOSIS — G4733 Obstructive sleep apnea (adult) (pediatric): Secondary | ICD-10-CM | POA: Diagnosis not present

## 2018-01-25 ENCOUNTER — Telehealth: Payer: Self-pay | Admitting: Pulmonary Disease

## 2018-01-25 NOTE — Telephone Encounter (Signed)
Beth from [facility] calling in regards to referral that was sent  They have received a CPAP Trial from 02/19/2016 but they will need a copy of the actual sleep study before that Please call with any questions Please fax to [phone]

## 2018-01-25 NOTE — Telephone Encounter (Signed)
Records have been faxed.

## 2018-04-11 ENCOUNTER — Other Ambulatory Visit: Payer: Self-pay | Admitting: *Deleted

## 2018-04-11 MED ORDER — LORATADINE-PSEUDOEPHEDRINE ER 10-240 MG PO TB24: 1.0000 | ORAL_TABLET | Freq: Every day | ORAL | 1 refills | Status: DC

## 2018-04-27 DIAGNOSIS — G4733 Obstructive sleep apnea (adult) (pediatric): Secondary | ICD-10-CM | POA: Diagnosis not present

## 2018-07-03 ENCOUNTER — Other Ambulatory Visit: Payer: Self-pay | Admitting: *Deleted

## 2018-07-03 MED ORDER — ESOMEPRAZOLE MAGNESIUM 20 MG PO CPDR: 20.0000 mg | DELAYED_RELEASE_CAPSULE | Freq: Every day | ORAL | 1 refills | Status: DC

## 2018-07-13 ENCOUNTER — Encounter: Payer: Self-pay | Admitting: Pulmonary Disease

## 2018-07-13 ENCOUNTER — Ambulatory Visit: Payer: BLUE CROSS/BLUE SHIELD | Admitting: Pulmonary Disease

## 2018-07-13 VITALS — BP 132/90 | HR 84 | Resp 16 | Ht 72.0 in | Wt 185.0 lb

## 2018-07-13 DIAGNOSIS — J4540 Moderate persistent asthma, uncomplicated: Secondary | ICD-10-CM

## 2018-07-13 DIAGNOSIS — G4733 Obstructive sleep apnea (adult) (pediatric): Secondary | ICD-10-CM | POA: Diagnosis not present

## 2018-07-13 DIAGNOSIS — Z789 Other specified health status: Secondary | ICD-10-CM

## 2018-07-13 NOTE — Patient Instructions (Signed)
Continue Symbicort 160-4.5, 2 actuations daily.  Double this up to twice a day when in terms of asthma seem to be flaring  Continue montelukast at bedtime  Continue albuterol inhaler as needed  Follow-up in 6 months.  Call sooner if needed

## 2018-07-15 NOTE — Progress Notes (Signed)
PROBLEMS: Moderate persistent asthma Mild OSA  - intolerant to CPAP   DATA: Sleep study 02/02/16: AHI 11.8/hr with frequent arousals and sleep fragmentation. Spirometry 06/14/13: FEV1 3.85 liters (85% pred), FEV1/FVC 79% Spirometry 02/26/16:  FEV1 3.81 liters (84% pred), FEV1/FVC 81% CPAP compliance 11/05-12/04/17: Set pressure 9 cm H2O. Usage 20/30 days. 17 days > 4 hrs.  CPAP compliance : Set pressure 9 cm H2O. Usage 20/30 days. 17 days > 4 hrs  INTERVAL HISTORY: No major events  SUBJ: This is a scheduled reevaluation.  His asthma remains well controlled on Symbicort and montelukast. HE continues to rarely require his rescue inhaler.  He is now using a dental appliance for OSA with improved snoring and minimal daytime hypersomnolence.   He has no new complaints. Denies CP, fever, purulent sputum, hemoptysis, LE edema and calf tenderness.   OBJ: Vitals:   07/13/18 0911 07/13/18 0914  BP:  132/90  Pulse:  84  Resp: 16   SpO2:  98%  Weight: 185 lb (83.9 kg)   Height: 6' (1.829 m)   RA  Gen: NAD HEENT: NCAT, sclera white Neck: No JVD Lungs: breath sounds full, no wheezes or other adventitious sounds Cardiovascular: RRR, no murmurs Abdomen: Soft, nontender, normal BS Ext: without clubbing, cyanosis, edema Neuro: grossly intact Skin: Limited exam, no lesions noted    Current Outpatient Medications on File Prior to Visit  Medication Sig Dispense Refill  . budesonide-formoterol (SYMBICORT) 160-4.5 MCG/ACT inhaler INHALE ONE PUFF INTO THE LUNGS TWICE A DAY. RINSE, GARGE, SPIT AFTER USE. 3 Inhaler 2  . esomeprazole (NEXIUM) 20 MG capsule Take 1 capsule (20 mg total) by mouth daily. 90 capsule 1  . loratadine-pseudoephedrine (CLARITIN-D 24-HOUR) 10-240 MG 24 hr tablet Take 1 tablet by mouth daily. 90 tablet 1  . montelukast (SINGULAIR) 10 MG tablet Take 1 tablet (10 mg total) by mouth at bedtime. 90 tablet 3  . Multiple Vitamin (MULTIVITAMIN) tablet Take 1 tablet by mouth  daily.    Marland Kitchen. PROAIR HFA 108 (90 Base) MCG/ACT inhaler INHALE 2 PUFFS INTO THE LUNGS EVERY 6 HOURS AS NEEDED FOR COUGH 8.5 g 2   No current facility-administered medications on file prior to visit.      DATA: BMP Latest Ref Rng & Units 03/18/2008 03/13/2008  Glucose 70 - 99 mg/dL 94 161(W126(H)  BUN 6 - 23 mg/dL 17 10  Creatinine 0.4 - 1.5 mg/dL 1.2 1.0  Sodium 960135 - 454145 meq/L 140 140  Potassium 3.5 - 5.1 meq/L 4.1 3.2(L)  Chloride 96 - 112 meq/L 103 102  CO2 19 - 32 meq/L 30 29  Calcium 8.4 - 10.5 mg/dL 9.7 9.3   No flowsheet data found.  CXR: NNF  IMPRESSION:   ICD-10-CM   1. Moderate persistent asthma without complication J45.40   2. OSA (obstructive sleep apnea) G47.33   3. CPAP intolerance.  Snoring well-controlled with dental appliance Z78.9     PLAN: Continue Symbicort 160-4.5, 2 actuations daily.  Double this up to twice a day when in terms of asthma seem to be flaring  Continue montelukast at bedtime  Continue albuterol inhaler as needed  Follow-up in 6 months.  Call sooner if needed   Billy Fischeravid , MD PCCM service Mobile [phone] Pager [phone] 07/15/2018 3:21 PM

## 2018-10-30 ENCOUNTER — Other Ambulatory Visit: Payer: Self-pay | Admitting: Family Medicine

## 2018-11-13 DIAGNOSIS — M9901 Segmental and somatic dysfunction of cervical region: Secondary | ICD-10-CM | POA: Diagnosis not present

## 2018-11-13 DIAGNOSIS — M7918 Myalgia, other site: Secondary | ICD-10-CM | POA: Diagnosis not present

## 2018-11-13 DIAGNOSIS — M9903 Segmental and somatic dysfunction of lumbar region: Secondary | ICD-10-CM | POA: Diagnosis not present

## 2018-12-03 ENCOUNTER — Ambulatory Visit (INDEPENDENT_AMBULATORY_CARE_PROVIDER_SITE_OTHER)
Admission: RE | Admit: 2018-12-03 | Discharge: 2018-12-03 | Disposition: A | Discharge status: Home / Self Care | Payer: BLUE CROSS/BLUE SHIELD | Source: Ambulatory Visit | Attending: Family Medicine | Admitting: Family Medicine

## 2018-12-03 ENCOUNTER — Encounter: Payer: Self-pay | Admitting: Family Medicine

## 2018-12-03 ENCOUNTER — Ambulatory Visit: Payer: BLUE CROSS/BLUE SHIELD | Admitting: Family Medicine

## 2018-12-03 ENCOUNTER — Other Ambulatory Visit: Payer: Self-pay

## 2018-12-03 ENCOUNTER — Other Ambulatory Visit: Payer: Self-pay | Admitting: Family Medicine

## 2018-12-03 VITALS — BP 120/86 | HR 104 | Temp 98.7°F | Ht 72.0 in | Wt 184.2 lb

## 2018-12-03 DIAGNOSIS — M545 Low back pain, unspecified: Secondary | ICD-10-CM

## 2018-12-03 DIAGNOSIS — M79605 Pain in left leg: Secondary | ICD-10-CM

## 2018-12-03 DIAGNOSIS — M79604 Pain in right leg: Secondary | ICD-10-CM

## 2018-12-03 DIAGNOSIS — R208 Other disturbances of skin sensation: Secondary | ICD-10-CM

## 2018-12-03 MED ORDER — AMITRIPTYLINE HCL 25 MG PO TABS: 25.0000 mg | ORAL_TABLET | Freq: Every day | ORAL | 1 refills | Status: DC

## 2018-12-03 MED ORDER — PREDNISONE 20 MG PO TABS: ORAL_TABLET | ORAL | 0 refills | Status: DC

## 2018-12-03 NOTE — Progress Notes (Signed)
Jud Fanguy T. Alleene Stoy, MD Primary Care and Sports Medicine [facility] [address] [place], [address] Phone: [phone]   FAX: [phone]  Antonio DecemberWhitaker L Agrawal - 43 y.o. male   MRN [id number]   Date of Birth: 12-Feb-1976  Visit Date: 12/03/2018   PCP: Hannah Beatopland, Corbitt Cloke, MD   Referred by: Hannah Beatopland, Yarissa Reining, MD  Chief Complaint  Patient presents with   Back Pain   Subjective:   Antonio Lara is a 43 y.o. very pleasant male patient who presents with the following:  Marlana SalvageWitt is a very nice patient who I remember quite well, and he presents with some acute back pain, this is in a history of well-known intermittent low back pain.  He is now having some intermittent relief myelopathy, primarily on the right.  This is a acute on chronic issue, that is been only going on and flared up over the last 1 to 2 months, but this worsened over the weekend.  He has now some abnormal sensation in both feet's.  It feels as if he is having some burning.  He is not had any trauma, but he did pick up a TV and twisted wrong.  He does work at [employer].  No bowel or bladder incontinence.  Back and feels like the skin is like jello B. No str changes. Burning.   B knees some.  No trauma, did have to pick up a tv recently and turned wrong.  Reaggravated. 1 month or 2.  r thigh sh Top of foot rl webspace dec  slr pain I nback    Past Medical History, Surgical History, Social History, Family History, Problem List, Medications, and Allergies have been reviewed and updated if relevant.  Patient Active Problem List   Diagnosis Date Noted   Asthma exacerbation, non-allergic, moderate persistent 10/12/2017   GERD 07/10/2009   BACK PAIN, LUMBAR 06/10/2008   Allergic rhinitis 04/08/2008    Past Medical History:  Diagnosis Date   Asthma, exercise induced         ?   Back pain, chronic    Dr. Annell GreeningMark Yates   GERD (gastroesophageal reflux disease)     Mononucleosis    in high school    History reviewed. No pertinent surgical history.  Social History   Socioeconomic History   Marital status: Married    Spouse name: Not on file   Number of children: 2   Years of education: Not on file   Highest education level: Not on file  Occupational History   Occupation: Investment banker, corporateales    Employer: [employer]  Social Needs   Financial resource strain: Not on file   Food insecurity:    Worry: Not on file    Inability: Not on file   Transportation needs:    Medical: Not on file    Non-medical: Not on file  Tobacco Use   Smoking status: Never Smoker   Smokeless tobacco: Never Used  Substance and Sexual Activity   Alcohol use: Yes    Comment: rarely   Drug use: No   Sexual activity: Not on file  Lifestyle   Physical activity:    Days per week: Not on file    Minutes per session: Not on file   Stress: Not on file  Relationships   Social connections:    Talks on phone: Not on file    Gets together: Not on file    Attends religious service: Not on  file    Active member of club or organization: Not on file    Attends meetings of clubs or organizations: Not on file    Relationship status: Not on file   Intimate partner violence:    Fear of current or ex partner: Not on file    Emotionally abused: Not on file    Physically abused: Not on file    Forced sexual activity: Not on file  Other Topics Concern   Not on file  Social History Narrative   Drinks one caffeinated beverage a day.    Family History  Problem Relation Age of Onset   Breast cancer Mother    Stroke Father    Cancer Maternal Grandfather     Allergies  Allergen Reactions   Oxycodone-Acetaminophen     REACTION: extemely dizzy with nausea and vomiting.    Medication list reviewed and updated in full in [facility].  GEN: no acute illness or fever CV: No chest pain or shortness of breath MSK: detailed above Neuro: neurological  signs are described above ROS O/w per HPI  Objective:   BP 120/86    Pulse (!) 104    Temp 98.7 F (37.1 C) (Oral)    Ht 6' (1.829 m)    Wt 184 lb 4 oz (83.6 kg)    SpO2 99%    BMI 24.99 kg/m    GEN: Well-developed,well-nourished,in no acute distress; alert,appropriate and cooperative throughout examination HEENT: Normocephalic and atraumatic without obvious abnormalities. Ears, externally no deformities PULM: Breathing comfortably in no respiratory distress EXT: No clubbing, cyanosis, or edema PSYCH: Normally interactive. Cooperative during the interview. Pleasant. Friendly and conversant. Not anxious or depressed appearing. Normal, full affect.  Range of motion at  the waist: Flexion, extension, lateral bending and rotation:   No echymosis or edema Rises to examination table with mild difficulty Gait: minimally antalgic  Inspection/Deformity: N Paraspinus Tenderness: l3-s1 R > L  B Ankle Dorsiflexion (L5,4): 5/5 B Great Toe Dorsiflexion (L5,4): 5/5 Heel Walk (L5): WNL Toe Walk (S1): WNL Rise/Squat (L4): WNL, mild pain  SENSORY B Medial Foot (L4): WNL B Dorsum (L5): pinprick dec and light B Lateral (S1): WNL R dorsal thigh dec pinprick and light touch T 1-2 webspace is decreased sensation to pinprick  REFLEXES Knee (L4): 1+ Ankle (S1): 1+  B SLR, seated: neg B SLR, supine: neg B FABER: neg B Reverse FABER: neg B Greater Troch: NT B Log Roll: neg B Sciatic Notch: NT   Radiology: Dg Lumbar Spine Complete  Result Date: 12/03/2018 CLINICAL DATA:  Lumbar pain EXAM: LUMBAR SPINE - COMPLETE 4+ VIEW COMPARISON:  09/23/2014 FINDINGS: Five lumbar type vertebral bodies are well visualized. Vertebral body height is well maintained. No pars defects are seen. No anterolisthesis is noted. Mild disc space narrowing at L5-S1 is seen. No soft tissue abnormality is noted. IMPRESSION: No acute abnormality noted.  Mild disc space narrowing is seen. Electronically Signed   By: Alcide Clever M.D.   On: 12/03/2018 19:52     Assessment and Plan:   Lumbar pain with radiation down both legs - Plan: DG Lumbar Spine Complete  Decreased sensation of foot - Plan: DG Lumbar Spine Complete   Acute on chronic back pain with radicular symptoms, more concerning about the altered sensation in his feet.  He has a known L5-S1 herniation seen on 2016 MRI of the lumbar spine. Plain film grossly unremarkable.   I am going to give him 14 days of  steroids, start him on amitriptyline, and recheck him in 4 to 6 weeks.  Meds ordered this encounter  Medications   predniSONE (DELTASONE) 20 MG tablet    Sig: 2 tabs po for 7 days, then 1 tab po for 7 days    Dispense:  21 tablet    Refill:  0   amitriptyline (ELAVIL) 25 MG tablet    Sig: Take 1 tablet (25 mg total) by mouth at bedtime.    Dispense:  30 tablet    Refill:  1   Orders Placed This Encounter  Procedures   DG Lumbar Spine Complete    Signed,  Tanga Gloor T. Tyara Dassow, MD   Outpatient Encounter Medications as of 12/03/2018  Medication Sig   budesonide-formoterol (SYMBICORT) 160-4.5 MCG/ACT inhaler INHALE ONE PUFF INTO THE LUNGS TWICE A DAY. RINSE, GARGE, SPIT AFTER USE.   esomeprazole (NEXIUM) 20 MG capsule Take 1 capsule (20 mg total) by mouth daily.   GNP ALLERGY & CONGESTION 10-240 MG 24 hr tablet TAKE 1 TABLET BY MOUTH ONCE DAILY   montelukast (SINGULAIR) 10 MG tablet TAKE 1 TABLET BY MOUTH DAILY AT BEDTIME   Multiple Vitamin (MULTIVITAMIN) tablet Take 1 tablet by mouth daily.   PROAIR HFA 108 (90 Base) MCG/ACT inhaler INHALE 2 PUFFS INTO THE LUNGS EVERY 6 HOURS AS NEEDED FOR COUGH   amitriptyline (ELAVIL) 25 MG tablet Take 1 tablet (25 mg total) by mouth at bedtime.   predniSONE (DELTASONE) 20 MG tablet 2 tabs po for 7 days, then 1 tab po for 7 days   No facility-administered encounter medications on file as of 12/03/2018.

## 2018-12-03 NOTE — Telephone Encounter (Signed)
With lumbar radiculopathy and possible neurological changes, I should see him face to face in the office.

## 2019-01-03 ENCOUNTER — Other Ambulatory Visit: Payer: Self-pay | Admitting: *Deleted

## 2019-01-03 MED ORDER — AMITRIPTYLINE HCL 25 MG PO TABS: 25.0000 mg | ORAL_TABLET | Freq: Every day | ORAL | 0 refills | Status: DC

## 2019-01-03 MED ORDER — ESOMEPRAZOLE MAGNESIUM 20 MG PO CPDR: 20.0000 mg | DELAYED_RELEASE_CAPSULE | Freq: Every day | ORAL | 1 refills | Status: DC

## 2019-02-04 ENCOUNTER — Encounter: Payer: Self-pay | Admitting: Family Medicine

## 2019-02-04 ENCOUNTER — Ambulatory Visit (INDEPENDENT_AMBULATORY_CARE_PROVIDER_SITE_OTHER): Payer: BC Managed Care – PPO | Admitting: Family Medicine

## 2019-02-04 ENCOUNTER — Other Ambulatory Visit: Payer: Self-pay

## 2019-02-04 VITALS — BP 130/78 | HR 90 | Temp 98.0°F | Ht 72.0 in | Wt 188.2 lb

## 2019-02-04 DIAGNOSIS — M79605 Pain in left leg: Secondary | ICD-10-CM

## 2019-02-04 DIAGNOSIS — M79604 Pain in right leg: Secondary | ICD-10-CM

## 2019-02-04 DIAGNOSIS — M545 Low back pain: Secondary | ICD-10-CM

## 2019-02-04 NOTE — Progress Notes (Signed)
Antonio Lara T. Calli Bashor, MD Primary Care and Sports Medicine [facility] [address] [place], [address] Phone: [phone]  FAX: [phone]  Antonio Lara Dilorenzo - 43 y.o. male  MRN [id number]  Date of Birth: [birth year]  Visit Date: 02/04/2019  PCP: Hannah Beatopland, Bonnie Roig, MD  Referred by: Hannah Beatopland, Saylee Sherrill, MD  Chief Complaint  Patient presents with  . Back Pain    Still has pain at times but overall better   Subjective:   Antonio Lara Brailsford is a 43 y.o. very pleasant male patient who presents with the following: Back Pain  F/u LBP with foot numbess:  Pain much much better, did have some burning  R side and some on the left.  Some in the neck.   He is really doing a lot better, he has only minimal pain now.  No radiculopathy and no numbness or tingling.  Past Medical History, Surgical History, Family History, Medications, Allergies have been reviewed and updated if relevant.  Patient Active Problem List   Diagnosis Date Noted  . Asthma exacerbation, non-allergic, moderate persistent 10/12/2017  . GERD 07/10/2009  . BACK PAIN, LUMBAR 06/10/2008  . Allergic rhinitis 04/08/2008    Past Medical History:  Diagnosis Date  . Asthma, exercise induced         ?  . Back pain, chronic    Dr. Annell GreeningMark Yates  . GERD (gastroesophageal reflux disease)   . Mononucleosis    in high school    No past surgical history on file.  Social History   Socioeconomic History  . Marital status: Married    Spouse name: Not on file  . Number of children: 2  . Years of education: Not on file  . Highest education level: Not on file  Occupational History  . Occupation: Investment banker, corporateales    Employer: [employer]  Social Needs  . Financial resource strain: Not on file  . Food insecurity    Worry: Not on file    Inability: Not on file  . Transportation needs    Medical: Not on file    Non-medical: Not on file  Tobacco Use  . Smoking status: Never Smoker   . Smokeless tobacco: Never Used  Substance and Sexual Activity  . Alcohol use: Yes    Comment: rarely  . Drug use: No  . Sexual activity: Not on file  Lifestyle  . Physical activity    Days per week: Not on file    Minutes per session: Not on file  . Stress: Not on file  Relationships  . Social Musicianconnections    Talks on phone: Not on file    Gets together: Not on file    Attends religious service: Not on file    Active member of club or organization: Not on file    Attends meetings of clubs or organizations: Not on file    Relationship status: Not on file  . Intimate partner violence    Fear of current or ex partner: Not on file    Emotionally abused: Not on file    Physically abused: Not on file    Forced sexual activity: Not on file  Other Topics Concern  . Not on file  Social History Narrative   Drinks one caffeinated beverage a day.    Family History  Problem Relation Age of Onset  . Breast cancer Mother   . Stroke Father   . Cancer Maternal Grandfather  Allergies  Allergen Reactions  . Oxycodone-Acetaminophen     REACTION: extemely dizzy with nausea and vomiting.    Medication list reviewed and updated in full in [place].  GEN: No fevers, chills. Nontoxic. Primarily MSK c/o today. MSK: Detailed in the HPI GI: tolerating PO intake without difficulty Neuro: As above  Otherwise the pertinent positives of the ROS are noted above.    Objective:   Blood pressure 130/78, pulse 90, temperature 98 F (36.7 C), temperature source Other (Comment), height 6' (1.829 m), weight 188 lb 4 oz (85.4 kg), SpO2 98 %.  Gen: Well-developed,well-nourished,in no acute distress; alert,appropriate and cooperative throughout examination HEENT: Normocephalic and atraumatic without obvious abnormalities.  Ears, externally no deformities Pulm: Breathing comfortably in no respiratory distress Range of motion at  the waist: Flexion, rotation and lateral bending: full  No  echymosis or edema Rises to examination table with no difficulty Gait: minimally antalgic  Inspection/Deformity: No abnormality Paraspinus T:  Minimal on R  B Ankle Dorsiflexion (L5,4): 5/5 B Great Toe Dorsiflexion (L5,4): 5/5 Heel Walk (L5): WNL Toe Walk (S1): WNL Rise/Squat (L4): WNL, mild pain  SENSORY B Medial Foot (L4): WNL B Dorsum (L5): WNL B Lateral (S1): WNL Light Touch: WNL Pinprick: WNL  REFLEXES Knee (L4): 2+ Ankle (S1): 2+  B SLR, seated: neg B SLR, supine: neg B FABER: neg B Reverse FABER: neg B Greater Troch: NT B Log Roll: neg B Stork: NT B Sciatic Notch: NT  Radiology: No results found.  Assessment and Plan:   Lumbar pain with radiation down both legs  Much better, resolved disk  Cont elavil 6 weeks and d/c  Follow-up: prn  No orders of the defined types were placed in this encounter.  No orders of the defined types were placed in this encounter.   Signed,  Maud Deed. Nethra Mehlberg, MD   Allergies as of 02/04/2019      Reactions   Oxycodone-acetaminophen    REACTION: extemely dizzy with nausea and vomiting.      Medication List       Accurate as of February 04, 2019  8:38 AM. If you have any questions, ask your nurse or doctor.        STOP taking these medications   predniSONE 20 MG tablet Commonly known as: DELTASONE Stopped by: Owens Loffler, MD     TAKE these medications   amitriptyline 25 MG tablet Commonly known as: ELAVIL Take 1 tablet (25 mg total) by mouth at bedtime.   budesonide-formoterol 160-4.5 MCG/ACT inhaler Commonly known as: Symbicort INHALE ONE PUFF INTO THE LUNGS TWICE A DAY. RINSE, GARGE, SPIT AFTER USE.   esomeprazole 20 MG capsule Commonly known as: NEXIUM Take 1 capsule (20 mg total) by mouth daily.   GNP Allergy & Congestion 10-240 MG 24 hr tablet Generic drug: loratadine-pseudoephedrine TAKE 1 TABLET BY MOUTH ONCE DAILY   montelukast 10 MG tablet Commonly known as: SINGULAIR TAKE 1 TABLET BY  MOUTH DAILY AT BEDTIME   multivitamin tablet Take 1 tablet by mouth daily.   ProAir HFA 108 (90 Base) MCG/ACT inhaler Generic drug: albuterol INHALE 2 PUFFS INTO THE LUNGS EVERY 6 HOURS AS NEEDED FOR COUGH

## 2019-02-26 ENCOUNTER — Other Ambulatory Visit: Payer: Self-pay | Admitting: Family Medicine

## 2019-03-19 ENCOUNTER — Other Ambulatory Visit: Payer: Self-pay | Admitting: *Deleted

## 2019-03-19 NOTE — Telephone Encounter (Signed)
Will send to Dr. Alva Garnet who see Antonio Lara for his asthma.

## 2019-03-20 MED ORDER — MONTELUKAST SODIUM 10 MG PO TABS: 10.0000 mg | ORAL_TABLET | Freq: Every day | ORAL | 1 refills | Status: DC

## 2019-03-20 NOTE — Telephone Encounter (Signed)
Dr. Lorelei Pont.  Are you okay refilling this or does this need to come from Dr. Alva Garnet?

## 2019-03-21 ENCOUNTER — Ambulatory Visit (INDEPENDENT_AMBULATORY_CARE_PROVIDER_SITE_OTHER): Payer: BC Managed Care – PPO | Admitting: Pulmonary Disease

## 2019-03-21 ENCOUNTER — Encounter: Payer: Self-pay | Admitting: Pulmonary Disease

## 2019-03-21 DIAGNOSIS — G4733 Obstructive sleep apnea (adult) (pediatric): Secondary | ICD-10-CM | POA: Diagnosis not present

## 2019-03-21 DIAGNOSIS — J4540 Moderate persistent asthma, uncomplicated: Secondary | ICD-10-CM | POA: Diagnosis not present

## 2019-03-21 DIAGNOSIS — Z789 Other specified health status: Secondary | ICD-10-CM

## 2019-03-21 DIAGNOSIS — J4541 Moderate persistent asthma with (acute) exacerbation: Secondary | ICD-10-CM | POA: Insufficient documentation

## 2019-03-21 MED ORDER — MONTELUKAST SODIUM 10 MG PO TABS: 10.0000 mg | ORAL_TABLET | Freq: Every day | ORAL | 3 refills | Status: DC

## 2019-03-21 MED ORDER — BUDESONIDE-FORMOTEROL FUMARATE 160-4.5 MCG/ACT IN AERO: INHALATION_SPRAY | RESPIRATORY_TRACT | 3 refills | Status: DC

## 2019-03-21 MED ORDER — ALBUTEROL SULFATE HFA 108 (90 BASE) MCG/ACT IN AERS: 1.0000 | INHALATION_SPRAY | RESPIRATORY_TRACT | 5 refills | Status: DC | PRN

## 2019-03-21 NOTE — Progress Notes (Signed)
PROBLEMS: Moderate persistent asthma Mild OSA  - intolerant to CPAP   DATA: Sleep study 02/02/16: AHI 11.8/hr with frequent arousals and sleep fragmentation. Spirometry 06/14/13: FEV1 3.85 liters (85% pred), FEV1/FVC 79% Spirometry 02/26/16:  FEV1 3.81 liters (84% pred), FEV1/FVC 81% CPAP compliance 11/05-12/04/17: Set pressure 9 cm H2O. Usage 20/30 days. 17 days > 4 hrs.  CPAP compliance: Set pressure 9 cm H2O. Usage 20/30 days. 17 days > 4 hrs   Virtual Visit via Telephone Note I connected with Antonio Lara on 03/21/19 at 11:15 AM EDT by telephone and verified that I am speaking with the correct person using two identifiers. I discussed the limitations, risks, security and privacy concerns of performing an evaluation and management service by telephone and the availability of in person appointments. I also discussed with the patient that there may be a patient responsible charge related to this service. The patient expressed understanding and agreed to proceed.   INTERVAL HISTORY: Last visit 07/13/18. No major pulmonary events  SUBJ: This is a scheduled reevaluation which was performed remotely due to the coronavirus pandemic.  Overall he has done well since last visit.  However, of late, he has noted some increased SOB which he believes is weather related (hot, humid weather).  However, he has not required any increased use of albuterol.  He does struggle wearing masks sometimes with a feeling of panic.  However, he has been able to deal with this by moving away from people briefly removing his mask to catch his breath.  With regard to sleep apnea, he continues to wear a dental appliance with good control of his snoring and no significant daytime hypersomnolence.  He denies CP, fever, purulent sputum, hemoptysis, LE edema and calf tenderness.  OBJ: There were no vitals filed for this visit.  Due to the remote nature of this encounter, no physical examination could be  performed.    Current Outpatient Medications on File Prior to Visit  Medication Sig Dispense Refill  . amitriptyline (ELAVIL) 25 MG tablet Take 1 tablet (25 mg total) by mouth at bedtime. 30 tablet 0  . budesonide-formoterol (SYMBICORT) 160-4.5 MCG/ACT inhaler INHALE ONE PUFF INTO THE LUNGS TWICE A DAY. RINSE, GARGE, SPIT AFTER USE. 3 Inhaler 2  . esomeprazole (NEXIUM) 20 MG capsule Take 1 capsule (20 mg total) by mouth daily. 90 capsule 1  . GNP ALLERGY & CONGESTION 10-240 MG 24 hr tablet TAKE 1 TABLET BY MOUTH ONCE A DAY 90 tablet 1  . montelukast (SINGULAIR) 10 MG tablet Take 1 tablet (10 mg total) by mouth at bedtime. 90 tablet 1  . Multiple Vitamin (MULTIVITAMIN) tablet Take 1 tablet by mouth daily.    Marland Kitchen PROAIR HFA 108 (90 Base) MCG/ACT inhaler INHALE 2 PUFFS INTO THE LUNGS EVERY 6 HOURS AS NEEDED FOR COUGH 8.5 g 2   No current facility-administered medications on file prior to visit.      DATA: BMP Latest Ref Rng & Units 03/18/2008 03/13/2008  Glucose 70 - 99 mg/dL 94 126(H)  BUN 6 - 23 mg/dL 17 10  Creatinine 0.4 - 1.5 mg/dL 1.2 1.0  Sodium 135 - 145 meq/L 140 140  Potassium 3.5 - 5.1 meq/L 4.1 3.2(L)  Chloride 96 - 112 meq/L 103 102  CO2 19 - 32 meq/L 30 29  Calcium 8.4 - 10.5 mg/dL 9.7 9.3   No flowsheet data found.  CXR: NNF  IMPRESSION:   ICD-10-CM   1. Moderate persistent asthma without complication  G40.10  2. Mild obstructive sleep apnea  G47.33   3. CPAP intolerance.  Snoring well-controlled with dental appliance  Z78.9     PLAN: Continue Symbicort 160-4.5, 2 actuations daily.  Rinse mouth after use.  Prescription refilled  Continue montelukast, 10 mg daily at bedtime.  Prescription refilled  Continue albuterol inhaler as needed for increased shortness of breath, wheezing, chest tightness, cough.  Prescription refilled  Follow-up in 6 months with Dr. Belia HemanKasa (after my departure).  Call sooner if needed   Billy Fischeravid Mecca Guitron, MD PCCM service Mobile  [phone] Pager [phone] 03/21/2019 11:23 AM

## 2019-03-21 NOTE — Patient Instructions (Signed)
Continue Symbicort 160-4.5, 2 actuations daily.  Rinse mouth after use.  Prescription refilled  Continue montelukast, 10 mg daily at bedtime.  Prescription refilled  Continue albuterol inhaler as needed for increased shortness of breath, wheezing, chest tightness, cough.  Prescription refilled  Follow-up in 6 months with Dr. Mortimer Fries (after my departure).  Call sooner if needed

## 2019-05-18 ENCOUNTER — Other Ambulatory Visit: Payer: Self-pay

## 2019-05-18 DIAGNOSIS — Z20822 Contact with and (suspected) exposure to covid-19: Secondary | ICD-10-CM

## 2019-05-19 LAB — NOVEL CORONAVIRUS, NAA: SARS-CoV-2, NAA: NOT DETECTED

## 2019-05-31 ENCOUNTER — Other Ambulatory Visit: Payer: Self-pay | Admitting: *Deleted

## 2019-06-12 ENCOUNTER — Telehealth: Payer: Self-pay | Admitting: *Deleted

## 2019-06-12 ENCOUNTER — Encounter: Payer: Self-pay | Admitting: Family Medicine

## 2019-06-12 MED ORDER — TIZANIDINE HCL 4 MG PO TABS: 4.0000 mg | ORAL_TABLET | Freq: Every evening | ORAL | 2 refills | Status: DC | PRN

## 2019-06-12 MED ORDER — ESOMEPRAZOLE MAGNESIUM 20 MG PO CPDR: 20.0000 mg | DELAYED_RELEASE_CAPSULE | Freq: Every day | ORAL | 0 refills | Status: DC

## 2019-06-12 NOTE — Telephone Encounter (Signed)
Patient scheduled cpx on 07/15/19 and labs the week before.

## 2019-06-12 NOTE — Telephone Encounter (Signed)
Left message asking pt to call office  Start looking @ 11/19 or  after

## 2019-06-12 NOTE — Telephone Encounter (Signed)
Please schedule Antonio Lara for a CPE with fasting labs prior with Dr. Lorelei Pont.

## 2019-06-18 DIAGNOSIS — M9901 Segmental and somatic dysfunction of cervical region: Secondary | ICD-10-CM | POA: Diagnosis not present

## 2019-06-18 DIAGNOSIS — M9903 Segmental and somatic dysfunction of lumbar region: Secondary | ICD-10-CM | POA: Diagnosis not present

## 2019-06-18 DIAGNOSIS — M7918 Myalgia, other site: Secondary | ICD-10-CM | POA: Diagnosis not present

## 2019-07-08 ENCOUNTER — Encounter: Payer: Self-pay | Admitting: Family Medicine

## 2019-07-09 ENCOUNTER — Other Ambulatory Visit (INDEPENDENT_AMBULATORY_CARE_PROVIDER_SITE_OTHER): Payer: BC Managed Care – PPO

## 2019-07-09 DIAGNOSIS — Z0000 Encounter for general adult medical examination without abnormal findings: Secondary | ICD-10-CM | POA: Diagnosis not present

## 2019-07-09 DIAGNOSIS — Z131 Encounter for screening for diabetes mellitus: Secondary | ICD-10-CM | POA: Diagnosis not present

## 2019-07-09 LAB — CBC WITH DIFFERENTIAL/PLATELET
Basophils Absolute: 0.1 10*3/uL (ref 0.0–0.1)
Basophils Relative: 0.8 % (ref 0.0–3.0)
Eosinophils Absolute: 0.2 10*3/uL (ref 0.0–0.7)
Eosinophils Relative: 2.8 % (ref 0.0–5.0)
HCT: 43 % (ref 39.0–52.0)
Hemoglobin: 14.3 g/dL (ref 13.0–17.0)
Lymphocytes Relative: 29.7 % (ref 12.0–46.0)
Lymphs Abs: 1.9 10*3/uL (ref 0.7–4.0)
MCHC: 33.4 g/dL (ref 30.0–36.0)
MCV: 83.6 fl (ref 78.0–100.0)
Monocytes Absolute: 0.6 10*3/uL (ref 0.1–1.0)
Monocytes Relative: 8.8 % (ref 3.0–12.0)
Neutro Abs: 3.7 10*3/uL (ref 1.4–7.7)
Neutrophils Relative %: 57.9 % (ref 43.0–77.0)
Platelets: 247 10*3/uL (ref 150.0–400.0)
RBC: 5.15 Mil/uL (ref 4.22–5.81)
RDW: 13.2 % (ref 11.5–15.5)
WBC: 6.4 10*3/uL (ref 4.0–10.5)

## 2019-07-09 LAB — BASIC METABOLIC PANEL
BUN: 16 mg/dL (ref 6–23)
CO2: 29 mEq/L (ref 19–32)
Calcium: 9.4 mg/dL (ref 8.4–10.5)
Chloride: 103 mEq/L (ref 96–112)
Creatinine, Ser: 0.94 mg/dL (ref 0.40–1.50)
GFR: 88.18 mL/min (ref >60.00)
Glucose, Bld: 112 mg/dL — ABNORMAL HIGH (ref 70–99)
Potassium: 3.9 mEq/L (ref 3.5–5.1)
Sodium: 142 mEq/L (ref 135–145)

## 2019-07-09 LAB — HEPATIC FUNCTION PANEL
ALT: 27 U/L (ref 0–53)
AST: 18 U/L (ref 0–37)
Albumin: 4.8 g/dL (ref 3.5–5.2)
Alkaline Phosphatase: 76 U/L (ref 39–117)
Bilirubin, Direct: 0.1 mg/dL (ref 0.0–0.3)
Total Bilirubin: 0.6 mg/dL (ref 0.2–1.2)
Total Protein: 7 g/dL (ref 6.0–8.3)

## 2019-07-09 LAB — LIPID PANEL
Cholesterol: 221 mg/dL — ABNORMAL HIGH (ref 0–200)
HDL: 48 mg/dL (ref >39.00)
LDL Cholesterol: 156 mg/dL — ABNORMAL HIGH (ref 0–99)
NonHDL: 172.98
Total CHOL/HDL Ratio: 5
Triglycerides: 86 mg/dL (ref 0.0–149.0)
VLDL: 17.2 mg/dL (ref 0.0–40.0)

## 2019-07-09 LAB — HEMOGLOBIN A1C: Hgb A1c MFr Bld: 5.4 % (ref 4.6–6.5)

## 2019-07-15 ENCOUNTER — Other Ambulatory Visit: Payer: Self-pay

## 2019-07-15 ENCOUNTER — Ambulatory Visit (INDEPENDENT_AMBULATORY_CARE_PROVIDER_SITE_OTHER): Payer: BC Managed Care – PPO | Admitting: Family Medicine

## 2019-07-15 ENCOUNTER — Encounter: Payer: Self-pay | Admitting: Family Medicine

## 2019-07-15 VITALS — BP 110/90 | HR 80 | Temp 98.0°F | Ht 72.5 in | Wt 184.2 lb

## 2019-07-15 DIAGNOSIS — Z0000 Encounter for general adult medical examination without abnormal findings: Secondary | ICD-10-CM | POA: Diagnosis not present

## 2019-07-15 DIAGNOSIS — Z23 Encounter for immunization: Secondary | ICD-10-CM | POA: Diagnosis not present

## 2019-07-15 NOTE — Addendum Note (Signed)
Addended by: Carter Kitten on: 07/15/2019 10:04 AM   Modules accepted: Orders

## 2019-07-15 NOTE — Progress Notes (Signed)
Antonio Nida T. Jullianna Gabor, MD Primary Care and Sports Medicine [facility] [address] [place], [id number] Phone: [phone]  FAX: [phone]  Antonio Lara - 43 y.o. male  MRN [id number]  Date of Birth: 1976-05-11  Visit Date: 07/15/2019  PCP: Hannah Beatopland, Maymuna Detzel, MD  Referred by: Hannah Beatopland, Claron Rosencrans, MD  Chief Complaint  Patient presents with  . Annual Exam   Patient Care Team: Hannah Beatopland, Juandaniel Manfredo, MD as PCP - General Subjective:   Antonio Lara is a 43 y.o. pleasant patient who presents with the following:  Preventative Health Maintenance Visit:  Health Maintenance Summary Reviewed and updated, unless pt declines services.  Tobacco History Reviewed. Alcohol: No concerns, no excessive use Exercise Habits: Some activity, rec at least 30 mins 5 times a week STD concerns: no risk or activity to increase risk Drug Use: None Encouraged self-testicular check  Has had in his R ear canal and a crackly on and off for 3 days. Check out R ear.   Tore l pec and some pain in his R now.  Didn't help all that much and R shoulder is feeling better.   Asthma is doing OK Pushing box  Wt Readings from Last 3 Encounters:  07/15/19 184 lb 4 oz (83.6 kg)  02/04/19 188 lb 4 oz (85.4 kg)  12/03/18 184 lb 4 oz (83.6 kg)     Health Maintenance  Topic Date Due  . HIV Screening  11/29/1992  . TETANUS/TDAP  04/01/2019  . INFLUENZA VACCINE  Completed   Immunization History  Administered Date(s) Administered  . Influenza,inj,Quad PF,6+ Mos 07/09/2015, 05/29/2019  . Pneumococcal Polysaccharide-23 07/09/2015  . Td 08/22/1996, 03/31/2009   Patient Active Problem List   Diagnosis Date Noted  . Moderate persistent asthma 03/21/2019  . GERD 07/10/2009  . Allergic rhinitis 04/08/2008   Past Medical History:  Diagnosis Date  . Back pain, chronic    Dr. Annell GreeningMark Yates  . GERD (gastroesophageal reflux disease)   . Mild obstructive sleep apnea,  controlled with dental appliance   . Moderate persistent asthma   . Mononucleosis    in high school   History reviewed. No pertinent surgical history. Social History   Socioeconomic History  . Marital status: Married    Spouse name: Not on file  . Number of children: 2  . Years of education: Not on file  . Highest education level: Not on file  Occupational History  . Occupation: Investment banker, corporateales    Employer: [employer]  Social Needs  . Financial resource strain: Not on file  . Food insecurity    Worry: Not on file    Inability: Not on file  . Transportation needs    Medical: Not on file    Non-medical: Not on file  Tobacco Use  . Smoking status: Never Smoker  . Smokeless tobacco: Never Used  Substance and Sexual Activity  . Alcohol use: Yes    Comment: rarely  . Drug use: No  . Sexual activity: Not on file  Lifestyle  . Physical activity    Days per week: Not on file    Minutes per session: Not on file  . Stress: Not on file  Relationships  . Social Musicianconnections    Talks on phone: Not on file    Gets together: Not on file    Attends religious service: Not on file    Active member of club or organization: Not on file    Attends  meetings of clubs or organizations: Not on file    Relationship status: Not on file  . Intimate partner violence    Fear of current or ex partner: Not on file    Emotionally abused: Not on file    Physically abused: Not on file    Forced sexual activity: Not on file  Other Topics Concern  . Not on file  Social History Narrative   Drinks one caffeinated beverage a day.   Family History  Problem Relation Age of Onset  . Breast cancer Mother   . Stroke Father   . Cancer Maternal Grandfather    Allergies  Allergen Reactions  . Oxycodone-Acetaminophen     REACTION: extemely dizzy with nausea and vomiting.    Medication list has been reviewed and updated.   General: Denies fever, chills, sweats. No significant weight loss. Eyes:  Denies blurring,significant itching ENT: Denies earache, sore throat, and hoarseness. Cardiovascular: Denies chest pains, palpitations, dyspnea on exertion Respiratory: He has been having some difficulty breathing primarily when wearing his mask only. GI: Denies nausea, vomiting, diarrhea, constipation, change in bowel habits, abdominal pain, melena, hematochezia GU: Rare loss of erection. Musculoskeletal: Denies back pain, joint pain Derm: Denies rash, itching Neuro: Denies  paresthesias, frequent falls, frequent headaches Psych: Denies depression, anxiety Endocrine: Denies cold intolerance, heat intolerance, polydipsia Heme: Denies enlarged lymph nodes Allergy: No hayfever  Objective:   BP 110/90   Pulse 80   Temp 98 F (36.7 C) (Temporal)   Ht 6' 0.5" (1.842 m)   Wt 184 lb 4 oz (83.6 kg)   SpO2 99%   BMI 24.65 kg/m  Ideal Body Weight: Weight in (lb) to have BMI = 25: 186.5  Ideal Body Weight: Weight in (lb) to have BMI = 25: 186.5 No exam data present Depression screen [facility] 2/9 07/15/2019  Decreased Interest 0  Down, Depressed, Hopeless 0  PHQ - 2 Score 0     GEN: well developed, well nourished, no acute distress Eyes: conjunctiva and lids normal, PERRLA, EOMI ENT: TM clear on the left, but the right TM has some modest amount of fluid., nares clear, oral exam WNL Neck: supple, no lymphadenopathy, no thyromegaly, no JVD Pulm: clear to auscultation and percussion, respiratory effort normal CV: regular rate and rhythm, S1-S2, no murmur, rub or gallop, no bruits, peripheral pulses normal and symmetric, no cyanosis, clubbing, edema or varicosities GI: soft, non-tender; no hepatosplenomegaly, masses; active bowel sounds all quadrants Lymph: no cervical, axillary or inguinal adenopathy MSK: gait normal, muscle tone and strength WNL, no joint swelling, effusions, discoloration, crepitus  SKIN: clear, good turgor, color WNL, no rashes, lesions, or ulcerations Neuro: normal  mental status, normal strength, sensation, and motion Psych: alert; oriented to person, place and time, normally interactive and not anxious or depressed in appearance.  All labs reviewed with patient. Results for orders placed or performed in visit on 07/09/19  Lipid Profile  Result Value Ref Range   Cholesterol 221 (H) 0 - 200 mg/dL   Triglycerides 86.0 0.0 - 149.0 mg/dL   HDL 48.00 >39.00 mg/dL   VLDL 17.2 0.0 - 40.0 mg/dL   LDL Cholesterol 156 (H) 0 - 99 mg/dL   Total CHOL/HDL Ratio 5    NonHDL 172.98   Liver Profile  Result Value Ref Range   Total Bilirubin 0.6 0.2 - 1.2 mg/dL   Bilirubin, Direct 0.1 0.0 - 0.3 mg/dL   Alkaline Phosphatase 76 39 - 117 U/L   AST 18 0 -  37 U/L   ALT 27 0 - 53 U/L   Total Protein 7.0 6.0 - 8.3 g/dL   Albumin 4.8 3.5 - 5.2 g/dL  Basic metabolic panel  Result Value Ref Range   Sodium 142 135 - 145 mEq/L   Potassium 3.9 3.5 - 5.1 mEq/L   Chloride 103 96 - 112 mEq/L   CO2 29 19 - 32 mEq/L   Glucose, Bld 112 (H) 70 - 99 mg/dL   BUN 16 6 - 23 mg/dL   Creatinine, Ser 6.76 0.40 - 1.50 mg/dL   GFR 72.09 >47.09 mL/min   Calcium 9.4 8.4 - 10.5 mg/dL  CBC with Differential/Platelet  Result Value Ref Range   WBC 6.4 4.0 - 10.5 K/uL   RBC 5.15 4.22 - 5.81 Mil/uL   Hemoglobin 14.3 13.0 - 17.0 g/dL   HCT 62.8 36.6 - 29.4 %   MCV 83.6 78.0 - 100.0 fl   MCHC 33.4 30.0 - 36.0 g/dL   RDW 76.5 46.5 - 03.5 %   Platelets 247.0 150.0 - 400.0 K/uL   Neutrophils Relative % 57.9 43.0 - 77.0 %   Lymphocytes Relative 29.7 12.0 - 46.0 %   Monocytes Relative 8.8 3.0 - 12.0 %   Eosinophils Relative 2.8 0.0 - 5.0 %   Basophils Relative 0.8 0.0 - 3.0 %   Neutro Abs 3.7 1.4 - 7.7 K/uL   Lymphs Abs 1.9 0.7 - 4.0 K/uL   Monocytes Absolute 0.6 0.1 - 1.0 K/uL   Eosinophils Absolute 0.2 0.0 - 0.7 K/uL   Basophils Absolute 0.1 0.0 - 0.1 K/uL  Hemoglobin A1c  Result Value Ref Range   Hgb A1c MFr Bld 5.4 4.6 - 6.5 %    Assessment and Plan:     ICD-10-CM   1.  Healthcare maintenance  Z00.00    Globally doing okay. He is compliant with taking his asthma medicine has had some difficulties with wearing his mask, but generally okay.  He does have some minor musculoskeletal complaints, but these are stable and he has had some longstanding minor issues with his neck and back.  Recommended more physical activity if at all possible.  Health Maintenance Exam: The patient's preventative maintenance and recommended screening tests for an annual wellness exam were reviewed in full today. Brought up to date unless services declined.  Counselled on the importance of diet, exercise, and its role in overall health and mortality. The patient's FH and SH was reviewed, including their home life, tobacco status, and drug and alcohol status.  Follow-up in 1 year for physical exam or additional follow-up below.  Follow-up: No follow-ups on file. Or follow-up in 1 year if not noted.  No orders of the defined types were placed in this encounter.  There are no discontinued medications. No orders of the defined types were placed in this encounter.   Signed,  Elpidio Galea. Brooklin Rieger, MD   Allergies as of 07/15/2019      Reactions   Oxycodone-acetaminophen    REACTION: extemely dizzy with nausea and vomiting.      Medication List       Accurate as of July 15, 2019  9:21 AM. If you have any questions, ask your nurse or doctor.        albuterol 108 (90 Base) MCG/ACT inhaler Commonly known as: VENTOLIN HFA Inhale 1-2 puffs into the lungs every 4 (four) hours as needed for wheezing or shortness of breath.   budesonide-formoterol 160-4.5 MCG/ACT inhaler Commonly known as: Symbicort INHALE  ONE PUFF INTO THE LUNGS TWICE A DAY. RINSE, GARGE, SPIT AFTER USE.   esomeprazole 20 MG capsule Commonly known as: NEXIUM Take 1 capsule (20 mg total) by mouth daily.   GNP Allergy & Congestion 10-240 MG 24 hr tablet Generic drug: loratadine-pseudoephedrine TAKE 1  TABLET BY MOUTH ONCE A DAY   montelukast 10 MG tablet Commonly known as: SINGULAIR Take 1 tablet (10 mg total) by mouth at bedtime.   multivitamin tablet Take 1 tablet by mouth daily.   tiZANidine 4 MG tablet Commonly known as: ZANAFLEX Take 1 tablet (4 mg total) by mouth at bedtime as needed for muscle spasms.

## 2019-07-30 ENCOUNTER — Encounter: Payer: Self-pay | Admitting: Internal Medicine

## 2019-07-30 ENCOUNTER — Ambulatory Visit (INDEPENDENT_AMBULATORY_CARE_PROVIDER_SITE_OTHER): Payer: BC Managed Care – PPO | Admitting: Internal Medicine

## 2019-07-30 ENCOUNTER — Other Ambulatory Visit: Payer: Self-pay

## 2019-07-30 DIAGNOSIS — M9901 Segmental and somatic dysfunction of cervical region: Secondary | ICD-10-CM | POA: Diagnosis not present

## 2019-07-30 DIAGNOSIS — J4520 Mild intermittent asthma, uncomplicated: Secondary | ICD-10-CM

## 2019-07-30 DIAGNOSIS — M9903 Segmental and somatic dysfunction of lumbar region: Secondary | ICD-10-CM | POA: Diagnosis not present

## 2019-07-30 DIAGNOSIS — G4733 Obstructive sleep apnea (adult) (pediatric): Secondary | ICD-10-CM | POA: Diagnosis not present

## 2019-07-30 DIAGNOSIS — M7918 Myalgia, other site: Secondary | ICD-10-CM | POA: Diagnosis not present

## 2019-07-30 NOTE — Progress Notes (Signed)
PROBLEMS/synopsis Moderate persistent asthma Mild OSA  - intolerant to CPAP   DATA: Sleep study 02/02/16: AHI 11.8/hr with frequent arousals and sleep fragmentation. Spirometry 06/14/13: FEV1 3.85 liters (85% pred), FEV1/FVC 79% Spirometry 02/26/16:  FEV1 3.81 liters (84% pred), FEV1/FVC 81% CPAP compliance 11/05-12/04/17: Set pressure 9 cm H2O. Usage 20/30 days. 17 days > 4 hrs.  CPAP compliance: Set pressure 9 cm H2O. Usage 20/30 days. 17 days > 4 hrs   I connected with the patient by telephone enabled telemedicine visit and verified that I am speaking with the correct person using two identifiers.    I discussed the limitations, risks, security and privacy concerns of performing an evaluation and management service by telemedicine and the availability of in-person appointments. I also discussed with the patient that there may be a patient responsible charge related to this service. The patient expressed understanding and agreed to proceed.  PATIENT AGREES AND CONFIRMS -YES   Other persons participating in the visit and their role in the encounter: Patient, nursing  This visit type was conducted due to national recommendations for restrictions regarding the COVID-19 Pandemic (e.g. social distancing).  This format is felt to be most appropriate for this patient at this time.  All issues noted in this document were discussed and addressed.        INTERVAL HISTORY: Last visit 07/13/18. No major pulmonary events  CC Follow-up asthma Follow-up obstructive sleep apnea  HPI  Asthma seems to be well-controlled at this time uses albuterol infrequently No  exacerbation at this time No evidence of heart failure at this time No evidence or signs of infection at this time No respiratory distress No fevers, chills, nausea, vomiting, diarrhea No evidence of lower extremity edema No evidence hemoptysis  Diagnosis of sleep apnea He continues to wear a dental appliance with good control of  his snoring There is no significant daytime hypersomnolence at this time  AVOID TRIGGERS-COLD AIR Weather, exercise  GERD-on nexium  Had used ALBUTEROL prior to exercise   OBJ: There were no vitals filed for this visit.    Current Outpatient Medications on File Prior to Visit  Medication Sig Dispense Refill  . albuterol (VENTOLIN HFA) 108 (90 Base) MCG/ACT inhaler Inhale 1-2 puffs into the lungs every 4 (four) hours as needed for wheezing or shortness of breath. 18 g 5  . budesonide-formoterol (SYMBICORT) 160-4.5 MCG/ACT inhaler INHALE ONE PUFF INTO THE LUNGS TWICE A DAY. RINSE, GARGE, SPIT AFTER USE. 3 Inhaler 3  . esomeprazole (NEXIUM) 20 MG capsule Take 1 capsule (20 mg total) by mouth daily. 90 capsule 0  . GNP ALLERGY & CONGESTION 10-240 MG 24 hr tablet TAKE 1 TABLET BY MOUTH ONCE A DAY 90 tablet 1  . montelukast (SINGULAIR) 10 MG tablet Take 1 tablet (10 mg total) by mouth at bedtime. 90 tablet 3  . Multiple Vitamin (MULTIVITAMIN) tablet Take 1 tablet by mouth daily.    Marland Kitchen tiZANidine (ZANAFLEX) 4 MG tablet Take 1 tablet (4 mg total) by mouth at bedtime as needed for muscle spasms. 30 tablet 2   No current facility-administered medications on file prior to visit.      DATA: BMP Latest Ref Rng & Units 07/09/2019 03/18/2008 03/13/2008  Glucose 70 - 99 mg/dL 112(H) 94 126(H)  BUN 6 - 23 mg/dL 16 17 10   Creatinine 0.40 - 1.50 mg/dL 0.94 1.2 1.0  Sodium 135 - 145 mEq/L 142 140 140  Potassium 3.5 - 5.1 mEq/L 3.9 4.1 3.2(L)  Chloride 96 -  112 mEq/L 103 103 102  CO2 19 - 32 mEq/L 29 30 29   Calcium 8.4 - 10.5 mg/dL 9.4 9.7 9.3   CBC Latest Ref Rng & Units 07/09/2019  WBC 4.0 - 10.5 K/uL 6.4  Hemoglobin 13.0 - 17.0 g/dL 07/11/2019  Hematocrit 09.9 - 52.0 % 43.0  Platelets 150.0 - 400.0 K/uL 247.0    CXR: NNF  Assessment and plan 43 year old pleasant white male with a diagnosis of mild intermittent asthma which is well controlled with avoidance of triggers and uses albuterol as  needed along with Symbicort in the setting of obstructive sleep apnea   Mild intermittent asthma Seems to be controlled at this time with Symbicort Avoid triggers Rinse mouth out after use Continue montelukast Albuterol as needed  Obstructive sleep apnea Patient continues to wear dental device This seems to be helping his symptoms   COVID-19 EDUCATION: The signs and symptoms of COVID-19 were discussed with the patient and how to seek care for testing.  The importance of social distancing was discussed today. Hand Washing Techniques and avoid touching face was advised.   MEDICATION ADJUSTMENTS/LABS AND TESTS ORDERED: FLU SHOT UP TP DATE 05/2019 Continue inhalers as prescribed AVOID TRIGGERS CAN USE UP TO 4 PUFFS of ALBUTEROL PRIOR TO EXERCISE  CURRENT MEDICATIONS REVIEWED AT LENGTH WITH PATIENT TODAY   Patient satisfied with Plan of action and management. All questions answered  Follow up in 6 months Total Time Spent 25 mins   06/2019 Wallis Bamberg, M.D.  [facility]  Medical Director [facility] Medical Director [facility] Cardio-Pulmonary Department

## 2019-07-30 NOTE — Patient Instructions (Signed)
MEDICATION ADJUSTMENTS/LABS AND TESTS ORDERED: FLU SHOT UP TP DATE 05/2019 Continue inhalers as prescribed AVOID TRIGGERS CAN USE UP TO 4 PUFFS of ALBUTEROL PRIOR TO EXERCISE

## 2019-08-28 DIAGNOSIS — M62830 Muscle spasm of back: Secondary | ICD-10-CM | POA: Diagnosis not present

## 2019-08-28 DIAGNOSIS — M5442 Lumbago with sciatica, left side: Secondary | ICD-10-CM | POA: Diagnosis not present

## 2019-08-28 DIAGNOSIS — M5441 Lumbago with sciatica, right side: Secondary | ICD-10-CM | POA: Diagnosis not present

## 2019-08-28 DIAGNOSIS — M5127 Other intervertebral disc displacement, lumbosacral region: Secondary | ICD-10-CM | POA: Diagnosis not present

## 2019-09-02 ENCOUNTER — Telehealth: Payer: Self-pay

## 2019-09-02 NOTE — Telephone Encounter (Signed)
Antonio Lara at front desk said pt had scheduled a my chart appt with Antonio Lara on 09/04/19 at 9 am. 2 days ago pts back pain worsened and pt went to chiropractor. Pt having lower lt back pain that radiates to testicles and abdomen. Pt having difficulty in standing and walking. I offered pt a sooner appt with a different provider but pt wants Antonio Lara to handle this problem. UC & ED precautions given and pt voiced understanding. FYI to Antonio Lara.

## 2019-09-04 ENCOUNTER — Encounter: Payer: Self-pay | Admitting: Family Medicine

## 2019-09-04 ENCOUNTER — Other Ambulatory Visit: Payer: Self-pay

## 2019-09-04 ENCOUNTER — Ambulatory Visit: Payer: BC Managed Care – PPO | Admitting: Family Medicine

## 2019-09-04 VITALS — BP 112/80 | HR 65 | Temp 97.6°F | Ht 72.5 in | Wt 186.2 lb

## 2019-09-04 DIAGNOSIS — M5416 Radiculopathy, lumbar region: Secondary | ICD-10-CM | POA: Diagnosis not present

## 2019-09-04 MED ORDER — PREDNISONE 20 MG PO TABS: ORAL_TABLET | ORAL | 0 refills | Status: DC

## 2019-09-04 NOTE — Progress Notes (Signed)
Antonio Triggs T. Milany Geck, MD Primary Care and [place] at [facility] [place], [address] Phone: [phone]  FAX: [name]  MRN [id number]  Date of Birth: [birth year]  Visit Date: 09/04/2019  PCP: Owens Loffler, MD  Referred by: Owens Loffler, MD  Chief Complaint  Patient presents with  . Back Pain    This visit occurred during the SARS-CoV-2 public health emergency.  Safety protocols were in place, including screening questions prior to the visit, additional usage of staff PPE, and extensive cleaning of exam room while observing appropriate contact time as indicated for disinfecting solutions.   Subjective:  He is a 44 y.o. very pleasant male patient with Body mass index is 24.91 kg/m. who presents with the following:  He does have a history of lumbar radiculopathy in the past.  Tight LB.  Takes two alleve every morning with breakfast.  Was doing pretty well and got up top and had to turn and it was excruciating, Le and down to the groin.  Mostly on the L >> R.  The next day after that, really was the same day.  Was walking and had lost of strength in his knees. Loss of str in both legs. The time frame was about Monday through sat, Friday was when it got a lot better, but str is better.   He is having such pain that he is having difficulty sometimes rising from a seated position.  Upper l shin?  Past Medical History, Surgical History, Social History, Family History, Problem List, Medications, and Allergies have been reviewed and updated if relevant.  GEN: no acute illness or fever CV: No chest pain or shortness of breath MSK: detailed above Neuro: neurological signs are described above ROS O/w per HPI  Objective:   BP 112/80   Pulse 65   Temp 97.6 F (36.4 C) (Temporal)   Ht 6' 0.5" (1.842 m)   Wt 186 lb 4 oz (84.5 kg)   SpO2 99%   BMI 24.91 kg/m    GEN:  Well-developed,well-nourished,in no acute distress; alert,appropriate and cooperative throughout examination HEENT: Normocephalic and atraumatic without obvious abnormalities. Ears, externally no deformities PULM: Breathing comfortably in no respiratory distress EXT: No clubbing, cyanosis, or edema PSYCH: Normally interactive. Cooperative during the interview. Pleasant. Friendly and conversant. Not anxious or depressed appearing. Normal, full affect.  Range of motion at  the waist: Flexion, extension, lateral bending and rotation: full   No echymosis or edema Rises to examination table with mild difficulty Gait: minimally antalgic  Inspection/Deformity: N Paraspinus Tenderness: l2-s1 L>>R  B Ankle Dorsiflexion (L5,4): 5/5 B Great Toe Dorsiflexion (L5,4): 5/5 Heel Walk (L5): WNL Toe Walk (S1): WNL Rise/Squat (L4): WNL, mild pain  SENSORY Light Touch: WNL Pinprick: upper anterior shin with decreased sensation  REFLEXES Knee (L4): 2+ Ankle (S1): 2+  B SLR, seated: neg B SLR, supine: neg B FABER: neg B Reverse FABER: neg B Greater Troch: NT B Log Roll: neg B Stork: NT B Sciatic Notch: NT   Radiology: CLINICAL DATA:  Lumbar pain  EXAM: LUMBAR SPINE - COMPLETE 4+ VIEW  COMPARISON:  09/23/2014  FINDINGS: Five lumbar type vertebral bodies are well visualized. Vertebral body height is well maintained. No pars defects are seen. No anterolisthesis is noted. Mild disc space narrowing at L5-S1 is seen. No soft tissue abnormality is noted.  IMPRESSION: No acute abnormality noted.  Mild disc space narrowing  is seen.   Electronically Signed   By: Alcide Clever M.D.   On: 12/03/2018 19:52  Assessment and Plan:     ICD-10-CM   1. Lumbar radiculopathy, acute  M54.16    Level of Medical Decision-Making in this case is Moderate.   L > R lumbar radiculopathy.  Prior MRI in 2016 relatively benign. Some anterior shin weakness to pinprick but str intact.  Pulse  steroids with f/u.  Follow-up: Return in about 3 weeks (around 09/25/2019).  Meds ordered this encounter  Medications  . predniSONE (DELTASONE) 20 MG tablet    Sig: 2 tabs po for 7 days, then 1 tab po for 7 days    Dispense:  21 tablet    Refill:  0   No orders of the defined types were placed in this encounter.   Signed,  Elpidio Galea. Nyaire Denbleyker, MD   Outpatient Encounter Medications as of 09/04/2019  Medication Sig  . albuterol (VENTOLIN HFA) 108 (90 Base) MCG/ACT inhaler Inhale 1-2 puffs into the lungs every 4 (four) hours as needed for wheezing or shortness of breath.  . budesonide-formoterol (SYMBICORT) 160-4.5 MCG/ACT inhaler INHALE ONE PUFF INTO THE LUNGS TWICE A DAY. RINSE, GARGE, SPIT AFTER USE.  Marland Kitchen esomeprazole (NEXIUM) 20 MG capsule Take 1 capsule (20 mg total) by mouth daily.  . GNP ALLERGY & CONGESTION 10-240 MG 24 hr tablet TAKE 1 TABLET BY MOUTH ONCE A DAY  . montelukast (SINGULAIR) 10 MG tablet Take 1 tablet (10 mg total) by mouth at bedtime.  . Multiple Vitamin (MULTIVITAMIN) tablet Take 1 tablet by mouth daily.  Marland Kitchen tiZANidine (ZANAFLEX) 4 MG tablet Take 1 tablet (4 mg total) by mouth at bedtime as needed for muscle spasms.  . predniSONE (DELTASONE) 20 MG tablet 2 tabs po for 7 days, then 1 tab po for 7 days   No facility-administered encounter medications on file as of 09/04/2019.

## 2019-09-10 ENCOUNTER — Encounter: Payer: Self-pay | Admitting: Family Medicine

## 2019-09-10 ENCOUNTER — Other Ambulatory Visit: Payer: Self-pay | Admitting: Family Medicine

## 2019-09-10 ENCOUNTER — Ambulatory Visit (INDEPENDENT_AMBULATORY_CARE_PROVIDER_SITE_OTHER): Payer: BC Managed Care – PPO | Admitting: Family Medicine

## 2019-09-10 VITALS — Ht 72.5 in

## 2019-09-10 DIAGNOSIS — J4541 Moderate persistent asthma with (acute) exacerbation: Secondary | ICD-10-CM

## 2019-09-10 NOTE — Progress Notes (Signed)
VIRTUAL VISIT Due to national recommendations of social distancing due to COVID 19, a virtual visit is felt to be most appropriate for this patient at this time.   I connected with the patient on 09/10/19 at  3:20 PM EST by virtual telehealth platform and verified that I am speaking with the correct person using two identifiers.   I discussed the limitations, risks, security and privacy concerns of performing an evaluation and management service by  virtual telehealth platform and the availability of in person appointments. I also discussed with the patient that there may be a patient responsible charge related to this service. The patient expressed understanding and agreed to proceed.  Patient location: Home Provider Location: [facility] Participants: Kerby Nora and Lionel December   Chief Complaint  Patient presents with  . Post Nasal Drip  . Cough    Wet  . Nasal Congestion  . Tight Throat  . Shortness of Breath    History of Present Illness:  44 year old male pt of Dr. Cyndie Chime with history of moderate persistent asthma and allergic rhinitis presents with new onset postnasal drip, cough, productive, nasal congestion ( clear to multicolored) and irritated throat x 4 days.   Bilateral ear fullness, no ear pain.  Sinus pressure no pain.   Wife has been sick. Had negative COVID test.   He also reports SOB and occ wheeze but not severe.   He is using albuterol prn ( using  Once daily), Symbicort daily and Singulair.  He saw PCP on 1/13 for lumbar radiculopathy and was started on 14 day prednisone taper.     COVID 19 screen No recent travel or known exposure to COVID19   The importance of social distancing was discussed today.   Review of Systems  Constitutional: Negative for chills and fever.  HENT: Positive for congestion and sinus pain. Negative for ear pain.   Eyes: Negative for pain and redness.  Respiratory: Negative for cough and shortness of breath.    Cardiovascular: Negative for chest pain, palpitations and leg swelling.  Gastrointestinal: Negative for abdominal pain, blood in stool, constipation, diarrhea, nausea and vomiting.  Genitourinary: Negative for dysuria.  Musculoskeletal: Negative for falls and myalgias.  Skin: Negative for rash.  Neurological: Negative for dizziness.  Psychiatric/Behavioral: Negative for depression. The patient is not nervous/anxious.       Past Medical History:  Diagnosis Date  . Back pain, chronic    Dr. Annell Greening  . GERD (gastroesophageal reflux disease)   . Mild obstructive sleep apnea, controlled with dental appliance   . Moderate persistent asthma   . Mononucleosis    in high school    reports that he has never smoked. He has never used smokeless tobacco. He reports current alcohol use. He reports that he does not use drugs.   Current Outpatient Medications:  .  albuterol (VENTOLIN HFA) 108 (90 Base) MCG/ACT inhaler, Inhale 1-2 puffs into the lungs every 4 (four) hours as needed for wheezing or shortness of breath., Disp: 18 g, Rfl: 5 .  budesonide-formoterol (SYMBICORT) 160-4.5 MCG/ACT inhaler, INHALE ONE PUFF INTO THE LUNGS TWICE A DAY. RINSE, GARGE, SPIT AFTER USE., Disp: 3 Inhaler, Rfl: 3 .  esomeprazole (NEXIUM) 20 MG capsule, TAKE 1 CAPSULE BY MOUTH ONCE DAILY, Disp: 90 capsule, Rfl: 3 .  GNP ALLERGY & CONGESTION 10-240 MG 24 hr tablet, TAKE 1 TABLET BY MOUTH ONCE A DAY, Disp: 90 tablet, Rfl: 1 .  montelukast (SINGULAIR) 10 MG tablet,  Take 1 tablet (10 mg total) by mouth at bedtime., Disp: 90 tablet, Rfl: 3 .  Multiple Vitamin (MULTIVITAMIN) tablet, Take 1 tablet by mouth daily., Disp: , Rfl:  .  predniSONE (DELTASONE) 20 MG tablet, 2 tabs po for 7 days, then 1 tab po for 7 days, Disp: 21 tablet, Rfl: 0 .  tiZANidine (ZANAFLEX) 4 MG tablet, Take 1 tablet (4 mg total) by mouth at bedtime as needed for muscle spasms., Disp: 30 tablet, Rfl: 2   Observations/Objective: Height 6' 0.5" (1.842  m).  Physical Exam  Physical Exam Constitutional:      General: The patient is not in acute distress. Speaking in complete sentences Pulmonary:     Effort: Pulmonary effort is normal. No respiratory distress.  Neurological:     Mental Status: The patient is alert and oriented to person, place, and time.  Psychiatric:        Mood and Affect: Mood normal.        Behavior: Behavior normal.   Assessment and Plan   Moderate persistent asthma with exacerbation  Likely COVID19  infection vs other viral infection. No clear sign of bacterial infection at this time.  Recommend testing .. info on how to obtain testing provided through [facility]. No SOB.  No red flags/need for ER visit or in-person exam at respiratory clinic at this time..    Pt moderate risk for COVID complications given male and asthma history. If SOB begins symptoms worsening.. have low threshold for in-person exam, if severe shortness of breath ER visit recommended.  Can monitor Oxygen saturation at home with home monitor if able to obtain.  Go to ER if O2 sat < 90% on room air.  Reviewed home care and provided information through [facility].   Already on prednisone taper for back .. continue asthma meds, albuterol prn.  Start mucolytic and nasal saline irrigation. Recommended isolation until test returns. If returns positive 10 days quarantine recommended.  Provided info about prevention of spread of COVID 19.   I discussed the assessment and treatment plan with the patient. The patient was provided an opportunity to ask questions and all were answered. The patient agreed with the plan and demonstrated an understanding of the instructions.   The patient was advised to call back or seek an in-person evaluation if the symptoms worsen or if the condition fails to improve as anticipated.     Eliezer Lofts, MD

## 2019-09-10 NOTE — Patient Instructions (Addendum)
Start nasal saline spray,  Mucinex DM.  Use albuterol as needed. Continue prednisone taper.  Continue singulair and symbicort. Can monitor Oxygen saturation at home with home monitor if able to obtain ( from pharmacy or [facility]).  Go to ER if O2 sat < 90% on room air.    How to make an appointment for COVID testing  TEXT: COVID to [phone]  or CALL: [phone] or  ONLINE: [email or web address]  Locations:  [place]: [facility] [address] Geophysicist/field seismologist [place]: [address] at [employer]  M-F  8 AM to 3:30 PM

## 2019-09-10 NOTE — Progress Notes (Signed)
Work note sent as instructed by Dr. Bedsole. 

## 2019-09-10 NOTE — Assessment & Plan Note (Signed)
Likely COVID19  infection vs other viral infection. No clear sign of bacterial infection at this time.  Recommend testing .. info on how to obtain testing provided through MyChart. No SOB.  No red flags/need for ER visit or in-person exam at respiratory clinic at this time..    Pt moderate risk for COVID complications given male and asthma history. If SOB begins symptoms worsening.. have low threshold for in-person exam, if severe shortness of breath ER visit recommended.  Can monitor Oxygen saturation at home with home monitor if able to obtain.  Go to ER if O2 sat < 90% on room air.  Reviewed home care and provided information through MyChart.   Already on prednisone taper for back .. continue asthma meds, albuterol prn.  Start mucolytic and nasal saline irrigation. Recommended isolation until test returns. If returns positive 10 days quarantine recommended.  Provided info about prevention of spread of COVID 19.

## 2019-09-11 ENCOUNTER — Ambulatory Visit: Payer: BC Managed Care – PPO | Attending: Internal Medicine

## 2019-09-11 DIAGNOSIS — Z20822 Contact with and (suspected) exposure to covid-19: Secondary | ICD-10-CM | POA: Diagnosis not present

## 2019-09-12 ENCOUNTER — Other Ambulatory Visit: Payer: BC Managed Care – PPO

## 2019-09-13 LAB — NOVEL CORONAVIRUS, NAA: SARS-CoV-2, NAA: NOT DETECTED

## 2019-09-29 NOTE — Progress Notes (Signed)
Frederico Hamman T. Derrick Orris, MD Primary Care and [place] at [facility] [place], [address] Phone: [phone]  FAX: [place] - 44 y.o. male  MRN [id number]  Date of Birth: December 14, 1975  Visit Date: 09/30/2019  PCP: Owens Loffler, MD  Referred by: Owens Loffler, MD  Chief Complaint  Patient presents with  . Follow-up    Back Issues  . Erectile Dysfunction    This visit occurred during the SARS-CoV-2 public health emergency.  Safety protocols were in place, including screening questions prior to the visit, additional usage of staff PPE, and extensive cleaning of exam room while observing appropriate contact time as indicated for disinfecting solutions.   Subjective:   Antonio Lara is a 44 y.o. very pleasant male patient who presents with the following: Back Pain  I saw him about 3 weeks ago with a recurrence of his lumbar radiculopathy.  At that point I gave him 14 days of prednisone and wanted him to follow back up with me in the office.  ongoing for approximately: Greater than 6 weeks. The patient has had back pain before. The back pain is localized into the lumbar spine area. They also describe positive radiculopathy.  He is really doing quite a bit better.  His back is not having any radicular symptoms and his pain is much better.  He also wanted to talk about some ED meds.   Back has been in and out.   No back program. Give a HEP.  Erectile dysfunction.  He is having this intermittently and progressively for about 1 year. Desire is there.  With back.  For the last few weeks.  Not keeping erections well.  ? Some pain rarely to ejac. Longer to get hard.   Back No numbness or tingling. No bowel or bladder incontinence. No focal weakness. Prior interventions: None Physical therapy: No Chiropractic manipulations: No Acupuncture: No Osteopathic manipulation: No Heat or cold:  Minimal effect  Review of Systems is noted in the HPI, as appropriate  Objective:   Blood pressure 110/80, pulse 83, temperature 98 F (36.7 C), temperature source Temporal, height 6' 0.5" (1.842 m), weight 182 lb 4 oz (82.7 kg), SpO2 98 %.  GEN: No acute distress; alert,appropriate. PULM: Breathing comfortably in no respiratory distress PSYCH: Normally interactive.   Range of motion at  the waist: Flexion, rotation and lateral bending: Full  No echymosis or edema Rises to examination table with no difficulty Gait: minimally antalgic  Inspection/Deformity: No abnormality Paraspinus T: Nontender  B Ankle Dorsiflexion (L5,4): 5/5 B Great Toe Dorsiflexion (L5,4): 5/5 Heel Walk (L5): WNL Toe Walk (S1): WNL Rise/Squat (L4): WNL, mild pain  SENSORY B Medial Foot (L4): WNL B Dorsum (L5): WNL B Lateral (S1): WNL Light Touch: WNL Pinprick: WNL  REFLEXES Knee (L4): 2+ Ankle (S1): 2+  B SLR, seated: neg B Sciatic Notch: NT  Radiology: DG Lumbar Spine Complete  Result Date: 12/03/2018 CLINICAL DATA:  Lumbar pain EXAM: LUMBAR SPINE - COMPLETE 4+ VIEW COMPARISON:  09/23/2014 FINDINGS: Five lumbar type vertebral bodies are well visualized. Vertebral body height is well maintained. No pars defects are seen. No anterolisthesis is noted. Mild disc space narrowing at L5-S1 is seen. No soft tissue abnormality is noted. IMPRESSION: No acute abnormality noted.  Mild disc space narrowing is seen. Electronically Signed   By: Inez Catalina M.D.   On: 12/03/2018 19:52   Assessment and Plan:  ICD-10-CM   1. Lumbar radiculopathy, acute  M54.16   2. Other male erectile dysfunction  N52.8 Testos,Total,Free and SHBG (Male)  3. Other fatigue  R53.83 Testos,Total,Free and SHBG (Male)    Level of Medical Decision-Making in this case is MODERATE.  Back pain is considerably improved.  New onset erection dysfunction.  I am going to check a testosterone today, 1 week good we will go ahead  and give him some Viagra tablets.  Follow-up: No follow-ups on file.  Meds ordered this encounter  Medications  . sildenafil (VIAGRA) 100 MG tablet    Sig: Take 0.5-1 tablets (50-100 mg total) by mouth daily as needed for erectile dysfunction.    Dispense:  10 tablet    Refill:  11   Medications Discontinued During This Encounter  Medication Reason  . predniSONE (DELTASONE) 20 MG tablet Completed Course   Orders Placed This Encounter  Procedures  . Testos,Total,Free and SHBG (Male)    Signed,  Karleen Hampshire T. Anae Hams, MD   Outpatient Encounter Medications as of 09/30/2019  Medication Sig  . albuterol (VENTOLIN HFA) 108 (90 Base) MCG/ACT inhaler Inhale 1-2 puffs into the lungs every 4 (four) hours as needed for wheezing or shortness of breath.  . budesonide-formoterol (SYMBICORT) 160-4.5 MCG/ACT inhaler INHALE ONE PUFF INTO THE LUNGS TWICE A DAY. RINSE, GARGE, SPIT AFTER USE.  Marland Kitchen esomeprazole (NEXIUM) 20 MG capsule TAKE 1 CAPSULE BY MOUTH ONCE DAILY  . GNP ALLERGY & CONGESTION 10-240 MG 24 hr tablet TAKE 1 TABLET BY MOUTH ONCE A DAY  . montelukast (SINGULAIR) 10 MG tablet Take 1 tablet (10 mg total) by mouth at bedtime.  . Multiple Vitamin (MULTIVITAMIN) tablet Take 1 tablet by mouth daily.  . sildenafil (VIAGRA) 100 MG tablet Take 0.5-1 tablets (50-100 mg total) by mouth daily as needed for erectile dysfunction.  . [DISCONTINUED] predniSONE (DELTASONE) 20 MG tablet 2 tabs po for 7 days, then 1 tab po for 7 days   No facility-administered encounter medications on file as of 09/30/2019.

## 2019-09-30 ENCOUNTER — Other Ambulatory Visit: Payer: Self-pay

## 2019-09-30 ENCOUNTER — Encounter: Payer: Self-pay | Admitting: Family Medicine

## 2019-09-30 ENCOUNTER — Ambulatory Visit (INDEPENDENT_AMBULATORY_CARE_PROVIDER_SITE_OTHER): Payer: BC Managed Care – PPO | Admitting: Family Medicine

## 2019-09-30 VITALS — BP 110/80 | HR 83 | Temp 98.0°F | Ht 72.5 in | Wt 182.2 lb

## 2019-09-30 DIAGNOSIS — R5383 Other fatigue: Secondary | ICD-10-CM | POA: Diagnosis not present

## 2019-09-30 DIAGNOSIS — M5416 Radiculopathy, lumbar region: Secondary | ICD-10-CM

## 2019-09-30 DIAGNOSIS — N528 Other male erectile dysfunction: Secondary | ICD-10-CM | POA: Diagnosis not present

## 2019-09-30 MED ORDER — SILDENAFIL CITRATE 100 MG PO TABS: 50.0000 mg | ORAL_TABLET | Freq: Every day | ORAL | 11 refills | Status: DC | PRN

## 2019-10-04 LAB — TESTOS,TOTAL,FREE AND SHBG (FEMALE)
Free Testosterone: 49.4 pg/mL (ref 35.0–155.0)
Sex Hormone Binding: 22 nmol/L (ref 10–50)
Testosterone, Total, LC-MS-MS: 309 ng/dL (ref 250–1100)

## 2019-12-02 DIAGNOSIS — M5442 Lumbago with sciatica, left side: Secondary | ICD-10-CM | POA: Diagnosis not present

## 2019-12-02 DIAGNOSIS — M62830 Muscle spasm of back: Secondary | ICD-10-CM | POA: Diagnosis not present

## 2019-12-02 DIAGNOSIS — M5127 Other intervertebral disc displacement, lumbosacral region: Secondary | ICD-10-CM | POA: Diagnosis not present

## 2019-12-02 DIAGNOSIS — M5441 Lumbago with sciatica, right side: Secondary | ICD-10-CM | POA: Diagnosis not present

## 2019-12-25 ENCOUNTER — Other Ambulatory Visit: Payer: Self-pay

## 2019-12-25 ENCOUNTER — Ambulatory Visit: Payer: BC Managed Care – PPO

## 2019-12-25 DIAGNOSIS — Z20822 Contact with and (suspected) exposure to covid-19: Secondary | ICD-10-CM

## 2019-12-25 LAB — POC COVID19 BINAXNOW: SARS Coronavirus 2 Ag: NEGATIVE

## 2019-12-25 NOTE — Progress Notes (Signed)
Patient is a New [employer] for [employer].  Covid Antigen Test is negative.  

## 2020-01-01 DIAGNOSIS — M4602 Spinal enthesopathy, cervical region: Secondary | ICD-10-CM | POA: Diagnosis not present

## 2020-01-01 DIAGNOSIS — M62830 Muscle spasm of back: Secondary | ICD-10-CM | POA: Diagnosis not present

## 2020-01-01 DIAGNOSIS — M9901 Segmental and somatic dysfunction of cervical region: Secondary | ICD-10-CM | POA: Diagnosis not present

## 2020-01-09 ENCOUNTER — Other Ambulatory Visit: Payer: Self-pay | Admitting: *Deleted

## 2020-01-09 MED ORDER — GNP ALLERGY & CONGESTION 10-240 MG PO TB24: 1.0000 | ORAL_TABLET | Freq: Every day | ORAL | 3 refills | Status: DC

## 2020-01-22 ENCOUNTER — Encounter: Payer: Self-pay | Admitting: Internal Medicine

## 2020-01-22 ENCOUNTER — Ambulatory Visit: Payer: BC Managed Care – PPO | Admitting: Internal Medicine

## 2020-01-22 ENCOUNTER — Other Ambulatory Visit: Payer: Self-pay

## 2020-01-22 VITALS — BP 120/82 | HR 77 | Temp 98.0°F | Ht 72.0 in | Wt 187.0 lb

## 2020-01-22 DIAGNOSIS — K2100 Gastro-esophageal reflux disease with esophagitis, without bleeding: Secondary | ICD-10-CM | POA: Diagnosis not present

## 2020-01-22 DIAGNOSIS — G4733 Obstructive sleep apnea (adult) (pediatric): Secondary | ICD-10-CM | POA: Diagnosis not present

## 2020-01-22 DIAGNOSIS — J4520 Mild intermittent asthma, uncomplicated: Secondary | ICD-10-CM | POA: Diagnosis not present

## 2020-01-22 NOTE — Progress Notes (Signed)
PROBLEMS/synopsis Moderate persistent asthma Mild OSA  - intolerant to CPAP   DATA: Sleep study 02/02/16: AHI 11.8/hr with frequent arousals and sleep fragmentation. Spirometry 06/14/13: FEV1 3.85 liters (85% pred), FEV1/FVC 79% Spirometry 02/26/16:  FEV1 3.81 liters (84% pred), FEV1/FVC 81% CPAP compliance 11/05-12/04/17: Set pressure 9 cm H2O. Usage 20/30 days. 17 days > 4 hrs.  CPAP compliance: Set pressure 9 cm H2O. Usage 20/30 days. 17 days > 4 hrs      INTERVAL HISTORY: Last visit 07/13/18. No major pulmonary events  CC  Follow-up asthma Follow-up OSA  HPI  Asthma seems to be well-controlled at this time uses albuterol infrequently No exacerbation at this time No evidence of heart failure at this time No evidence or signs of infection at this time No respiratory distress No fevers, chills, nausea, vomiting, diarrhea No evidence of lower extremity edema No evidence hemoptysis   Patient has a diagnosis of sleep apnea however he does not wear CPAP machine  Patient wears dental compliance and according to his family it prevents him from snoring   Patient is to avoid triggers such as cold air weather and exercise GERD seems to be under control with Nexium Uses albuterol prior to exercise   At this time patient getting a dog and I advised patient to monitor for symptoms of asthma rhinitis runny nose itchy eyes  Patient also changed jobs 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 Patient is exposed to more environmental exposures such as pollen grass and smoke Previously employed at [employer]     OBJ: Vitals:   01/22/20 0926  BP: 120/82  Pulse: 77  Temp: 98 F (36.7 C)  TempSrc: Oral  SpO2: 99%  Weight: 187 lb (84.8 kg)  Height: 6' (1.829 m)    Review of Systems:  Gen:  Denies  fever, sweats, chills weight loss  HEENT: Denies blurred vision, double vision, ear pain, eye pain, hearing loss, nose bleeds, sore throat Cardiac:  No  dizziness, chest pain or heaviness, chest tightness,edema, No JVD Resp:   No cough, -sputum production, -shortness of breath,-wheezing, -hemoptysis,  Gi: Denies swallowing difficulty, stomach pain, nausea or vomiting, diarrhea, constipation, bowel incontinence Gu:  Denies bladder incontinence, burning urine Ext:   Denies Joint pain, stiffness or swelling Skin: Denies  skin rash, easy bruising or bleeding or hives Endoc:  Denies polyuria, polydipsia , polyphagia or weight change Psych:   Denies depression, insomnia or hallucinations  Other:  All other systems negative   Physical Examination:   General Appearance: No distress  Neuro:without focal findings,  speech normal,  HEENT: PERRLA, EOM intact.   Pulmonary: normal breath sounds, No wheezing.  CardiovascularNormal S1,S2.  No m/r/g.   Abdomen: Benign, Soft, non-tender. Renal:  No costovertebral tenderness  GU:  Not performed at this time. Endoc: No evident thyromegaly Skin:   warm, no rashes, no ecchymosis  Extremities: normal, no cyanosis, clubbing. PSYCHIATRIC: Mood, affect within normal limits.   ALL OTHER ROS ARE NEGATIVE    Current Outpatient Medications on File Prior to Visit  Medication Sig Dispense Refill  . albuterol (VENTOLIN HFA) 108 (90 Base) MCG/ACT inhaler Inhale 1-2 puffs into the lungs every 4 (four) hours as needed for wheezing or shortness of breath. 18 g 5  . budesonide-formoterol (SYMBICORT) 160-4.5 MCG/ACT inhaler INHALE ONE PUFF INTO THE LUNGS TWICE A DAY. RINSE, GARGE, SPIT AFTER USE. 3 Inhaler 3  . esomeprazole (NEXIUM) 20 MG capsule TAKE 1 CAPSULE BY MOUTH ONCE DAILY 90  capsule 3  . loratadine-pseudoephedrine (GNP ALLERGY & CONGESTION) 10-240 MG 24 hr tablet Take 1 tablet by mouth daily. 90 tablet 3  . montelukast (SINGULAIR) 10 MG tablet Take 1 tablet (10 mg total) by mouth at bedtime. 90 tablet 3  . Multiple Vitamin (MULTIVITAMIN) tablet Take 1 tablet by mouth daily.    . sildenafil (VIAGRA) 100 MG  tablet Take 0.5-1 tablets (50-100 mg total) by mouth daily as needed for erectile dysfunction. 10 tablet 11  . sildenafil (VIAGRA) 50 MG tablet      No current facility-administered medications on file prior to visit.     DATA: BMP Latest Ref Rng & Units 07/09/2019 03/18/2008 03/13/2008  Glucose 70 - 99 mg/dL 112(H) 94 126(H)  BUN 6 - 23 mg/dL 16 17 10   Creatinine 0.40 - 1.50 mg/dL 0.94 1.2 1.0  Sodium 135 - 145 mEq/L 142 140 140  Potassium 3.5 - 5.1 mEq/L 3.9 4.1 3.2(L)  Chloride 96 - 112 mEq/L 103 103 102  CO2 19 - 32 mEq/L 29 30 29   Calcium 8.4 - 10.5 mg/dL 9.4 9.7 9.3   CBC Latest Ref Rng & Units 07/09/2019  WBC 4.0 - 10.5 K/uL 6.4  Hemoglobin 13.0 - 17.0 g/dL 14.3  Hematocrit 39.0 - 52.0 % 43.0  Platelets 150.0 - 400.0 K/uL 247.0    CXR: NNF  Assessment and plan 44 year old pleasant white male with a diagnosis of mild intermittent asthma which is well controlled at this time with avoidance of triggers and using albuterol as needed along with Symbicort in the setting of obstructive sleep apnea controlled by dental appliance  Mild intermittent asthma Seems to be controlled at this time with Symbicort Patient is to avoid triggers Patient is to monitor for symptoms especially when he is going to get a dog Rinse mouth after every use Continue Singulair Albuterol as needed  OSA Continues to wear dental device This seems to be helping his snoring    COVID-19 EDUCATION: The signs and symptoms of COVID-19 were discussed with the patient and how to seek care for testing (follow up with PCP or arrange E-visit).  The importance of social distancing was discussed today.  MEDICATION ADJUSTMENTS/LABS AND TESTS ORDERED: Continue dental device for sleep apnea Continue Symbicort as prescribed Avoid allergens  Avoid sick contacts  CURRENT MEDICATIONS REVIEWED AT LENGTH WITH PATIENT TODAY   Patient/Family are satisfied with Plan of action and management. All questions  answered  Follow up 1 year  Total time spent 31 minutes  Corrin Parker, M.D.  [facility]  Medical Director [place] Director [facility] Cardio-Pulmonary Department

## 2020-01-22 NOTE — Patient Instructions (Signed)
Continue inhalers as prescribed AVOID TRIGGERS CAN USE UP TO 4 PUFFS of ALBUTEROL PRIOR TO EXERCISE

## 2020-02-18 NOTE — Telephone Encounter (Signed)
Pt had visit on 09/04/19.

## 2020-03-02 DIAGNOSIS — M4602 Spinal enthesopathy, cervical region: Secondary | ICD-10-CM | POA: Diagnosis not present

## 2020-03-02 DIAGNOSIS — M62830 Muscle spasm of back: Secondary | ICD-10-CM | POA: Diagnosis not present

## 2020-03-02 DIAGNOSIS — M9901 Segmental and somatic dysfunction of cervical region: Secondary | ICD-10-CM | POA: Diagnosis not present

## 2020-03-24 ENCOUNTER — Other Ambulatory Visit: Payer: Self-pay

## 2020-03-24 MED ORDER — MONTELUKAST SODIUM 10 MG PO TABS: 10.0000 mg | ORAL_TABLET | Freq: Every day | ORAL | 3 refills | Status: DC

## 2020-03-24 NOTE — Telephone Encounter (Signed)
Rx for singulair has been sent to [facility] pharmacy as requested.

## 2020-04-02 DIAGNOSIS — M62830 Muscle spasm of back: Secondary | ICD-10-CM | POA: Diagnosis not present

## 2020-04-02 DIAGNOSIS — M9901 Segmental and somatic dysfunction of cervical region: Secondary | ICD-10-CM | POA: Diagnosis not present

## 2020-04-02 DIAGNOSIS — M4602 Spinal enthesopathy, cervical region: Secondary | ICD-10-CM | POA: Diagnosis not present

## 2020-04-30 ENCOUNTER — Other Ambulatory Visit: Payer: Self-pay

## 2020-04-30 ENCOUNTER — Encounter: Payer: Self-pay | Admitting: Medical

## 2020-04-30 ENCOUNTER — Ambulatory Visit: Payer: BC Managed Care – PPO | Admitting: Medical

## 2020-04-30 VITALS — BP 136/93 | HR 97 | Temp 98.4°F | Ht 73.0 in | Wt 190.2 lb

## 2020-04-30 DIAGNOSIS — M4602 Spinal enthesopathy, cervical region: Secondary | ICD-10-CM | POA: Diagnosis not present

## 2020-04-30 DIAGNOSIS — M25572 Pain in left ankle and joints of left foot: Secondary | ICD-10-CM

## 2020-04-30 DIAGNOSIS — M62830 Muscle spasm of back: Secondary | ICD-10-CM | POA: Diagnosis not present

## 2020-04-30 DIAGNOSIS — M9906 Segmental and somatic dysfunction of lower extremity: Secondary | ICD-10-CM | POA: Diagnosis not present

## 2020-04-30 DIAGNOSIS — M9901 Segmental and somatic dysfunction of cervical region: Secondary | ICD-10-CM | POA: Diagnosis not present

## 2020-04-30 NOTE — Patient Instructions (Signed)
Ankle Pain The ankle joint holds your body weight and allows you to move around. Ankle pain can occur on either side or the back of one ankle or both ankles. Ankle pain may be sharp and burning or dull and aching. There may be tenderness, stiffness, redness, or warmth around the ankle. Many things can cause ankle pain, including an injury to the area and overuse of the ankle. Follow these instructions at home: Activity  Rest your ankle as told by your health care provider. Avoid any activities that cause ankle pain.  Do not use the injured limb to support your body weight until your health care provider says that you can. Use crutches as told by your health care provider.  Do exercises as told by your health care provider.  Ask your health care provider when it is safe to drive if you have a brace on your ankle. If you have a brace:  Wear the brace as told by your health care provider. Remove it only as told by your health care provider.  Loosen the brace if your toes tingle, become numb, or turn cold and blue.  Keep the brace clean.  If the brace is not waterproof: ? Do not let it get wet. ? Cover it with a watertight covering when you take a bath or shower. If you were given an elastic bandage:   Remove it when you take a bath or a shower.  Try not to move your ankle very much, but wiggle your toes from time to time. This helps to prevent swelling.  Adjust the bandage to make it more comfortable if it feels too tight.  Loosen the bandage if you have numbness or tingling in your foot or if your foot turns cold and blue. Managing pain, stiffness, and swelling   If directed, put ice on the painful area. ? If you have a removable brace or elastic bandage, remove it as told by your health care provider. ? Put ice in a plastic bag. ? Place a towel between your skin and the bag. ? Leave the ice on for 20 minutes, 2-3 times a day.  Move your toes often to avoid stiffness and to  lessen swelling.  Raise (elevate) your ankle above the level of your heart while you are sitting or lying down. General instructions  Record information about your pain. Writing down the following may be helpful for you and your health care provider: ? How often you have ankle pain. ? Where the pain is located. ? What the pain feels like.  If treatment involves wearing a prescribed shoe or insole, make sure you wear it correctly and for as long as told by your health care provider.  Take over-the-counter and prescription medicines only as told by your health care provider.  Keep all follow-up visits as told by your health care provider. This is important. Contact a health care provider if:  Your pain gets worse.  Your pain is not relieved with medicines.  You have a fever or chills.  You are having more trouble with walking.  You have new symptoms. Get help right away if:  Your foot, leg, toes, or ankle: ? Tingles or becomes numb. ? Becomes swollen. ? Turns pale or blue. Summary  Ankle pain can occur on either side or the back of one ankle or both ankles.  Ankle pain may be sharp and burning or dull and aching.  Rest your ankle as told by your health care provider.   If told, apply ice to the area.  Take over-the-counter and prescription medicines only as told by your health care provider. This information is not intended to replace advice given to you by your health care provider. Make sure you discuss any questions you have with your health care provider. Document Revised: 11/27/2018 Document Reviewed: 02/14/2018 Elsevier Patient Education  2020 [employer]  

## 2020-04-30 NOTE — Progress Notes (Signed)
   Subjective:    Patient ID: Antonio Lara, male    DOB: [birth year], 44 y.o.   MRN: [id number]  HPI 44 yo male in non acute distress.  Presents today with  2 month history of  Ankle pain on the left side. Recalls no injury. He does not exercise. 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 He recalls no injury. He has been taking nothing for pain, or any other treatment.    Blood pressure (!) 136/93, pulse 97, temperature 98.4 F (36.9 C), temperature source Oral, height 6\' 1"  (1.854 m), weight 190 lb 3.2 oz (86.3 kg), SpO2 100 %.   Allergies  Allergen Reactions  . Oxycodone-Acetaminophen     REACTION: extemely dizzy with nausea and vomiting.    Review of Systems  Musculoskeletal: Positive for gait problem (left ankle pain with walking, worse at the end of his shift.).  Neurological: Negative for numbness (tingling).      He also found out today that he has shorter foot due to loss of arch in his foot on the right sid at visit to [place]. Objective:   Physical Exam Musculoskeletal:     Right foot: Normal range of motion. No deformity, bunion, Charcot foot, foot drop or prominent metatarsal heads.     Left foot: Normal range of motion. No deformity, bunion, Charcot foot, foot drop or prominent metatarsal heads.       Feet:  Feet:     Right foot:     Skin integrity: Skin integrity normal.     Toenail Condition: Right toenails are normal.     Left foot:     Skin integrity: Skin integrity normal.     Toenail Condition: Left toenails are normal.           Assessment & Plan:  Left ankle pain Ace wrapped and demonstrated how to rewrap ankle. Ice and elevate the left foot/ankle. OTC Motrin 800mg  by mouth every 8 hours with food x  7-10 days. If any problems with medication call the clinic. Orders Placed This Encounter  Procedures  . Ambulatory referral to Podiatry    Referral Priority:   Routine    Referral Type:    Consultation    Referral Reason:   Specialty Services Required    Referred to Provider:   [place]    Requested Specialty:   Podiatry    Number of Visits Requested:   1  for possible orthotics and/or steroid injection. Called patient about being on Loratadine and pseudoephedrine daily, he does take it daily, I recommended he stop the Lortatadine-pseudoephedrine can cause arrythmhmias and high blood pressure, and to  just switch to OTC plain Loratadine. He verbalizes understanding of all we had discussed and has no further questions at .

## 2020-05-05 ENCOUNTER — Encounter: Payer: Self-pay | Admitting: Medical

## 2020-05-05 ENCOUNTER — Other Ambulatory Visit: Payer: Self-pay

## 2020-05-05 ENCOUNTER — Ambulatory Visit: Payer: BC Managed Care – PPO | Admitting: *Deleted

## 2020-05-05 ENCOUNTER — Telehealth: Payer: BC Managed Care – PPO | Admitting: Medical

## 2020-05-05 DIAGNOSIS — W540XXA Bitten by dog, initial encounter: Secondary | ICD-10-CM

## 2020-05-05 DIAGNOSIS — Z20822 Contact with and (suspected) exposure to covid-19: Secondary | ICD-10-CM

## 2020-05-05 LAB — POC COVID19 BINAXNOW: SARS Coronavirus 2 Ag: NEGATIVE

## 2020-05-05 MED ORDER — AMOXICILLIN-POT CLAVULANATE 875-125 MG PO TABS: 1.0000 | ORAL_TABLET | Freq: Two times a day (BID) | ORAL | 0 refills | Status: DC

## 2020-05-05 MED ORDER — IBUPROFEN 800 MG PO TABS: 800.0000 mg | ORAL_TABLET | Freq: Three times a day (TID) | ORAL | 0 refills | Status: DC | PRN

## 2020-05-05 NOTE — Progress Notes (Signed)
   Subjective:    Patient ID: Antonio Lara, male    DOB: [birth year], 44 y.o.   MRN: [id number]  HPI 44 yo male in non acute distress, consents to  Telemedicine appointment  Chills last night, went to bed.woke up this morning feeling much better.   Has a dog bite on Sunday  Review of Systems  Constitutional: Positive for chills.  Musculoskeletal:       Right hand pain, sent picture for me to view, he states pus was coming out of themedial side of  Palm wound. Painful, sharp pain on dependency.   Skin: Positive for wound (right hand ,medial side of lowerarea of  palm and thumb).      Objective:   Physical Exam AXOX3 view of dog bite on right hand Erythema on laceration of medial side of palm. Swelling noted to right thumb , can move finger per patient , however swelling limits ROM.   Recent Results (from the past 2160 hour(s))  POC COVID-19     Status: Normal   Collection Time: 05/05/20  3:31 PM  Result Value Ref Range   SARS Coronavirus 2 Ag Negative Negative    Comment: Pt aware. [place] visit completed by H.Jacarra Bobak PAC.        Assessment & Plan:  Dog bit  Chills Epsom salt  Soaks twice daily , neosporin and a bandage to the wounds. Meds ordered this encounter  Medications  . amoxicillin-clavulanate (AUGMENTIN) 875-125 MG tablet    Sig: Take 1 tablet by mouth 2 (two) times daily.    Dispense:  20 tablet    Refill:  0  . ibuprofen (ADVIL) 800 MG tablet    Sig: Take 1 tablet (800 mg total) by mouth every 8 (eight) hours as needed.    Dispense:  30 tablet    Refill:  0  Follow up on Friday per apppt, sooner if worsening. Patient verbalizes understanding and has no questions at the end of our conversation.

## 2020-05-07 ENCOUNTER — Other Ambulatory Visit: Payer: Self-pay

## 2020-05-07 ENCOUNTER — Ambulatory Visit: Payer: BC Managed Care – PPO | Admitting: Nurse Practitioner

## 2020-05-07 MED ORDER — BUDESONIDE-FORMOTEROL FUMARATE 160-4.5 MCG/ACT IN AERO: INHALATION_SPRAY | RESPIRATORY_TRACT | 11 refills | Status: DC

## 2020-05-07 NOTE — Telephone Encounter (Signed)
Rx for symbicort 160 has been sent to [facility] pharmacy.

## 2020-05-08 ENCOUNTER — Encounter: Payer: Self-pay | Admitting: Nurse Practitioner

## 2020-05-08 ENCOUNTER — Ambulatory Visit: Payer: BC Managed Care – PPO | Admitting: Nurse Practitioner

## 2020-05-08 ENCOUNTER — Other Ambulatory Visit: Payer: Self-pay

## 2020-05-08 VITALS — BP 155/97 | HR 81 | Temp 97.1°F | Resp 16 | Ht 73.0 in | Wt 192.0 lb

## 2020-05-08 DIAGNOSIS — S60419S Abrasion of unspecified finger, sequela: Secondary | ICD-10-CM

## 2020-05-08 NOTE — Progress Notes (Signed)
   Subjective:    Patient ID: Antonio Lara, male    DOB: [birth year], 44 y.o.   MRN: [id number]  HPI 44 year old male last seen 9/14 for dog bight sustained to right hand. He was bit by his own dog. Last Tdap was 2020. He has since last visit been taking Augmentin, keeping wound covered with antibiotic ointment and gauze.   Patient states mobility of thumb has improved. Denies pain at joints.  Worked overnight and has only slept for 40 minutes.   Last physical was November (2020) with Labour. Had lipids done at that time.    Review of Systems  Constitutional: Negative for fever.  Skin: Positive for wound.  Neurological: Negative for numbness.       Objective:   Physical Exam Constitutional:      Appearance: Normal appearance.  Musculoskeletal:       Hands:     Comments: Noted area in red where tissue remains swollen. Abrasions marked with black. No pain with palpation of joints in thumb. Able to bend thumb with some limitation due to swelling of tissue but not pain in joint. Distal circulation intact. Sensation intact.   Neurological:     Mental Status: He is alert.           Assessment & Plan:  Redressed hand with topical abx ointment and gauze. Advised to continue to wash with mild soap and water.   Keep covered to limit exposure to other environmental irritants.   Continue NSAID for pain and inflammation relief.   Will RTC next week for BP reheck, likely increased due to lack of sleep and caffeine intake last night. He will return to day shift next week and will come to clinic for BP recheck. Also encouraged him to schedule a physical with his PCP this November to recheck lipids and to continue to monitor BP.  RTC earlier with any new or worsening symptoms.

## 2020-05-15 ENCOUNTER — Other Ambulatory Visit: Payer: Self-pay

## 2020-05-15 ENCOUNTER — Ambulatory Visit: Payer: BC Managed Care – PPO | Admitting: Nurse Practitioner

## 2020-05-15 ENCOUNTER — Encounter: Payer: Self-pay | Admitting: Family Medicine

## 2020-05-15 VITALS — BP 146/100 | HR 74 | Temp 97.0°F | Resp 16

## 2020-05-15 DIAGNOSIS — Z0130 Encounter for examination of blood pressure without abnormal findings: Secondary | ICD-10-CM

## 2020-05-15 NOTE — Progress Notes (Signed)
Patient presents for BP check. 146/100 was on the dynamap and then I rechecked a manual bp o f 132/100. Both Bps were taken in the right arm. Advised S.Parker, FNP, who went in to discuss elevated bp trend with patient.

## 2020-05-15 NOTE — Progress Notes (Signed)
   Subjective:    Patient ID: Antonio Lara, male    DOB: 1976/05/27, 44 y.o.   MRN: [id number]  HPI 44 year old patient here for BP check, has had higher readings on most recent visits. Last visit was after a night shift so he was asked to return today while working day shift to recheck BP.   Dog bit is healing well he is still on antibiotics, is cleaning wound and keeping it covered.   Today's Vitals   05/15/20 0921  BP: (!) 146/100  Pulse: 74  Resp: 16  Temp: (!) 97 F (36.1 C)  TempSrc: Temporal  SpO2: 97%   There is no height or weight on file to calculate BMI.  Review of Systems     Objective:   Physical Exam Constitutional:      Appearance: Normal appearance.  Neurological:     Mental Status: He is alert.  Psychiatric:        Mood and Affect: Mood normal.        Behavior: Behavior normal.           Assessment & Plan:  Asked patient to schedule follow up with PCP Dr. Patsy Lager. Patient will do that, discussed diet briefly and told patient about Dietician that comes to [facility] as he may benefit from dietary counseling related to his blood pressure.   After follow up with PCP patient is aware we can assist with lab draws, blood pressure checks here at [employer].   RTC with any new or worsening symptoms as discussed

## 2020-05-15 NOTE — Patient Instructions (Signed)
Follow up with PCP Dr. Patsy Lager to discuss blood pressure readings. Notes should be available through Epic for PCP to review.

## 2020-05-18 ENCOUNTER — Encounter: Payer: Self-pay | Admitting: Podiatry

## 2020-05-18 ENCOUNTER — Ambulatory Visit: Payer: BC Managed Care – PPO | Admitting: Podiatry

## 2020-05-18 ENCOUNTER — Ambulatory Visit (INDEPENDENT_AMBULATORY_CARE_PROVIDER_SITE_OTHER): Payer: BC Managed Care – PPO

## 2020-05-18 ENCOUNTER — Other Ambulatory Visit: Payer: Self-pay

## 2020-05-18 ENCOUNTER — Other Ambulatory Visit: Payer: Self-pay | Admitting: Podiatry

## 2020-05-18 DIAGNOSIS — M722 Plantar fascial fibromatosis: Secondary | ICD-10-CM

## 2020-05-18 DIAGNOSIS — M779 Enthesopathy, unspecified: Secondary | ICD-10-CM

## 2020-05-18 MED ORDER — METHYLPREDNISOLONE 4 MG PO TBPK: ORAL_TABLET | ORAL | 0 refills | Status: DC

## 2020-05-18 MED ORDER — MELOXICAM 15 MG PO TABS: 15.0000 mg | ORAL_TABLET | Freq: Every day | ORAL | 3 refills | Status: DC

## 2020-05-18 NOTE — Progress Notes (Unsigned)
Dg  

## 2020-05-18 NOTE — Progress Notes (Signed)
Subjective:  Patient ID: Antonio Lara, male    DOB: 1976/06/09,  MRN: [id number] HPI Chief Complaint  Patient presents with  . Foot Pain    Dorsal midfoot and lateral ankle bilateral - aching and swelling laterally to left ankle, no injury, thinks feet have flattened, tried Good Feet insoles-some help  . New Patient (Initial Visit)    44 y.o. male presents with the above complaint.   ROS: Denies fever chills nausea vomiting muscle aches pains calf pain back pain chest pain shortness of breath.  Past Medical History:  Diagnosis Date  . Back pain, chronic    Dr. Annell Greening  . GERD (gastroesophageal reflux disease)   . Mild obstructive sleep apnea, controlled with dental appliance   . Moderate persistent asthma   . Mononucleosis    in high school   No past surgical history on file.  Current Outpatient Medications:  .  albuterol (VENTOLIN HFA) 108 (90 Base) MCG/ACT inhaler, Inhale 1-2 puffs into the lungs every 4 (four) hours as needed for wheezing or shortness of breath., Disp: 18 g, Rfl: 5 .  amoxicillin-clavulanate (AUGMENTIN) 875-125 MG tablet, Take 1 tablet by mouth 2 (two) times daily., Disp: 20 tablet, Rfl: 0 .  budesonide-formoterol (SYMBICORT) 160-4.5 MCG/ACT inhaler, INHALE ONE PUFF INTO THE LUNGS TWICE A DAY. RINSE, GARGE, SPIT AFTER USE., Disp: 10.2 g, Rfl: 11 .  esomeprazole (NEXIUM) 20 MG capsule, TAKE 1 CAPSULE BY MOUTH ONCE DAILY, Disp: 90 capsule, Rfl: 3 .  ibuprofen (ADVIL) 800 MG tablet, Take 1 tablet (800 mg total) by mouth every 8 (eight) hours as needed., Disp: 30 tablet, Rfl: 0 .  meloxicam (MOBIC) 15 MG tablet, Take 1 tablet (15 mg total) by mouth daily., Disp: 30 tablet, Rfl: 3 .  methylPREDNISolone (MEDROL DOSEPAK) 4 MG TBPK tablet, 6 day dose pack - take as directed, Disp: 21 tablet, Rfl: 0 .  montelukast (SINGULAIR) 10 MG tablet, Take 1 tablet (10 mg total) by mouth at bedtime., Disp: 90 tablet, Rfl: 3 .  Multiple Vitamin (MULTIVITAMIN) tablet, Take 1  tablet by mouth daily., Disp: , Rfl:  .  sildenafil (VIAGRA) 100 MG tablet, Take 0.5-1 tablets (50-100 mg total) by mouth daily as needed for erectile dysfunction., Disp: 10 tablet, Rfl: 11 .  sildenafil (VIAGRA) 50 MG tablet, , Disp: , Rfl:   Allergies  Allergen Reactions  . Oxycodone-Acetaminophen     REACTION: extemely dizzy with nausea and vomiting.   Review of Systems Objective:  There were no vitals filed for this visit.  General: Well developed, nourished, in no acute distress, alert and oriented x3   Dermatological: Skin is warm, dry and supple bilateral. Nails x 10 are well maintained; remaining integument appears unremarkable at this time. There are no open sores, no preulcerative lesions, no rash or signs of infection present.  Vascular: Dorsalis Pedis artery and Posterior Tibial artery pedal pulses are 2/4 bilateral with immedate capillary fill time. Pedal hair growth present. No varicosities and no lower extremity edema present bilateral.   Neruologic: Grossly intact via light touch bilateral. Vibratory intact via tuning fork bilateral. Protective threshold with Semmes Wienstein monofilament intact to all pedal sites bilateral. Patellar and Achilles deep tendon reflexes 2+ bilateral. No Babinski or clonus noted bilateral.   Musculoskeletal: No gross boney pedal deformities bilateral. No pain, crepitus, or limitation noted with foot and ankle range of motion bilateral. Muscular strength 5/5 in all groups tested bilateral.  Pain on palpation medial calcaneal tubercles bilaterally with pain  on palpation of the fourth fifth TMT joints bilaterally.  Gait: Unassisted, Nonantalgic.    Radiographs:  Radiographs taken today demonstrate an osseously mature individual with soft tissue increase in density plantar fashion calcaneal insertion sites.  No other significant osseous abnormalities are noted rectus foot type.  Assessment & Plan:   Assessment: Plantar fasciitis bilateral with  lateral compensatory syndrome.  Plan: Placed him on methylprednisolone to be followed by meloxicam.  He will follow up with [facility] for orthotics.  I did not give him injections today since that the chronicity of this is been quite sometime only recently increasing pain in the ankle.  So I did place him in plantar fascial braces discussed appropriate shoe gear stretching exercises and ice therapy we will follow-up with him once the orthotics are in.     Katelynn Heidler T. [place], [place]

## 2020-05-19 DIAGNOSIS — M4602 Spinal enthesopathy, cervical region: Secondary | ICD-10-CM | POA: Diagnosis not present

## 2020-05-19 DIAGNOSIS — M9906 Segmental and somatic dysfunction of lower extremity: Secondary | ICD-10-CM | POA: Diagnosis not present

## 2020-05-19 DIAGNOSIS — M9901 Segmental and somatic dysfunction of cervical region: Secondary | ICD-10-CM | POA: Diagnosis not present

## 2020-05-19 DIAGNOSIS — M62830 Muscle spasm of back: Secondary | ICD-10-CM | POA: Diagnosis not present

## 2020-05-28 ENCOUNTER — Ambulatory Visit (INDEPENDENT_AMBULATORY_CARE_PROVIDER_SITE_OTHER): Payer: BC Managed Care – PPO | Admitting: Orthotics

## 2020-05-28 ENCOUNTER — Other Ambulatory Visit: Payer: Self-pay

## 2020-05-28 DIAGNOSIS — M722 Plantar fascial fibromatosis: Secondary | ICD-10-CM

## 2020-05-28 NOTE — Progress Notes (Signed)

## 2020-06-03 ENCOUNTER — Ambulatory Visit: Payer: BC Managed Care – PPO | Admitting: Nurse Practitioner

## 2020-06-03 ENCOUNTER — Other Ambulatory Visit: Payer: Self-pay

## 2020-06-03 ENCOUNTER — Encounter: Payer: Self-pay | Admitting: Nurse Practitioner

## 2020-06-03 ENCOUNTER — Encounter: Payer: Self-pay | Admitting: Podiatry

## 2020-06-03 VITALS — BP 148/95 | HR 87 | Temp 97.3°F | Wt 200.0 lb

## 2020-06-03 DIAGNOSIS — T148XXA Other injury of unspecified body region, initial encounter: Secondary | ICD-10-CM

## 2020-06-03 DIAGNOSIS — L089 Local infection of the skin and subcutaneous tissue, unspecified: Secondary | ICD-10-CM

## 2020-06-03 MED ORDER — AMOXICILLIN-POT CLAVULANATE 875-125 MG PO TABS: 1.0000 | ORAL_TABLET | Freq: Two times a day (BID) | ORAL | 0 refills | Status: DC

## 2020-06-03 NOTE — Patient Instructions (Signed)
Blood pressure records starting from 01/2019  June 2020      130/78  Sept 04/30/2020 136/93  Sept 05/08/20   155/97  Sept 05/15/20   146/100  Oct 13/21       148/95  Please share record of Bps with PCP as they have been trending up over the past few months   Antibiotic for new wound from dog bite, return to clinic with any new or worsening symptoms

## 2020-06-03 NOTE — Progress Notes (Signed)
   Subjective:    Patient ID: Antonio Lara, male    DOB: [birth year], 44 y.o.   MRN: [id number]  HPI  44 year old male here for new dog bite. He was separating his dog from neighbor dog that were fighting and was bit by he believes his neighbor's dog. Both dog's are up to date on vaccinations. Patient is up to date on TD.   Had recent dog bite on the same hand (right) that has since resolved though scar tissue is starting to cause some deeper pain and he has sought care with a hand specialist for this.   Since time of new bite he did note some discharge from are just distal to right thumb  Since last visit he has contacted PCP regarding increased BP  Today's Vitals   06/03/20 1337  BP: (!) 148/95  Pulse: 87  Temp: (!) 97.3 F (36.3 C)  TempSrc: Temporal  SpO2: 99%  Weight: 200 lb (90.7 kg)   Body mass index is 26.39 kg/m. Review of Systems  Constitutional: Negative.   Skin: Positive for wound.   Past Medical History:  Diagnosis Date  . Back pain, chronic    Dr. Annell Greening  . GERD (gastroesophageal reflux disease)   . Mild obstructive sleep apnea, controlled with dental appliance   . Moderate persistent asthma   . Mononucleosis    in high school        Objective:   Physical Exam Constitutional:      Appearance: Normal appearance.  HENT:     Head: Normocephalic.  Skin:    Findings: Signs of injury and wound present.     Comments: Wound to right hand just proximal to thumb, small pinpoint abrasion without drainage surrounding skin intact minimal erythema surrounding temperature of skin intact distal circulation intact.   Neurological:     Mental Status: He is alert and oriented to person, place, and time.  Psychiatric:        Mood and Affect: Mood normal.           Assessment & Plan:   Keep wound clean with soap and water, cover when out or at work with topical antibiotic ointment and bandage. Start abx given recent wound to neighboring area and  patient's note of pussy drainage from new wound.   Meds ordered this encounter  Medications  . amoxicillin-clavulanate (AUGMENTIN) 875-125 MG tablet    Sig: Take 1 tablet by mouth 2 (two) times daily.    Dispense:  20 tablet    Refill:  0    May use OTC pain medication as needed.   Return to clinic if symptoms persist/worsen or with new concerns.  Reviewed all recent Blood pressure levels going back to 01/2019 and encouraged patient to reach out to PCP to review ongoing elevated BP levels with PCP.

## 2020-06-04 ENCOUNTER — Ambulatory Visit: Payer: BC Managed Care – PPO | Admitting: Nurse Practitioner

## 2020-06-10 ENCOUNTER — Other Ambulatory Visit: Payer: BC Managed Care – PPO | Admitting: Orthotics

## 2020-06-23 DIAGNOSIS — M4602 Spinal enthesopathy, cervical region: Secondary | ICD-10-CM | POA: Diagnosis not present

## 2020-06-23 DIAGNOSIS — M62830 Muscle spasm of back: Secondary | ICD-10-CM | POA: Diagnosis not present

## 2020-06-23 DIAGNOSIS — M9901 Segmental and somatic dysfunction of cervical region: Secondary | ICD-10-CM | POA: Diagnosis not present

## 2020-06-23 DIAGNOSIS — M9906 Segmental and somatic dysfunction of lower extremity: Secondary | ICD-10-CM | POA: Diagnosis not present

## 2020-06-25 ENCOUNTER — Ambulatory Visit: Payer: BC Managed Care – PPO | Admitting: Orthotics

## 2020-06-25 ENCOUNTER — Other Ambulatory Visit: Payer: Self-pay

## 2020-06-25 DIAGNOSIS — M722 Plantar fascial fibromatosis: Secondary | ICD-10-CM

## 2020-06-25 NOTE — Progress Notes (Signed)
Patient came in today to pick up custom made foot orthotics.  The goals were accomplished and the patient reported no dissatisfaction with said orthotics.  Patient was advised of breakin period and how to report any issues. 

## 2020-06-29 ENCOUNTER — Ambulatory Visit: Payer: BC Managed Care – PPO | Admitting: Podiatry

## 2020-07-13 ENCOUNTER — Ambulatory Visit: Payer: BC Managed Care – PPO | Admitting: Podiatry

## 2020-07-13 ENCOUNTER — Other Ambulatory Visit: Payer: Self-pay

## 2020-07-13 ENCOUNTER — Encounter: Payer: Self-pay | Admitting: Podiatry

## 2020-07-13 DIAGNOSIS — M722 Plantar fascial fibromatosis: Secondary | ICD-10-CM | POA: Diagnosis not present

## 2020-07-13 NOTE — Progress Notes (Signed)
He presents today for follow-up of his orthotics.  He states that his heels are doing much better however he is having more pain with his tips of his toes and in his legs because he cannot wear his orthotics and most of his shoes because they are too short and his toes hang off.  Objective: Vital signs are stable he is alert oriented x3 physical exam demonstrates that his orthotics are way too short and that his pain in his heels are starting to subside.  Assessment: Plantar fasciitis resolving.  Plan: He is to follow-up with [facility] for orthotic reevaluation.

## 2020-07-22 ENCOUNTER — Ambulatory Visit: Payer: BC Managed Care – PPO | Admitting: Orthotics

## 2020-07-22 ENCOUNTER — Other Ambulatory Visit: Payer: Self-pay

## 2020-07-22 DIAGNOSIS — M722 Plantar fascial fibromatosis: Secondary | ICD-10-CM

## 2020-07-22 NOTE — Progress Notes (Signed)
Adjustments...lengthen top cover, h/s cushion, and diamond soling on post.

## 2020-08-12 ENCOUNTER — Ambulatory Visit: Payer: BC Managed Care – PPO | Admitting: Orthotics

## 2020-08-12 ENCOUNTER — Other Ambulatory Visit: Payer: Self-pay

## 2020-08-12 DIAGNOSIS — M722 Plantar fascial fibromatosis: Secondary | ICD-10-CM

## 2020-08-12 NOTE — Progress Notes (Signed)
Patient picked up f/o and was pleased with fit, comfort, and function.  Worked well with footwear.  Told of rbeak in period and how to report any issues.  

## 2020-09-30 DIAGNOSIS — M62830 Muscle spasm of back: Secondary | ICD-10-CM | POA: Diagnosis not present

## 2020-09-30 DIAGNOSIS — M4602 Spinal enthesopathy, cervical region: Secondary | ICD-10-CM | POA: Diagnosis not present

## 2020-09-30 DIAGNOSIS — M9906 Segmental and somatic dysfunction of lower extremity: Secondary | ICD-10-CM | POA: Diagnosis not present

## 2020-09-30 DIAGNOSIS — M9901 Segmental and somatic dysfunction of cervical region: Secondary | ICD-10-CM | POA: Diagnosis not present

## 2020-10-09 ENCOUNTER — Other Ambulatory Visit: Payer: Self-pay | Admitting: Family Medicine

## 2020-10-09 ENCOUNTER — Telehealth: Payer: Self-pay

## 2020-10-09 NOTE — Telephone Encounter (Signed)
Unable to reach pt by phone; I spoke with pts wife and pt is on night shift and is sleeping now; pts wife said pt has mentioned that for the last few office visits with other providers as well as cking BP at home pts BP has been elevated. Mrs Comas does not know the BP readings. Mrs Werth said to her knowledge pt has not had H/A., dizziness, or CP and no more SOB than usual due to asthma. She did mention that sometimes pt has SOB upon exertion. Offered Mrs Creps a sooner appt than 10/19/20 but she said they are going out of town and will not be back until 10/15/20. Pt is not on any BP meds. UC & ED precautions given and Mrs Haueter voiced understanding and will speak with pt later today and pt or pts wife will cb if appt needed prior to 10/19/20. Sending note to Dr Patsy Lager who is out of office today and Lupita Leash CMA.

## 2020-10-19 ENCOUNTER — Encounter: Payer: Self-pay | Admitting: Family Medicine

## 2020-10-19 ENCOUNTER — Other Ambulatory Visit: Payer: Self-pay

## 2020-10-19 ENCOUNTER — Ambulatory Visit: Payer: BC Managed Care – PPO | Admitting: Family Medicine

## 2020-10-19 VITALS — BP 130/90 | HR 87 | Temp 97.7°F | Ht 72.5 in | Wt 199.2 lb

## 2020-10-19 DIAGNOSIS — I10 Essential (primary) hypertension: Secondary | ICD-10-CM

## 2020-10-19 MED ORDER — HYDROCHLOROTHIAZIDE 12.5 MG PO TABS: 12.5000 mg | ORAL_TABLET | Freq: Every day | ORAL | 3 refills | Status: DC

## 2020-10-19 NOTE — Patient Instructions (Signed)
Check BP twice a week only, write down the #'s.  In 1 month mychart message to me.  If they are less than 130/80 - ok to not send my anything.

## 2020-10-19 NOTE — Progress Notes (Signed)
Antonio Postma T. Haja Crego, MD, CAQ Sports Medicine  Primary Care and Sports Medicine [facility] at [facility] [address] [place], [address]  Phone: [phone]  FAX: [phone]  Antonio Lara - 45 y.o. male  MRN [id number]  Date of Birth: 1976/06/29  Date: 10/19/2020  PCP: Hannah Beat, MD  Referral: Hannah Beat, MD  Chief Complaint  Patient presents with  . Hypertension    This visit occurred during the SARS-CoV-2 public health emergency.  Safety protocols were in place, including screening questions prior to the visit, additional usage of staff PPE, and extensive cleaning of exam room while observing appropriate contact time as indicated for disinfecting solutions.   Subjective:   Antonio Lara is a 45 y.o. very pleasant male patient with Body mass index is 26.65 kg/m. who presents with the following:  F/u BP:   Had been pretty high and now it is a little bit lower.  Feels like he is not right. Will be fatigued with walking three seteps and then able to walk several miles.   He feels a sensation of fullness or beating in context of higher BP.  Sometimes will feel a little vertigo.   Parents are on bp.  Weight is up.  He has been having some foot problems.   145/98 on my recheck  Review of Systems is noted in the HPI, as appropriate  Objective:   BP 130/90   Pulse 87   Temp 97.7 F (36.5 C) (Temporal)   Ht 6' 0.5" (1.842 m)   Wt 199 lb 4 oz (90.4 kg)   SpO2 99%   BMI 26.65 kg/m   GEN: No acute distress; alert,appropriate. PULM: Breathing comfortably in no respiratory distress PSYCH: Normally interactive.  CV: RRR, no m/g/r   Laboratory and Imaging Data:  Assessment and Plan:     ICD-10-CM   1. Hypertension, essential  I10    Start HCTZ, titrate up as needed  Patient Instructions  Check BP twice a week only, write down the #'s.  In 1 month mychart message to me.  If they are less than 130/80 - ok  to not send my anything.    Meds ordered this encounter  Medications  . hydrochlorothiazide (HYDRODIURIL) 12.5 MG tablet    Sig: Take 1 tablet (12.5 mg total) by mouth daily.    Dispense:  30 tablet    Refill:  3   Medications Discontinued During This Encounter  Medication Reason  . amoxicillin-clavulanate (AUGMENTIN) 875-125 MG tablet Completed Course  . sildenafil (VIAGRA) 100 MG tablet Completed Course  . sildenafil (VIAGRA) 50 MG tablet Completed Course   No orders of the defined types were placed in this encounter.   Follow-up: No follow-ups on file.  Signed,  Elpidio Galea. Quintell Bonnin, MD   Outpatient Encounter Medications as of 10/19/2020  Medication Sig  . albuterol (VENTOLIN HFA) 108 (90 Base) MCG/ACT inhaler Inhale 1-2 puffs into the lungs every 4 (four) hours as needed for wheezing or shortness of breath.  . budesonide-formoterol (SYMBICORT) 160-4.5 MCG/ACT inhaler INHALE ONE PUFF INTO THE LUNGS TWICE A DAY. RINSE, GARGE, SPIT AFTER USE.  Marland Kitchen esomeprazole (NEXIUM) 20 MG capsule TAKE 1 CAPSULE BY MOUTH ONCE DAILY  . hydrochlorothiazide (HYDRODIURIL) 12.5 MG tablet Take 1 tablet (12.5 mg total) by mouth daily.  . meloxicam (MOBIC) 15 MG tablet Take 1 tablet (15 mg total) by mouth daily.  . montelukast (SINGULAIR) 10 MG tablet Take 1 tablet (10 mg total)  by mouth at bedtime.  . Multiple Vitamin (MULTIVITAMIN) tablet Take 1 tablet by mouth daily.  . [DISCONTINUED] amoxicillin-clavulanate (AUGMENTIN) 875-125 MG tablet Take 1 tablet by mouth 2 (two) times daily.  . [DISCONTINUED] sildenafil (VIAGRA) 100 MG tablet Take 0.5-1 tablets (50-100 mg total) by mouth daily as needed for erectile dysfunction.  . [DISCONTINUED] sildenafil (VIAGRA) 50 MG tablet    No facility-administered encounter medications on file as of 10/19/2020.

## 2020-11-16 MED ORDER — LISINOPRIL-HYDROCHLOROTHIAZIDE 10-12.5 MG PO TABS: 1.0000 | ORAL_TABLET | Freq: Every day | ORAL | 3 refills | Status: DC

## 2020-11-17 DIAGNOSIS — M4602 Spinal enthesopathy, cervical region: Secondary | ICD-10-CM | POA: Diagnosis not present

## 2020-11-17 DIAGNOSIS — M9906 Segmental and somatic dysfunction of lower extremity: Secondary | ICD-10-CM | POA: Diagnosis not present

## 2020-11-17 DIAGNOSIS — M62830 Muscle spasm of back: Secondary | ICD-10-CM | POA: Diagnosis not present

## 2020-11-17 DIAGNOSIS — M9901 Segmental and somatic dysfunction of cervical region: Secondary | ICD-10-CM | POA: Diagnosis not present

## 2020-12-14 MED ORDER — LISINOPRIL-HYDROCHLOROTHIAZIDE 20-12.5 MG PO TABS: 1.0000 | ORAL_TABLET | Freq: Every day | ORAL | 3 refills | Status: DC

## 2020-12-14 NOTE — Addendum Note (Signed)
Addended by: Hannah Beat on: 12/14/2020 08:42 AM   Modules accepted: Orders

## 2021-01-26 ENCOUNTER — Other Ambulatory Visit: Payer: Self-pay

## 2021-01-26 ENCOUNTER — Ambulatory Visit: Payer: BC Managed Care – PPO | Admitting: Medical

## 2021-01-26 VITALS — BP 100/62 | HR 75 | Temp 98.0°F | Resp 16

## 2021-01-26 DIAGNOSIS — L089 Local infection of the skin and subcutaneous tissue, unspecified: Secondary | ICD-10-CM

## 2021-01-26 NOTE — Progress Notes (Signed)
   Subjective:    Patient ID: Antonio Lara, male    DOB: 1975-10-25, 45 y.o.   MRN: [id number]  HPI  45 yo male in non acute distress presents with insect bite x   2 -3 weeks , it has not really gotten worse.Restaurant manager, [employer] food. He  Does not recall getting bite.  No itching, Slight tender with palpation. He just would like it checked..   Review of Systems  Constitutional: Negative for chills and fever.  Skin: Positive for color change.       Objective:   Physical Exam   Approximately 1 cm circular area of redness on medial side of right upper arm. No discharge, localized erythema and no axillary adenopathy      Assessment & Plan:  Insect bite Right upper arm Clean twice daily with dilute soap and water, neosporin to the site and cover with a  Bandage. Reviewed S/S of worsening infection and to call the clinic and I will start him on antibiotics. Washed and dressed in clinic. Return to the clinic as needed. He verbalizes understanding and has no questions at discharge.

## 2021-01-26 NOTE — Patient Instructions (Signed)

## 2021-01-27 DIAGNOSIS — M9906 Segmental and somatic dysfunction of lower extremity: Secondary | ICD-10-CM | POA: Diagnosis not present

## 2021-01-27 DIAGNOSIS — M9901 Segmental and somatic dysfunction of cervical region: Secondary | ICD-10-CM | POA: Diagnosis not present

## 2021-01-27 DIAGNOSIS — M4602 Spinal enthesopathy, cervical region: Secondary | ICD-10-CM | POA: Diagnosis not present

## 2021-01-27 DIAGNOSIS — M62830 Muscle spasm of back: Secondary | ICD-10-CM | POA: Diagnosis not present

## 2021-02-08 ENCOUNTER — Telehealth: Payer: Self-pay | Admitting: Family Medicine

## 2021-02-08 NOTE — Telephone Encounter (Signed)
Please schedule CPE with fasting labs for Dr. Copland. 

## 2021-02-09 ENCOUNTER — Telehealth: Payer: BC Managed Care – PPO | Admitting: Medical

## 2021-02-09 ENCOUNTER — Other Ambulatory Visit: Payer: Self-pay

## 2021-02-09 ENCOUNTER — Other Ambulatory Visit: Payer: BC Managed Care – PPO

## 2021-02-09 DIAGNOSIS — M62830 Muscle spasm of back: Secondary | ICD-10-CM

## 2021-02-09 DIAGNOSIS — M791 Myalgia, unspecified site: Secondary | ICD-10-CM

## 2021-02-09 DIAGNOSIS — R059 Cough, unspecified: Secondary | ICD-10-CM

## 2021-02-09 DIAGNOSIS — U071 COVID-19: Secondary | ICD-10-CM

## 2021-02-09 DIAGNOSIS — R112 Nausea with vomiting, unspecified: Secondary | ICD-10-CM

## 2021-02-09 DIAGNOSIS — Z8709 Personal history of other diseases of the respiratory system: Secondary | ICD-10-CM

## 2021-02-09 LAB — COMPREHENSIVE METABOLIC PANEL
ALT: 45 IU/L — ABNORMAL HIGH (ref 0–44)
AST: 30 IU/L (ref 0–40)
Albumin/Globulin Ratio: 2.1 (ref 1.2–2.2)
Albumin: 5.1 g/dL — ABNORMAL HIGH (ref 4.0–5.0)
Alkaline Phosphatase: 115 IU/L (ref 44–121)
BUN/Creatinine Ratio: 13 (ref 9–20)
BUN: 14 mg/dL (ref 6–24)
Bilirubin Total: 0.6 mg/dL (ref 0.0–1.2)
CO2: 24 mmol/L (ref 20–29)
Calcium: 9.8 mg/dL (ref 8.7–10.2)
Chloride: 99 mmol/L (ref 96–106)
Creatinine, Ser: 1.11 mg/dL (ref 0.76–1.27)
Globulin, Total: 2.4 g/dL (ref 1.5–4.5)
Glucose: 156 mg/dL — ABNORMAL HIGH (ref 65–99)
Potassium: 3.7 mmol/L (ref 3.5–5.2)
Sodium: 140 mmol/L (ref 134–144)
Total Protein: 7.5 g/dL (ref 6.0–8.5)
eGFR: 84 mL/min/{1.73_m2} (ref >59)

## 2021-02-09 MED ORDER — BENZONATATE 100 MG PO CAPS: ORAL_CAPSULE | ORAL | 0 refills | Status: DC

## 2021-02-09 MED ORDER — PREDNISONE 10 MG (21) PO TBPK: ORAL_TABLET | ORAL | 0 refills | Status: DC

## 2021-02-09 MED ORDER — CYCLOBENZAPRINE HCL 5 MG PO TABS: 5.0000 mg | ORAL_TABLET | Freq: Three times a day (TID) | ORAL | 1 refills | Status: DC | PRN

## 2021-02-09 MED ORDER — ONDANSETRON 4 MG PO TBDP: 4.0000 mg | ORAL_TABLET | Freq: Three times a day (TID) | ORAL | 0 refills | Status: DC | PRN

## 2021-02-09 NOTE — Patient Instructions (Signed)
Bland Diet A bland diet consists of foods that are often soft and do not have a lot of fat, fiber, or extra seasonings. Foods without fat, fiber, or seasoning are easier for the body to digest. They are also less likely to irritate your mouth, throat, stomach, and other parts of your digestive system. A bland dietis sometimes called a BRAT diet. What is my plan? Your health care provider or food and nutrition specialist (dietitian) may recommend specific changes to your diet to prevent symptoms or to treat your symptoms. These changes may include: Eating small meals often. Cooking food until it is soft enough to chew easily. Chewing your food well. Drinking fluids slowly. Not eating foods that are very spicy, sour, or fatty. Not eating citrus fruits, such as oranges and grapefruit. What do I need to know about this diet? Eat a variety of foods from the bland diet food list. Do not follow a bland diet longer than needed. Ask your health care provider whether you should take vitamins or supplements. What foods can I eat? Grains  Hot cereals, such as cream of wheat. Rice. Bread, crackers, or tortillas madefrom refined white flour. Vegetables Canned or cooked vegetables. Mashed or boiled potatoes. Fruits  Bananas. Applesauce. Other types of cooked or canned fruit with the skin andseeds removed, such as canned peaches or pears. Meats and other proteins  Scrambled eggs. Creamy peanut butter or other nut butters. Lean, well-cookedmeats, such as chicken or fish. Tofu. Soups or broths. Dairy Low-fat dairy products, such as milk, cottage cheese, or yogurt. Beverages  Water. Herbal tea. Apple juice. Fats and oils Mild salad dressings. Canola or olive oil. Sweets and desserts Pudding. Custard. Fruit gelatin. Ice cream. The items listed above may not be a complete list of recommended foods and beverages. Contact a dietitian for more options. What foods are not recommended? Grains Whole grain  breads and cereals. Vegetables Raw vegetables. Fruits Raw fruits, especially citrus, berries, or dried fruits. Dairy Whole fat dairy foods. Beverages Caffeinated drinks. Alcohol. Seasonings and condiments Strongly flavored seasonings or condiments. Hot sauce. Salsa. Other foods Spicy foods. Fried foods. Sour foods, such as pickled or fermented foods. Foodswith high sugar content. Foods high in fiber. The items listed above may not be a complete list of foods and beverages to avoid. Contact a dietitian for more information. Summary A bland diet consists of foods that are often soft and do not have a lot of fat, fiber, or extra seasonings. Foods without fat, fiber, or seasoning are easier for the body to digest. Check with your health care provider to see how long you should follow this diet plan. It is not meant to be followed for long periods. This information is not intended to replace advice given to you by your health care provider. Make sure you discuss any questions you have with your healthcare provider. Document Revised: 09/06/2017 Document Reviewed: 09/06/2017 Elsevier Patient Education  2022 Else[employer] Inc[employer] COVID-19: What to Do if You Are Sick CDC has updated isolation and quarantine recommendations for the public, and is revising the CDC website to reflect these changes. These recommendations do not apply to healthcare personnel and do not supersede state, local, tribal, or territorial laws, rules, andregulations. If you have a fever, cough or other symptoms, you might have COVID-19. Most people have mild illness and are able to recover at home. If you are sick: Keep track of your symptoms. If you have an emergency warning sign (including trouble breathing), call 911. Steps  to help prevent the spread of COVID-19 if you are sick If you are sick with COVID-19 or think you might have COVID-19, follow the steps below to care for yourself and to help protect other peoplein your home  and community. Stay home except to get medical care Stay home. Most people with COVID-19 have mild illness and can recover at home without medical care. Do not leave your home, except to get medical care. Do not visit public areas. Take care of yourself. Get rest and stay hydrated. Take over-the-counter medicines, such as acetaminophen, to help you feel better. Stay in touch with your doctor. Call before you get medical care. Be sure to get care if you have trouble breathing, or have any other emergency warning signs, or if you think it is an emergency. Avoid public transportation, ride-sharing, or taxis. Separate yourself from other people As much as possible, stay in a specific room and away from other people and pets in your home. If possible, you should use a separate bathroom. If you need to be around other people or animals in oroutside of the home, wear a mask. Tell your close contactsthat they may have been exposed to COVID-19. An infected person can spread COVID-19 starting 48 hours (or 2 days) before the person has any symptoms or tests positive. By letting your close contacts know they may have been exposed to COVID-19, you are helping to protect everyone. Additional guidance is available for those living in close quarters and shared housing. See COVID-19 and Animals if you have questions about pets. If you are diagnosed with COVID-19, someone from the health department may call you. Answer the call to slow the spread. Monitor your symptoms Symptoms of COVID-19 include fever, cough, or other symptoms. Follow care instructions from your healthcare provider and local health department. Your local health authorities may give instructions on checking your symptoms and reporting information. When to seek emergency medical attention Look for emergency warning signs* for COVID-19. If someone is showing any of these signs, seek emergency medical care immediately: Trouble breathing Persistent  pain or pressure in the chest New confusion Inability to wake or stay awake Pale, gray, or blue-colored skin, lips, or nail beds, depending on skin tone *This list is not all possible symptoms. Please call your medical provider forany other symptoms that are severe or concerning to you. Call 911 or call ahead to your local emergency facility: Notify the operator that you are seeking care for someone who has or may haveCOVID-19. Call ahead before visiting your doctor Call ahead. Many medical visits for routine care are being postponed or done by phone or telemedicine. If you have a medical appointment that cannot be postponed, call your doctor's office, and tell them you have or may have COVID-19. This will help the office protect themselves and other patients. Get tested If you have symptoms of COVID-19, get tested. While waiting for test results, you stay away from others, including staying apart from those living in your household. Self-tests are one of several options for testing for the virus that causes COVID-19 and may be more convenient than laboratory-based tests and point-of-care tests. Ask your healthcare provider or your local health department if you need help interpreting your test results. You can visit your state, tribal, local, and territorial health department's website to look for the latest local information on testing sites. If you are sick, wear a mask over your nose and mouth You should wear a mask over your nose and  mouth if you must be around other people or animals, including pets (even at home). You don't need to wear the mask if you are alone. If you can't put on a mask (because of trouble breathing, for example), cover your coughs and sneezes in some other way. Try to stay at least 6 feet away from other people. This will help protect the people around you. Masks should not be placed on young children under age 52 years, anyone who has trouble breathing, or anyone who is  not able to remove the mask without help. Note: During the COVID-19 pandemic, medical grade facemasks are reserved forhealthcare workers and some first responders. Cover your coughs and sneezes Cover your mouth and nose with a tissue when you cough or sneeze. Throw away used tissues in a lined trash can. Immediately wash your hands with soap and water for at least 20 seconds. If soap and water are not available, clean your hands with an alcohol-based hand sanitizer that contains at least 60% alcohol. Clean your hands often Wash your hands often with soap and water for at least 20 seconds. This is especially important after blowing your nose, coughing, or sneezing; going to the bathroom; and before eating or preparing food. Use hand sanitizer if soap and water are not available. Use an alcohol-based hand sanitizer with at least 60% alcohol, covering all surfaces of your hands and rubbing them together until they feel dry. Soap and water are the best option, especially if hands are visibly dirty. Avoid touching your eyes, nose, and mouth with unwashed hands. Handwashing Tips Avoid sharing personal household items Do not share dishes, drinking glasses, cups, eating utensils, towels, or bedding with other people in your home. Wash these items thoroughly after using them with soap and water or put in the dishwasher. Clean all "high-touch" surfaces every day Clean and disinfect high-touch surfaces in your "sick room" and bathroom; wear disposable gloves. Let someone else clean and disinfect surfaces in common areas, but you should clean your bedroom and bathroom, if possible. If a caregiver or other person needs to clean and disinfect a sick person's bedroom or bathroom, they should do so on an as-needed basis. The caregiver/other person should wear a mask and disposable gloves prior to cleaning. They should wait as long as possible after the person who is sick has used the bathroom before coming in to  clean and use the bathroom. High-touch surfaces include phones, remote controls, counters, tabletops, doorknobs, bathroom fixtures, toilets, keyboards, tablets, and bedside tables. Clean and disinfect areas that may have blood, stool, or body fluids on them. Use household cleaners and disinfectants. Clean the area or item with soap and water or another detergent if it is dirty. Then, use a household disinfectant. Be sure to follow the instructions on the label to ensure safe and effective use of the product. Many products recommend keeping the surface wet for several minutes to ensure germs are killed. Many also recommend precautions such as wearing gloves and making sure you have good ventilation during use of the product. Use a product from [employer] List N: Disinfectants for Coronavirus (COVID-19). Complete Disinfection Guidance When you can be around others after being sick with COVID-19 Deciding when you can be around others is different for different situations. Find out when you can safely end home isolation. For any additional questions about your care,contact your healthcare provider or state or local health department. 07/29/2020 Content source: [facility] ([facility]), Division of  Viral Diseases This information is not intended to replace advice given to you by your health care provider. Make sure you discuss any questions you have with your healthcare provider. Document Revised: 09/25/2020 Document Reviewed: 09/25/2020 Elsevier Patient Education  2022 [employer].

## 2021-02-09 NOTE — Telephone Encounter (Signed)
Spoke with patient scheduled CPE with fasting labs 

## 2021-02-09 NOTE — Progress Notes (Signed)
   Subjective:    Patient ID: Antonio Lara, male    DOB: 30-Jun-1976, 45 y.o.   MRN: [id number]  HPI 45 yo male in non acute distress consents to telemedincine appointment.  He started not feeling well on 02/06/21., He started with diarrhea the next  2 days he had ST,  then last night he had bodyaches. ,Coughing which is causing the back to spasm., some shortness of breath with talkilng. He tested this morning and he was positive for Covid-19.  There were no vitals taken for this visit.    Allergies  Allergen Reactions   Oxycodone-Acetaminophen     REACTION: extemely dizzy with nausea and vomiting.    Review of Systems  Constitutional:  Positive for chills, fatigue and fever.  HENT:  Positive for congestion, postnasal drip, rhinorrhea and sore throat.   Respiratory:  Positive for cough.   Gastrointestinal:  Positive for abdominal pain and diarrhea.  Musculoskeletal:  Positive for myalgias.      Objective:   Physical Exam Constitutional:      Appearance: He is ill-appearing.  Neurological:     Mental Status: He is alert.   No physical exam due to telemedicine appointment.     Patient ill  looking,  AXOX3, Throat slightly red mostly pale. Given Paxlovid information. eGFR 84  Diagnosis Covid infection CMP to check kidney for  patient , so that patient can  take  Paxlovid. Monoclonal antiodies are not available shortage in [place]. Isolate from family, rest, increase fluids, monitor fever , take OTC Motrin or Tylenol for fever or pain. Bland diet. Got pale after blood draw. Resolved shortly afterwards. eGFR is 84. He has a primary care doctor so I will defer to him about the prescription for  Paxlovid. Patient verbalizes understanding and has no questions at the end of our conversation.

## 2021-02-10 ENCOUNTER — Ambulatory Visit: Payer: BC Managed Care – PPO | Admitting: Internal Medicine

## 2021-02-12 ENCOUNTER — Other Ambulatory Visit: Payer: Self-pay | Admitting: Family Medicine

## 2021-02-12 DIAGNOSIS — Z131 Encounter for screening for diabetes mellitus: Secondary | ICD-10-CM

## 2021-02-12 DIAGNOSIS — Z79899 Other long term (current) drug therapy: Secondary | ICD-10-CM

## 2021-02-12 DIAGNOSIS — Z13220 Encounter for screening for lipoid disorders: Secondary | ICD-10-CM

## 2021-02-15 ENCOUNTER — Ambulatory Visit
Admission: RE | Admit: 2021-02-15 | Discharge: 2021-02-15 | Disposition: A | Discharge status: Home / Self Care | Payer: BC Managed Care – PPO | Source: Ambulatory Visit | Attending: Medical | Admitting: Medical

## 2021-02-15 ENCOUNTER — Other Ambulatory Visit: Payer: Self-pay

## 2021-02-15 ENCOUNTER — Ambulatory Visit
Admission: RE | Admit: 2021-02-15 | Discharge: 2021-02-15 | Disposition: A | Discharge status: Home / Self Care | Payer: BC Managed Care – PPO | Attending: Medical | Admitting: Medical

## 2021-02-15 ENCOUNTER — Telehealth: Payer: BC Managed Care – PPO | Admitting: Medical

## 2021-02-15 DIAGNOSIS — R059 Cough, unspecified: Secondary | ICD-10-CM

## 2021-02-15 DIAGNOSIS — J9801 Acute bronchospasm: Secondary | ICD-10-CM

## 2021-02-15 DIAGNOSIS — Z8709 Personal history of other diseases of the respiratory system: Secondary | ICD-10-CM

## 2021-02-15 DIAGNOSIS — R6883 Chills (without fever): Secondary | ICD-10-CM | POA: Diagnosis not present

## 2021-02-15 DIAGNOSIS — R0602 Shortness of breath: Secondary | ICD-10-CM | POA: Diagnosis not present

## 2021-02-15 DIAGNOSIS — U071 COVID-19: Secondary | ICD-10-CM

## 2021-02-15 MED ORDER — ALBUTEROL SULFATE HFA 108 (90 BASE) MCG/ACT IN AERS: 1.0000 | INHALATION_SPRAY | RESPIRATORY_TRACT | 2 refills | Status: DC | PRN

## 2021-02-15 MED ORDER — PREDNISONE 10 MG (21) PO TBPK: ORAL_TABLET | ORAL | 0 refills | Status: DC

## 2021-02-15 MED ORDER — AZITHROMYCIN 250 MG PO TABS: ORAL_TABLET | ORAL | 0 refills | Status: AC

## 2021-02-15 NOTE — Progress Notes (Signed)
   Subjective:    Patient ID: Antonio Lara, male    DOB: 09-04-1975, 44 y.o.   MRN: [id number]  HPI 45 yo male in non acute distress, consents to telemedicine appointment. Coughing( green productive muscus), shortness of breath on exertion ( tried to do yard work this last weekend) but got short of breath. He has had chills and fatigue. Dx on 02/09/2021 with Covid-19. Patient with a history of Asthma.  Voice now hoarse Vaccinated  J&J  Review of Systems  Constitutional:  Positive for chills and fatigue. Negative for fever.  Respiratory:  Positive for cough, shortness of breath and wheezing.   Cardiovascular:  Negative for chest pain.      Objective:   Physical Exam AXOX3, hoarse sounding. No physical exam completed due to telemedicine appointment. No distress noted on phone call.     Assessment & Plan:  Covid-19 infection Cough/  shortness fo breath, chills  H/O Asthma CHEST Wheezing has been using expired Albuterol  (fall of  2021.X-ray resulted see below. Meds ordered this encounter  Medications   albuterol (VENTOLIN HFA) 108 (90 Base) MCG/ACT inhaler    Sig: Inhale 1-2 puffs into the lungs every 4 (four) hours as needed for wheezing or shortness of breath.    Dispense:  18 g    Refill:  2   predniSONE (STERAPRED UNI-PAK 21 TAB) 10 MG (21) TBPK tablet    Sig: Take  6 tablets by mouth today then 5 tablets tomorroe then one less tablet every day thereafter. Take with food.    Dispense:  21 tablet    Refill:  0   CXR reviewed with patient  WNL, will recheck on patient on Wednesday at  9:30 am. He verbalizes understanding and has no questions at discharge. Meds ordered this encounter  Medications   albuterol (VENTOLIN HFA) 108 (90 Base) MCG/ACT inhaler    Sig: Inhale 1-2 puffs into the lungs every 4 (four) hours as needed for wheezing or shortness of breath.    Dispense:  18 g    Refill:  2   predniSONE (STERAPRED UNI-PAK 21 TAB) 10 MG (21) TBPK tablet    Sig: Take  6  tablets by mouth today then 5 tablets tomorroe then one less tablet every day thereafter. Take with food.    Dispense:  21 tablet    Refill:  0   azithromycin (ZITHROMAX) 250 MG tablet    Sig: Take 2 tablets on day 1, then 1 tablet daily on days 2 through 5    Dispense:  6 tablet    Refill:  0    Narrative & Impression  CLINICAL DATA:  Cough and shortness of breath, history of prior COVID-19   EXAM: CHEST - 2 VIEW   COMPARISON:  08/25/2016   FINDINGS: The heart size and mediastinal contours are within normal limits. Both lungs are clear. The visualized skeletal structures are unremarkable.   IMPRESSION: No active cardiopulmonary disease.     Electronically Signed   By: Alcide Clever M.D.   On: 02/15/2021 13:29

## 2021-02-17 ENCOUNTER — Other Ambulatory Visit: Payer: Self-pay

## 2021-02-17 ENCOUNTER — Telehealth: Payer: BC Managed Care – PPO | Admitting: Medical

## 2021-02-17 NOTE — Progress Notes (Signed)
   Subjective:    Patient ID: Antonio Lara, male    DOB: [birth year], 45 y.o.   MRN: [id number]  HPI 45 yo male in non acute distress for follow up consents to telemedicine appointment. "Felling much better" even compared to yesterday. He was placed on Azithromycin  2 days ago due to coughing up green mucus, he says he is coughing up a lot less and only once in a while is it green. He still feels short of breath on exertion. CXR was wnl.    Review of Systems Denies fever or chills has been sweating at night but no registration of a temperature on thermometer. Eat some, no as much as normal , BM are wnl per patient.    Objective:   Physical Exam NAD Voice wnl AXOX3 No physical exam was performed due to telemedicine appointment. He sounds much more energetic today.       Assessment & Plan:  Covid-19 infection improving Hopefully he will continue to improve and can return to work on Friday July 1st. If he is SOB, wheezing ,  or coughing he is to call this office for an updated work note.  He is currently scheduled to work Friday , Saturday and Sunday July 1-3rd. 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 He verbalizes understanding and has no questions at the end of our conversation.

## 2021-02-23 ENCOUNTER — Other Ambulatory Visit (INDEPENDENT_AMBULATORY_CARE_PROVIDER_SITE_OTHER): Payer: BC Managed Care – PPO

## 2021-02-23 ENCOUNTER — Other Ambulatory Visit: Payer: Self-pay

## 2021-02-23 DIAGNOSIS — Z79899 Other long term (current) drug therapy: Secondary | ICD-10-CM | POA: Diagnosis not present

## 2021-02-23 DIAGNOSIS — Z131 Encounter for screening for diabetes mellitus: Secondary | ICD-10-CM

## 2021-02-23 DIAGNOSIS — Z13220 Encounter for screening for lipoid disorders: Secondary | ICD-10-CM | POA: Diagnosis not present

## 2021-02-23 LAB — CBC WITH DIFFERENTIAL/PLATELET
Basophils Absolute: 0 10*3/uL (ref 0.0–0.1)
Basophils Relative: 0.3 % (ref 0.0–3.0)
Eosinophils Absolute: 0.1 10*3/uL (ref 0.0–0.7)
Eosinophils Relative: 1.4 % (ref 0.0–5.0)
HCT: 40 % (ref 39.0–52.0)
Hemoglobin: 13.8 g/dL (ref 13.0–17.0)
Lymphocytes Relative: 27.8 % (ref 12.0–46.0)
Lymphs Abs: 2.9 10*3/uL (ref 0.7–4.0)
MCHC: 34.6 g/dL (ref 30.0–36.0)
MCV: 83.1 fl (ref 78.0–100.0)
Monocytes Absolute: 0.7 10*3/uL (ref 0.1–1.0)
Monocytes Relative: 6.7 % (ref 3.0–12.0)
Neutro Abs: 6.7 10*3/uL (ref 1.4–7.7)
Neutrophils Relative %: 63.8 % (ref 43.0–77.0)
Platelets: 252 10*3/uL (ref 150.0–400.0)
RBC: 4.81 Mil/uL (ref 4.22–5.81)
RDW: 13.8 % (ref 11.5–15.5)
WBC: 10.6 10*3/uL — ABNORMAL HIGH (ref 4.0–10.5)

## 2021-02-23 LAB — LIPID PANEL
Cholesterol: 213 mg/dL — ABNORMAL HIGH (ref 0–200)
HDL: 45.4 mg/dL (ref >39.00)
NonHDL: 167.76
Total CHOL/HDL Ratio: 5
Triglycerides: 249 mg/dL — ABNORMAL HIGH (ref 0.0–149.0)
VLDL: 49.8 mg/dL — ABNORMAL HIGH (ref 0.0–40.0)

## 2021-02-23 LAB — LDL CHOLESTEROL, DIRECT: Direct LDL: 128 mg/dL

## 2021-02-23 LAB — BASIC METABOLIC PANEL
BUN: 13 mg/dL (ref 6–23)
CO2: 30 mEq/L (ref 19–32)
Calcium: 9.1 mg/dL (ref 8.4–10.5)
Chloride: 99 mEq/L (ref 96–112)
Creatinine, Ser: 1.04 mg/dL (ref 0.40–1.50)
GFR: 88.11 mL/min (ref >60.00)
Glucose, Bld: 152 mg/dL — ABNORMAL HIGH (ref 70–99)
Potassium: 3.3 mEq/L — ABNORMAL LOW (ref 3.5–5.1)
Sodium: 139 mEq/L (ref 135–145)

## 2021-02-23 LAB — HEPATIC FUNCTION PANEL
ALT: 85 U/L — ABNORMAL HIGH (ref 0–53)
AST: 27 U/L (ref 0–37)
Albumin: 4.3 g/dL (ref 3.5–5.2)
Alkaline Phosphatase: 83 U/L (ref 39–117)
Bilirubin, Direct: 0.1 mg/dL (ref 0.0–0.3)
Total Bilirubin: 0.6 mg/dL (ref 0.2–1.2)
Total Protein: 6.7 g/dL (ref 6.0–8.3)

## 2021-02-23 LAB — HEMOGLOBIN A1C: Hgb A1c MFr Bld: 6.7 % — ABNORMAL HIGH (ref 4.6–6.5)

## 2021-03-04 ENCOUNTER — Ambulatory Visit (INDEPENDENT_AMBULATORY_CARE_PROVIDER_SITE_OTHER): Payer: BC Managed Care – PPO | Admitting: Family Medicine

## 2021-03-04 ENCOUNTER — Other Ambulatory Visit: Payer: Self-pay

## 2021-03-04 ENCOUNTER — Encounter: Payer: Self-pay | Admitting: Family Medicine

## 2021-03-04 VITALS — BP 110/76 | HR 118 | Temp 98.1°F | Ht 72.5 in | Wt 190.0 lb

## 2021-03-04 DIAGNOSIS — E119 Type 2 diabetes mellitus without complications: Secondary | ICD-10-CM

## 2021-03-04 DIAGNOSIS — R000 Tachycardia, unspecified: Secondary | ICD-10-CM

## 2021-03-04 DIAGNOSIS — R002 Palpitations: Secondary | ICD-10-CM | POA: Diagnosis not present

## 2021-03-04 DIAGNOSIS — I4510 Unspecified right bundle-branch block: Secondary | ICD-10-CM

## 2021-03-04 DIAGNOSIS — Z0001 Encounter for general adult medical examination with abnormal findings: Secondary | ICD-10-CM

## 2021-03-04 DIAGNOSIS — Z0000 Encounter for general adult medical examination without abnormal findings: Secondary | ICD-10-CM

## 2021-03-04 DIAGNOSIS — E785 Hyperlipidemia, unspecified: Secondary | ICD-10-CM

## 2021-03-04 HISTORY — DX: Type 2 diabetes mellitus without complications: E11.9

## 2021-03-04 MED ORDER — ROSUVASTATIN CALCIUM 10 MG PO TABS: 10.0000 mg | ORAL_TABLET | Freq: Every day | ORAL | 3 refills | Status: DC

## 2021-03-04 NOTE — Progress Notes (Signed)
Antonio Uzelac T. [name], MD, CAQ Sports Medicine [facility] at [facility] [address] [place], [address]  Phone: [phone]  FAX: [phone]  Antonio Lara - 45 y.o. male  MRN [id number]  Date of Birth: 28-Sep-1975  Date: 03/04/2021  PCP: Hannah Beat, MD  Referral: Hannah Beat, MD  Chief Complaint  Patient presents with   Annual Exam    This visit occurred during the SARS-CoV-2 public health emergency.  Safety protocols were in place, including screening questions prior to the visit, additional usage of staff PPE, and extensive cleaning of exam room while observing appropriate contact time as indicated for disinfecting solutions.   Patient Care Team: Hannah Beat, MD as PCP - General Subjective:   Antonio Lara is a 45 y.o. pleasant patient who presents with the following:  Preventative Health Maintenance Visit as well as several new onset problems.  Health Maintenance Summary Reviewed and updated, unless pt declines services.  Tobacco History Reviewed. Alcohol: No concerns, no excessive use - many months since Exercise Habits: Some activity, rec at least 30 mins 5 times a week.  Had been walking one to ten miles a day.   Will go to a gym sometimes, too.   STD concerns: no risk or activity to increase risk Drug Use: None  Review labs Covid vaccines booster  Pred did seem to help with Covid - got it a couple of weeks again x 2 pred doses - lung spasm is not that bad.  Not much wheezing.   Some family members with A Fib  He presents today and he has been having some tachycardia and its been anywhere from 110 to occasional 140.  He shows me some of these readings on his apple watch.  When I examined him he does have a pulse at about 100-1 10. Is not having any focal chest pain. He does have high blood pressure, high cholesterol, and he has new onset diabetes diagnosed today. No prior history of cardiac issues or prior  stroke.  Lab Results  Component Value Date   HGBA1C 6.7 (H) 02/23/2021    He does have new onset diabetes type 2.  This is the first abnormality from a blood sugar standpoint or hemoglobin A1c that he has had.  Not taking any medication. Body mass index is 25.41 kg/m.  Generally, he does eat well.  He has a strong family history of diabetes as well. He is open to doing some diabetic education.  Nutritionist through wellness clinic [phone]    Health Maintenance  Topic Date Due   FOOT EXAM  Never done   OPHTHALMOLOGY EXAM  Never done   HIV Screening  Never done   Hepatitis C Screening  Never done   Pneumococcal Vaccine 70-41 Years old (2 - PCV) 07/08/2016   COVID-19 Vaccine (2 - Janssen risk series) 01/29/2020   INFLUENZA VACCINE  03/22/2021   HEMOGLOBIN A1C  08/26/2021   TETANUS/TDAP  07/14/2029   PNEUMOCOCCAL POLYSACCHARIDE VACCINE AGE 52-64 HIGH RISK  Completed   HPV VACCINES  Aged Out   Immunization History  Administered Date(s) Administered   Influenza,inj,Quad PF,6+ Mos 07/09/2015, 05/29/2019   Influenza-Unspecified 05/13/2020   Janssen (J&J) SARS-COV-2 Vaccination 01/01/2020   Pneumococcal Polysaccharide-23 07/09/2015   Td 08/22/1996, 03/31/2009   Tdap 07/15/2019   Patient Active Problem List   Diagnosis Date Noted   Diabetes mellitus type 2, diet-controlled (HCC) 03/04/2021    Priority: High   Hyperlipidemia LDL goal <70 03/05/2021  Priority: Medium   Hypertension, essential 10/19/2020    Priority: Medium   Moderate persistent asthma with exacerbation 03/21/2019    Priority: Medium   GERD 07/10/2009   Allergic rhinitis 04/08/2008    Past Medical History:  Diagnosis Date   Diabetes mellitus type 2, diet-controlled (HCC) 03/04/2021   GERD (gastroesophageal reflux disease)    Hyperlipidemia LDL goal <70 03/05/2021   Mild obstructive sleep apnea, controlled with dental appliance    Moderate persistent asthma     History reviewed. No pertinent  surgical history.  Family History  Problem Relation Age of Onset   Breast cancer Mother    Stroke Father    Cancer Maternal Grandfather     Past Medical History, Surgical History, Social History, Family History, Problem List, Medications, and Allergies have been reviewed and updated if relevant.  Review of Systems: Pertinent positives are listed above.  Otherwise, a full 14 point review of systems has been done in full and it is negative except where it is noted positive.  Objective:   BP 110/76   Pulse (!) 118   Temp 98.1 F (36.7 C) (Temporal)   Ht 6' 0.5" (1.842 m)   Wt 190 lb (86.2 kg)   SpO2 99%   BMI 25.41 kg/m  Ideal Body Weight: Weight in (lb) to have BMI = 25: 186.5  Ideal Body Weight: Weight in (lb) to have BMI = 25: 186.5 No results found. Depression screen [facility] 2/9 03/04/2021 07/15/2019  Decreased Interest 0 0  Down, Depressed, Hopeless 0 0  PHQ - 2 Score 0 0     GEN: well developed, well nourished, no acute distress Eyes: conjunctiva and lids normal, PERRLA, EOMI ENT: TM clear, nares clear, oral exam WNL Neck: supple, no lymphadenopathy, no thyromegaly, no JVD Pulm: clear to auscultation and percussion, respiratory effort normal CV: PULSE 110, S1-S2, no murmur, rub or gallop, no bruits, peripheral pulses normal and symmetric, no cyanosis, clubbing, edema or varicosities GI: soft, non-tender; no hepatosplenomegaly, masses; active bowel sounds all quadrants GU: deferred Lymph: no cervical, axillary or inguinal adenopathy MSK: gait normal, muscle tone and strength WNL, no joint swelling, effusions, discoloration, crepitus  SKIN: clear, good turgor, color WNL, no rashes, lesions, or ulcerations Neuro: normal mental status, normal strength, sensation, and motion Psych: alert; oriented to person, place and time, normally interactive and not anxious or depressed in appearance.  All labs reviewed with patient. Results for orders placed or performed in visit on  02/23/21  Hemoglobin A1c  Result Value Ref Range   Hgb A1c MFr Bld 6.7 (H) 4.6 - 6.5 %  Basic metabolic panel  Result Value Ref Range   Sodium 139 135 - 145 mEq/L   Potassium 3.3 (L) 3.5 - 5.1 mEq/L   Chloride 99 96 - 112 mEq/L   CO2 30 19 - 32 mEq/L   Glucose, Bld 152 (H) 70 - 99 mg/dL   BUN 13 6 - 23 mg/dL   Creatinine, Ser 1.95 0.40 - 1.50 mg/dL   GFR 09.32 >67.12 mL/min   Calcium 9.1 8.4 - 10.5 mg/dL  Hepatic function panel  Result Value Ref Range   Total Bilirubin 0.6 0.2 - 1.2 mg/dL   Bilirubin, Direct 0.1 0.0 - 0.3 mg/dL   Alkaline Phosphatase 83 39 - 117 U/L   AST 27 0 - 37 U/L   ALT 85 (H) 0 - 53 U/L   Total Protein 6.7 6.0 - 8.3 g/dL   Albumin 4.3 3.5 - 5.2 g/dL  CBC with Differential/Platelet  Result Value Ref Range   WBC 10.6 (H) 4.0 - 10.5 K/uL   RBC 4.81 4.22 - 5.81 Mil/uL   Hemoglobin 13.8 13.0 - 17.0 g/dL   HCT 49.1 79.1 - 50.5 %   MCV 83.1 78.0 - 100.0 fl   MCHC 34.6 30.0 - 36.0 g/dL   RDW 69.7 94.8 - 01.6 %   Platelets 252.0 150.0 - 400.0 K/uL   Neutrophils Relative % 63.8 43.0 - 77.0 %   Lymphocytes Relative 27.8 12.0 - 46.0 %   Monocytes Relative 6.7 3.0 - 12.0 %   Eosinophils Relative 1.4 0.0 - 5.0 %   Basophils Relative 0.3 0.0 - 3.0 %   Neutro Abs 6.7 1.4 - 7.7 K/uL   Lymphs Abs 2.9 0.7 - 4.0 K/uL   Monocytes Absolute 0.7 0.1 - 1.0 K/uL   Eosinophils Absolute 0.1 0.0 - 0.7 K/uL   Basophils Absolute 0.0 0.0 - 0.1 K/uL  Lipid panel  Result Value Ref Range   Cholesterol 213 (H) 0 - 200 mg/dL   Triglycerides 553.7 (H) 0.0 - 149.0 mg/dL   HDL 48.27 >07.86 mg/dL   VLDL 75.4 (H) 0.0 - 49.2 mg/dL   Total CHOL/HDL Ratio 5    NonHDL 167.76   LDL cholesterol, direct  Result Value Ref Range   Direct LDL 128.0 mg/dL    Assessment and Plan:     ICD-10-CM   1. Healthcare maintenance  Z00.00     2. Diabetes mellitus type 2, diet-controlled (HCC)  E11.9 Ambulatory referral to diabetic education    3. Tachycardia  R00.0 EKG 12-Lead    Ambulatory  referral to Cardiology    4. RBBB  I45.10 Ambulatory referral to Cardiology    5. Palpitations  R00.2 Ambulatory referral to Cardiology    6. Hyperlipidemia LDL goal <70  E78.5      Global health maintenance, he is up-to-date. He does need his COVID-19 booster He will need some additional monitoring given his new onset type 2 diabetes.  Acute additional problems. Intermittent tachycardia with palpitations.  His pulse is between 100-118 in the office today.  He does show me his apple watch which has been running at times in the 110s, up to a maximum of 140.  He is not having any chest pain and never has had any chest pain.  No exertional chest pain.  No shortness of breath.  EKG: Tachycardic at 100. Normal axis, RBB at V2 and V3, No acute ST elevation or depression with non-specific T wave changes.  ? Q at lead III and avr.  With unexplained tachycardia, I do think that he needs to have this further evaluated by cardiology.  Appreciate their help and assistance.  New onset type 2 diabetes.  We reviewed pathophysiology of diabetes in general.  Since his hemoglobin A1c is at 6.7, for now we are not going to add any medication.  I refer him to nutrition. Nutritionist through wellness clinic [phone]  Additionally, in the setting of type 2 diabetes, his lipid goals have changed, now with a goal of less than 70.  We will initiate Crestor 10 mg.  The level of decision making in this case over and above health maintenance is moderate.  Health Maintenance Exam: The patient's preventative maintenance and recommended screening tests for an annual wellness exam were reviewed in full today. Brought up to date unless services declined.  Counselled on the importance of diet, exercise, and its role in overall health and mortality.  The patient's FH and SH was reviewed, including their home life, tobacco status, and drug and alcohol status.  Follow-up in 1 year for physical exam or additional  follow-up below.  Follow-up: No follow-ups on file. Or follow-up in 1 year if not noted.  Meds ordered this encounter  Medications   rosuvastatin (CRESTOR) 10 MG tablet    Sig: Take 1 tablet (10 mg total) by mouth daily.    Dispense:  90 tablet    Refill:  3    Medications Discontinued During This Encounter  Medication Reason   benzonatate (TESSALON PERLES) 100 MG capsule Completed Course   ondansetron (ZOFRAN ODT) 4 MG disintegrating tablet Completed Course   predniSONE (STERAPRED UNI-PAK 21 TAB) 10 MG (21) TBPK tablet Completed Course   Orders Placed This Encounter  Procedures   Ambulatory referral to Cardiology   Ambulatory referral to diabetic education   EKG 12-Lead     Signed,  Arvle Grabe T. Tunya Held, MD   Allergies as of 03/04/2021       Reactions   Oxycodone-acetaminophen    REACTION: extemely dizzy with nausea and vomiting.        Medication List        Accurate as of March 04, 2021 11:59 PM. If you have any questions, ask your nurse or doctor.          STOP taking these medications    benzonatate 100 MG capsule Commonly known as: LawyerTessalon Perles Stopped by: Hannah BeatSpencer Arhaan Chesnut, MD   ondansetron 4 MG disintegrating tablet Commonly known as: Zofran ODT Stopped by: Hannah BeatSpencer Areli Jowett, MD   predniSONE 10 MG (21) Tbpk tablet Commonly known as: STERAPRED UNI-PAK 21 TAB Stopped by: Hannah BeatSpencer Dovid Bartko, MD       TAKE these medications    albuterol 108 (90 Base) MCG/ACT inhaler Commonly known as: VENTOLIN HFA Inhale 1-2 puffs into the lungs every 4 (four) hours as needed for wheezing or shortness of breath.   budesonide-formoterol 160-4.5 MCG/ACT inhaler Commonly known as: Symbicort INHALE ONE PUFF INTO THE LUNGS TWICE A DAY. RINSE, GARGE, SPIT AFTER USE.   cyclobenzaprine 5 MG tablet Commonly known as: FLEXERIL Take 1 tablet (5 mg total) by mouth 3 (three) times daily as needed for muscle spasms.   esomeprazole 20 MG capsule Commonly known as:  NEXIUM TAKE 1 CAPSULE BY MOUTH ONCE DAILY   lisinopril-hydrochlorothiazide 20-12.5 MG tablet Commonly known as: Zestoretic Take 1 tablet by mouth daily.   montelukast 10 MG tablet Commonly known as: SINGULAIR Take 1 tablet (10 mg total) by mouth at bedtime.   multivitamin tablet Take 1 tablet by mouth daily.   rosuvastatin 10 MG tablet Commonly known as: Crestor Take 1 tablet (10 mg total) by mouth daily. Started by: Hannah BeatSpencer Kyrus Hyde, MD

## 2021-03-04 NOTE — Patient Instructions (Signed)
American Diabetes Association website   [facility] Low Glycemic Diet (Source: [facility], 2006)  Low Glycemic Foods (20-49) (Decrease risk of developing heart disease)  Best for Diabetes: Eat Mostly these  Breakfast Cereals: All-Bran All-Bran Fruit 'n Oats Fiber One Oatmeal (not instant) Oat bran  Fruits and fruit juices: (Limit to 1-2 servings per day) Apples Apricots (fresh & dried) Blackberries Blueberries Cherries Cranberries Peaches Pears Plums Prunes Grapefruit Raspberries Strawberries Tangerine  Juices: Apple juice Grapefruit juice Tomato juice  Beans and legumes (fresh-cooked): Black-eyed peas Butter beans Chick peas Lentils  Green beans Lima beans Kidney beans Navy beans Pinto beans Snow peas  Non-starchy vegetables: Asparagus, avocado, broccoli, cabbage, cauliflower, celery, cucumber, greens, lettuce, mushrooms, peppers, tomatoes, okra, onions, spinach, summer squash  Grains: Barley Bulgur Rye Wild rice  Nuts and oils : Almonds Peanuts Sunflower seeds Hazelnuts Pecans Walnuts Oils that are liquid at room temperature  Dairy, fish, meat, soy, and eggs: Milk, skim Lowfat cheese Yogurt, lowfat, fruit sugar sweetened Lean red meat Fish  Skinless chicken & [place] Shellfish Egg whites (up to 3 daily) Soy products  Egg yolks (up to 7 or _____ per week) Moderate Glycemic Foods (50-69)  OK sometimes with diabetes  Breakfast Cereals: Bran Buds Bran Chex Just Right Mini-Wheats  Special K Swiss muesli  Fruits: Banana (under-ripe) Dates Figs Grapes Kiwi Mango Oranges Raisins  Fruit Juices: Cranberry juice Orange juice  Beans and legumes: [place]-type baked beans Canned pinto, kidney, or navy beans Green peas  Vegetables: Beets Carrots  Sweet potato Yam Corn on the cob  Breads: Pita (pocket) bread Oat bran bread Pumpernickel bread Rye bread Wheat bread, high fiber   Grains: Cornmeal Rice, brown Rice, white  Couscous  Pasta: Macaroni Pizza, cheese Ravioli, meat filled Spaghetti, white   Nuts: Cashews Macadamia  Snacks: Chocolate Ice cream, lowfat Muffin Popcorn High Glycemic Foods (70-100)  Rare: Eat occaisionally with diabetes  THESE ARE THE WORST KIND OF FOODS FOR YOUR DIABETES  Breakfast Cereals: Cheerios Corn Chex Corn Flakes Cream of Wheat Grape Nuts Grape Nut Flakes Grits Nutri-Grain Puffed Rice Puffed Wheat Rice Chex Rice Krispies Shredded Wheat Team Total  Fruits: Pineapple Watermelon Banana (over-ripe) Beverages: Sodas, sweet tea, pineapple juice  Vegetables: Potato, baked, boiled, fried, mashed [place] fries Canned or frozen corn Parsnips Winter squash  Breads: Most breads (white and whole grain) Bagels Bread sticks Bread stuffing Kaiser roll Dinner rolls  Grains: Rice, instant Tapioca, with milk Candy and most cookies  Snacks: Donuts Corn chips Jelly beans Pretzels Pastries

## 2021-03-05 ENCOUNTER — Encounter: Payer: Self-pay | Admitting: Family Medicine

## 2021-03-05 DIAGNOSIS — E785 Hyperlipidemia, unspecified: Secondary | ICD-10-CM

## 2021-03-05 HISTORY — DX: Hyperlipidemia, unspecified: E78.5

## 2021-03-24 MED ORDER — METOPROLOL SUCCINATE ER 50 MG PO TB24: 50.0000 mg | ORAL_TABLET | Freq: Every day | ORAL | 2 refills | Status: DC

## 2021-03-30 DIAGNOSIS — M4602 Spinal enthesopathy, cervical region: Secondary | ICD-10-CM | POA: Diagnosis not present

## 2021-03-30 DIAGNOSIS — M9901 Segmental and somatic dysfunction of cervical region: Secondary | ICD-10-CM | POA: Diagnosis not present

## 2021-03-30 DIAGNOSIS — M9906 Segmental and somatic dysfunction of lower extremity: Secondary | ICD-10-CM | POA: Diagnosis not present

## 2021-03-30 DIAGNOSIS — M62830 Muscle spasm of back: Secondary | ICD-10-CM | POA: Diagnosis not present

## 2021-04-19 ENCOUNTER — Ambulatory Visit: Payer: BC Managed Care – PPO

## 2021-04-20 ENCOUNTER — Ambulatory Visit: Payer: BC Managed Care – PPO

## 2021-04-23 ENCOUNTER — Other Ambulatory Visit: Payer: Self-pay

## 2021-04-23 ENCOUNTER — Ambulatory Visit: Payer: BC Managed Care – PPO | Admitting: Cardiology

## 2021-04-23 ENCOUNTER — Ambulatory Visit (INDEPENDENT_AMBULATORY_CARE_PROVIDER_SITE_OTHER): Payer: BC Managed Care – PPO

## 2021-04-23 ENCOUNTER — Encounter: Payer: Self-pay | Admitting: Cardiology

## 2021-04-23 VITALS — BP 110/76 | HR 79 | Ht 72.0 in | Wt 186.0 lb

## 2021-04-23 DIAGNOSIS — R000 Tachycardia, unspecified: Secondary | ICD-10-CM

## 2021-04-23 DIAGNOSIS — I10 Essential (primary) hypertension: Secondary | ICD-10-CM | POA: Diagnosis not present

## 2021-04-23 DIAGNOSIS — E7800 Pure hypercholesterolemia, unspecified: Secondary | ICD-10-CM | POA: Diagnosis not present

## 2021-04-23 NOTE — Patient Instructions (Signed)
Medication Instructions:  Your physician recommends that you continue on your current medications as directed. Please refer to the Current Medication list given to you today.  *If you need a refill on your cardiac medications before your next appointment, please call your pharmacy*   Lab Work: None ordered If you have labs (blood work) drawn today and your tests are completely normal, you will receive your results only by: MyChart Message (if you have MyChart) OR A paper copy in the mail If you have any lab test that is abnormal or we need to change your treatment, we will call you to review the results.   Testing/Procedures:  Your physician has recommended that you wear a Zio XT monitor for 2 weeks   This monitor is a medical device that records the heart's electrical activity. Doctors most often use these monitors to diagnose arrhythmias. Arrhythmias are problems with the speed or rhythm of the heartbeat. The monitor is a small device applied to your chest. You can wear one while you do your normal daily activities. While wearing this monitor if you have any symptoms to push the button and record what you felt. Once you have worn this monitor for the period of time provider prescribed (Usually 14 days), you will return the monitor device in the postage paid box. Once it is returned they will download the data collected and provide us with a report which the provider will then review and we will call you with those results. Important tips:  Avoid showering during the first 24 hours of wearing the monitor. Avoid excessive sweating to help maximize wear time. Do not submerge the device, no hot tubs, and no swimming pools. Keep any lotions or oils away from the patch. After 24 hours you may shower with the patch on. Take brief showers with your back facing the shower head.  Do not remove patch once it has been placed because that will interrupt data and decrease adhesive wear time. Push the  button when you have any symptoms and write down what you were feeling. Once you have completed wearing your monitor, remove and place into box which has postage paid and place in your outgoing mailbox.  If for some reason you have misplaced your box then call our office and we can provide another box and/or mail it off for you.      Follow-Up: At [facility], you and your health needs are our priority.  As part of our continuing mission to provide you with exceptional heart care, we have created designated Provider Care Teams.  These Care Teams include your primary Cardiologist (physician) and Advanced Practice Providers (APPs -  Physician Assistants and Nurse Practitioners) who all work together to provide you with the care you need, when you need it.  We recommend signing up for the patient portal called "MyChart".  Sign up information is provided on this After Visit Summary.  MyChart is used to connect with patients for Virtual Visits (Telemedicine).  Patients are able to view lab/test results, encounter notes, upcoming appointments, etc.  Non-urgent messages can be sent to your provider as well.   To learn more about what you can do with MyChart, go to [email or web address].    Your next appointment:   6 week(s)  The format for your next appointment:   In Person  Provider:   Brian Agbor-Etang, MD   Other Instructions   

## 2021-04-23 NOTE — Progress Notes (Signed)
Cardiology Office Note:    Date:  04/23/2021   ID:  Antonio Lara, DOB 1976/02/21, MRN [id number]  PCP:  Hannah Beat, MD2   [facility] Providers Cardiologist:  None     Referring MD: Hannah Beat, MD   Chief Complaint  Patient presents with   New Patient (Initial Visit)    Referred by PCP for Tachycardia and RBBB. Patient c.o SOB.    Antonio Lara is a 45 y.o. male who is being seen today for the evaluation of elevated heart rates at the request of Copland, Spencer, MD.   History of Present Illness:    Antonio Lara is a 45 y.o. male with a hx of hypertension, hyperlipidemia, OSA who presents due to elevated heart rates.  Patient has noticed elevated heart rates over the past year or so.  States heart rates have been up to 120s beats per minute when he is nonexertional.  He is sometimes watching TV or working on his computer when he suddenly noticed palpitations, which sometimes last couple of minutes to almost the whole day.  He saw his primary care provider, started him on Toprol-XL a month ago with improvement in his heart rates.  Heart rates have not been in the 70 to 100 bpm range.  He occasionally has elevated heart rates with exertion or standing too quickly.  Denies any history of heart disease.  Past Medical History:  Diagnosis Date   Diabetes mellitus type 2, diet-controlled (HCC) 03/04/2021   GERD (gastroesophageal reflux disease)    Hyperlipidemia LDL goal <70 03/05/2021   Mild obstructive sleep apnea, controlled with dental appliance    Moderate persistent asthma    Sleep apnea     History reviewed. No pertinent surgical history.  Current Medications: Current Meds  Medication Sig   albuterol (VENTOLIN HFA) 108 (90 Base) MCG/ACT inhaler Inhale 1-2 puffs into the lungs every 4 (four) hours as needed for wheezing or shortness of breath.   budesonide-formoterol (SYMBICORT) 160-4.5 MCG/ACT inhaler INHALE ONE PUFF INTO THE LUNGS TWICE A DAY.  RINSE, GARGE, SPIT AFTER USE.   esomeprazole (NEXIUM) 20 MG capsule TAKE 1 CAPSULE BY MOUTH ONCE DAILY   lisinopril-hydrochlorothiazide (ZESTORETIC) 20-12.5 MG tablet Take 1 tablet by mouth daily.   metoprolol succinate (TOPROL-XL) 50 MG 24 hr tablet Take 1 tablet (50 mg total) by mouth daily. Take with or immediately following a meal.   montelukast (SINGULAIR) 10 MG tablet Take 1 tablet (10 mg total) by mouth at bedtime.   Multiple Vitamin (MULTIVITAMIN) tablet Take 1 tablet by mouth daily.   rosuvastatin (CRESTOR) 10 MG tablet Take 1 tablet (10 mg total) by mouth daily.     Allergies:   Oxycodone-acetaminophen   Social History   Socioeconomic History   Marital status: Married    Spouse name: Not on file   Number of children: 2   Years of education: Not on file   Highest education level: Not on file  Occupational History   Occupation: Investment banker, corporate: [employer]  Tobacco Use   Smoking status: Never   Smokeless tobacco: Never  Vaping Use   Vaping Use: Never used  Substance and Sexual Activity   Alcohol use: Yes    Comment: rarely   Drug use: No   Sexual activity: Not on file  Other Topics Concern   Not on file  Social History Narrative   Drinks one caffeinated beverage a day.   Social Determinants of Health  Financial Resource Strain: Not on file  Food Insecurity: Not on file  Transportation Needs: Not on file  Physical Activity: Not on file  Stress: Not on file  Social Connections: Not on file     Family History: The patient's family history includes Breast cancer in his mother; Cancer in his maternal grandfather; Stroke in his father.  ROS:   Please see the history of present illness.     All other systems reviewed and are negative.  EKGs/Labs/Other Studies Reviewed:    The following studies were reviewed today:   EKG:  EKG is  ordered today.  The ekg ordered today demonstrates sinus rhythm, right bundle branch block.  Recent Labs: 02/23/2021:  ALT 85; BUN 13; Creatinine, Ser 1.04; Hemoglobin 13.8; Platelets 252.0; Potassium 3.3; Sodium 139  Recent Lipid Panel    Component Value Date/Time   CHOL 213 (H) 02/23/2021 0817   TRIG 249.0 (H) 02/23/2021 0817   HDL 45.40 02/23/2021 0817   CHOLHDL 5 02/23/2021 0817   VLDL 49.8 (H) 02/23/2021 0817   LDLCALC 156 (H) 07/09/2019 1001   LDLDIRECT 128.0 02/23/2021 0817     Risk Assessment/Calculations:          Physical Exam:    VS:  BP 110/76 (BP Location: Left Arm, Patient Position: Sitting, Cuff Size: Normal)   Pulse 79   Ht 6' (1.829 m)   Wt 186 lb (84.4 kg)   SpO2 98%   BMI 25.23 kg/m     Wt Readings from Last 3 Encounters:  04/23/21 186 lb (84.4 kg)  03/04/21 190 lb (86.2 kg)  10/19/20 199 lb 4 oz (90.4 kg)     GEN:  Well nourished, well developed in no acute distress HEENT: Normal NECK: No JVD; No carotid bruits LYMPHATICS: No lymphadenopathy CARDIAC: RRR, no murmurs, rubs, gallops RESPIRATORY:  Clear to auscultation without rales, wheezing or rhonchi  ABDOMEN: Soft, non-tender, non-distended MUSCULOSKELETAL:  No edema; No deformity  SKIN: Warm and dry NEUROLOGIC:  Alert and oriented x 3 PSYCHIATRIC:  Normal affect   ASSESSMENT:    1. Tachycardia   2. Primary hypertension   3. Pure hypercholesterolemia    PLAN:    In order of problems listed above:  Tachycardia, patient may have inappropriate sinus tach.  Overall symptoms have improved with Toprol XL, although he occasionally has palpitations.  Place a 2-week cardiac monitor to evaluate any significant arrhythmias, and tachycardia burden.  May consider up titration of beta-blocker if significant tachycardia is noticed. Hypertension, BP controlled.  Continue lisinopril, HCTZ, Toprol-XL. Hyperlipidemia, continue statin.  Follow-up after cardiac monitor.     Medication Adjustments/Labs and Tests Ordered: Current medicines are reviewed at length with the patient today.  Concerns regarding medicines are  outlined above.  Orders Placed This Encounter  Procedures   LONG TERM MONITOR (3-14 DAYS)   EKG 12-Lead    No orders of the defined types were placed in this encounter.   Patient Instructions  Medication Instructions:  Your physician recommends that you continue on your current medications as directed. Please refer to the Current Medication list given to you today.  *If you need a refill on your cardiac medications before your next appointment, please call your pharmacy*   Lab Work: None ordered If you have labs (blood work) drawn today and your tests are completely normal, you will receive your results only by: MyChart Message (if you have MyChart) OR A paper copy in the mail If you have any lab test that is  abnormal or we need to change your treatment, we will call you to review the results.   Testing/Procedures:  Your physician has recommended that you wear a Zio XT monitor for 2 weeks.   This monitor is a medical device that records the heart's electrical activity. Doctors most often use these monitors to diagnose arrhythmias. Arrhythmias are problems with the speed or rhythm of the heartbeat. The monitor is a small device applied to your chest. You can wear one while you do your normal daily activities. While wearing this monitor if you have any symptoms to push the button and record what you felt. Once you have worn this monitor for the period of time provider prescribed (Usually 14 days), you will return the monitor device in the postage paid box. Once it is returned they will download the data collected and provide [place] with a report which the provider will then review and we will call you with those results. Important tips:  Avoid showering during the first 24 hours of wearing the monitor. Avoid excessive sweating to help maximize wear time. Do not submerge the device, no hot tubs, and no swimming pools. Keep any lotions or oils away from the patch. After 24 hours you may  shower with the patch on. Take brief showers with your back facing the shower head.  Do not remove patch once it has been placed because that will interrupt data and decrease adhesive wear time. Push the button when you have any symptoms and write down what you were feeling. Once you have completed wearing your monitor, remove and place into box which has postage paid and place in your outgoing mailbox.  If for some reason you have misplaced your box then call our office and we can provide another box and/or mail it off for you.      Follow-Up: At [facility], you and your health needs are our priority.  As part of our continuing mission to provide you with exceptional heart care, we have created designated Provider Care Teams.  These Care Teams include your primary Cardiologist (physician) and Advanced Practice Providers (APPs -  Physician Assistants and Nurse Practitioners) who all work together to provide you with the care you need, when you need it.  We recommend signing up for the patient portal called "MyChart".  Sign up information is provided on this After Visit Summary.  MyChart is used to connect with patients for Virtual Visits (Telemedicine).  Patients are able to view lab/test results, encounter notes, upcoming appointments, etc.  Non-urgent messages can be sent to your provider as well.   To learn more about what you can do with MyChart, go to [email or web address].    Your next appointment:   6 week(s)  The format for your next appointment:   In Person  Provider:   Debbe Odea, MD   Other Instructions    Signed, Debbe Odea, MD  04/23/2021 4:40 PM     [facility]

## 2021-04-28 ENCOUNTER — Ambulatory Visit: Payer: BC Managed Care – PPO | Admitting: Dietician

## 2021-05-01 ENCOUNTER — Other Ambulatory Visit: Payer: Self-pay | Admitting: Internal Medicine

## 2021-05-06 DIAGNOSIS — M9906 Segmental and somatic dysfunction of lower extremity: Secondary | ICD-10-CM | POA: Diagnosis not present

## 2021-05-06 DIAGNOSIS — M4602 Spinal enthesopathy, cervical region: Secondary | ICD-10-CM | POA: Diagnosis not present

## 2021-05-06 DIAGNOSIS — M9901 Segmental and somatic dysfunction of cervical region: Secondary | ICD-10-CM | POA: Diagnosis not present

## 2021-05-06 DIAGNOSIS — M62830 Muscle spasm of back: Secondary | ICD-10-CM | POA: Diagnosis not present

## 2021-05-07 ENCOUNTER — Other Ambulatory Visit: Payer: Self-pay | Admitting: Internal Medicine

## 2021-05-10 ENCOUNTER — Other Ambulatory Visit: Payer: Self-pay | Admitting: Internal Medicine

## 2021-05-12 ENCOUNTER — Ambulatory Visit: Payer: BC Managed Care – PPO

## 2021-05-14 DIAGNOSIS — R000 Tachycardia, unspecified: Secondary | ICD-10-CM | POA: Diagnosis not present

## 2021-05-17 ENCOUNTER — Other Ambulatory Visit: Payer: Self-pay | Admitting: *Deleted

## 2021-05-17 ENCOUNTER — Other Ambulatory Visit: Payer: Self-pay | Admitting: Internal Medicine

## 2021-05-17 MED ORDER — ESOMEPRAZOLE MAGNESIUM 20 MG PO CPDR: 20.0000 mg | DELAYED_RELEASE_CAPSULE | Freq: Every day | ORAL | 3 refills | Status: DC

## 2021-05-18 ENCOUNTER — Ambulatory Visit: Payer: BC Managed Care – PPO

## 2021-05-18 ENCOUNTER — Other Ambulatory Visit: Payer: Self-pay

## 2021-05-18 NOTE — Progress Notes (Unsigned)
Nutrition: 05/18/21  [id number] with his blood glucose levels and cholesterol levels and health changes.   Assessment: HT: 6'  WT: 186 lb.  BMI 25.22  Labs; Glucose 152, HgA1C 6.7%, Total chol 213, HDL 45, LDL 128, TG 249, non-HDL 164.  He is employed by [employer].  Works 1 month of nights followed by 1 month of day shift.  Notes that he gets very poor sleep when on the night shift.  Gives a positive family history of paternal (father and his dad's 4 siblings all have DM as did the grandfather).  Activity: Walks as much as possible when at work.  Averages 10,000 steps per day.  Has foot issues and has special orthotics. He has a goal to walk as much and as often as I can at work.  Nutritional intake:  Huxley Shurley eat 1-3 meals per day depends on the shift.  Night Shift;  Beginning of shift will have a medium cherry Coke and later a low fat cappuccino.  2 AM: Aliviya Schoeller have a [employer] bar.  Sometime will eat in the dining hall.  Typically Damesha Lawler have  Rice, beef tips, spinach salad with cheese and onion.  Cranberry juice to drink. =/- cookie for dessert.   Recommendations; Keep up the walking and steps. Use a carb counting app to look at the    And carb that are in your soda, coffee,      Juice. Cut the juice with 1/2 water to start or use a diet juice. Try checking your blood glucose 2 hours after a meal  Aim for 120 mg or less at 2 hrs after the first bite.  Use the plate method for your meals at the night shift. Use leaner cuts of meat, use poultry and fish more often than red meat.  Goal: Keep my blood sugar and cholesterol down.  Plan: Try to eat less pure sugar and less fat.  Teaching materials; Prediabetes Handout Plate method handout Food Label handout Yellow food Card  F/U:  Nov. Or Dec.  Maggie Britley Gashi RN, RD, LDN

## 2021-05-20 ENCOUNTER — Other Ambulatory Visit: Payer: Self-pay | Admitting: Internal Medicine

## 2021-05-25 ENCOUNTER — Other Ambulatory Visit: Payer: Self-pay | Admitting: Internal Medicine

## 2021-05-25 ENCOUNTER — Telehealth: Payer: Self-pay | Admitting: Internal Medicine

## 2021-05-25 MED ORDER — BUDESONIDE-FORMOTEROL FUMARATE 160-4.5 MCG/ACT IN AERO: INHALATION_SPRAY | RESPIRATORY_TRACT | 0 refills | Status: DC

## 2021-05-25 MED ORDER — MONTELUKAST SODIUM 10 MG PO TABS: 10.0000 mg | ORAL_TABLET | Freq: Every day | ORAL | 0 refills | Status: DC

## 2021-05-25 NOTE — Telephone Encounter (Signed)
One month supple of Singulair and Symbicort has been sent to preferred pharmacy.  Patient is aware to keep scheduled visit for further refills.  Nothing further needed at this time.

## 2021-06-04 ENCOUNTER — Ambulatory Visit: Payer: BC Managed Care – PPO | Admitting: Cardiology

## 2021-06-04 ENCOUNTER — Other Ambulatory Visit: Payer: Self-pay

## 2021-06-04 ENCOUNTER — Encounter: Payer: Self-pay | Admitting: Cardiology

## 2021-06-04 VITALS — BP 108/80 | HR 71 | Ht 72.0 in | Wt 186.0 lb

## 2021-06-04 DIAGNOSIS — E7800 Pure hypercholesterolemia, unspecified: Secondary | ICD-10-CM

## 2021-06-04 DIAGNOSIS — R000 Tachycardia, unspecified: Secondary | ICD-10-CM

## 2021-06-04 DIAGNOSIS — I10 Essential (primary) hypertension: Secondary | ICD-10-CM

## 2021-06-04 NOTE — Progress Notes (Signed)
Cardiology Office Note:    Date:  06/04/2021   ID:  Antonio Lara, DOB 01/03/1976, MRN [id number]  PCP:  Hannah Beat, MD2   [facility] Providers Cardiologist:  None     Referring MD: Hannah Beat, MD   Chief Complaint  Patient presents with   Other    6 week follow up post ZIO - [place] reviewed verbally with patient.     History of Present Illness:    Antonio Lara is a 45 y.o. male with a hx of hypertension, hyperlipidemia, OSA who presents for follow-up.  He was last seen due to elevated heart rates.  Heart rates have improved since starting Toprol-XL.  2-week cardiac monitor was placed to evaluate any significant arrhythmias.  He states feeling well, may occasionally have increased heart rates but overall heart rates are normal.  He feels well, denies chest pain or shortness of breath, has no other concerns at this time.  Past Medical History:  Diagnosis Date   Diabetes mellitus type 2, diet-controlled (HCC) 03/04/2021   GERD (gastroesophageal reflux disease)    Hyperlipidemia LDL goal <70 03/05/2021   Mild obstructive sleep apnea, controlled with dental appliance    Moderate persistent asthma    Sleep apnea     History reviewed. No pertinent surgical history.  Current Medications: Current Meds  Medication Sig   albuterol (VENTOLIN HFA) 108 (90 Base) MCG/ACT inhaler Inhale 1-2 puffs into the lungs every 4 (four) hours as needed for wheezing or shortness of breath.   budesonide-formoterol (SYMBICORT) 160-4.5 MCG/ACT inhaler INHALE ONE PUFF INTO THE LUNGS TWICE A DAY. RINSE, GARGE, SPIT AFTER USE.   esomeprazole (NEXIUM) 20 MG capsule Take 1 capsule (20 mg total) by mouth daily.   lisinopril-hydrochlorothiazide (ZESTORETIC) 20-12.5 MG tablet Take 1 tablet by mouth daily.   metoprolol succinate (TOPROL-XL) 50 MG 24 hr tablet Take 1 tablet (50 mg total) by mouth daily. Take with or immediately following a meal.   montelukast (SINGULAIR) 10 MG tablet  Take 1 tablet (10 mg total) by mouth at bedtime.   Multiple Vitamin (MULTIVITAMIN) tablet Take 1 tablet by mouth daily.   rosuvastatin (CRESTOR) 10 MG tablet Take 1 tablet (10 mg total) by mouth daily.     Allergies:   Oxycodone-acetaminophen   Social History   Socioeconomic History   Marital status: Married    Spouse name: Not on file   Number of children: 2   Years of education: Not on file   Highest education level: Not on file  Occupational History   Occupation: Investment banker, corporate: [employer]  Tobacco Use   Smoking status: Never   Smokeless tobacco: Never  Vaping Use   Vaping Use: Never used  Substance and Sexual Activity   Alcohol use: Yes    Comment: rarely   Drug use: No   Sexual activity: Not on file  Other Topics Concern   Not on file  Social History Narrative   Drinks one caffeinated beverage a day.   Social Determinants of Health   Financial Resource Strain: Not on file  Food Insecurity: Not on file  Transportation Needs: Not on file  Physical Activity: Not on file  Stress: Not on file  Social Connections: Not on file     Family History: The patient's family history includes Breast cancer in his mother; Cancer in his maternal grandfather; Stroke in his father.  ROS:   Please see the history of present illness.  All other systems reviewed and are negative.  EKGs/Labs/Other Studies Reviewed:    The following studies were reviewed today:   EKG:  EKG not ordered today.   Recent Labs: 02/23/2021: ALT 85; BUN 13; Creatinine, Ser 1.04; Hemoglobin 13.8; Platelets 252.0; Potassium 3.3; Sodium 139  Recent Lipid Panel    Component Value Date/Time   CHOL 213 (H) 02/23/2021 0817   TRIG 249.0 (H) 02/23/2021 0817   HDL 45.40 02/23/2021 0817   CHOLHDL 5 02/23/2021 0817   VLDL 49.8 (H) 02/23/2021 0817   LDLCALC 156 (H) 07/09/2019 1001   LDLDIRECT 128.0 02/23/2021 0817     Risk Assessment/Calculations:          Physical Exam:    VS:  BP  108/80 (BP Location: Left Arm, Patient Position: Sitting, Cuff Size: Normal)   Pulse 71   Ht 6' (1.829 m)   Wt 186 lb (84.4 kg)   SpO2 98%   BMI 25.23 kg/m     Wt Readings from Last 3 Encounters:  06/04/21 186 lb (84.4 kg)  04/23/21 186 lb (84.4 kg)  03/04/21 190 lb (86.2 kg)     GEN:  Well nourished, well developed in no acute distress HEENT: Normal NECK: No JVD; No carotid bruits LYMPHATICS: No lymphadenopathy CARDIAC: RRR, no murmurs, rubs, gallops RESPIRATORY:  Clear to auscultation without rales, wheezing or rhonchi  ABDOMEN: Soft, non-tender, non-distended MUSCULOSKELETAL:  No edema; No deformity  SKIN: Warm and dry NEUROLOGIC:  Alert and oriented x 3 PSYCHIATRIC:  Normal affect   ASSESSMENT:    1. Tachycardia   2. Primary hypertension   3. Pure hypercholesterolemia     PLAN:    In order of problems listed above:  Tachycardia, patient may have inappropriate sinus tach.  Overall symptoms have improved with Toprol XL, although he occasionally has palpitations.  Cardiac monitor revealed no significant arrhythmias. Hypertension, BP controlled.  Continue lisinopril, HCTZ, Toprol-XL. Hyperlipidemia, continue Crestor  Follow-up as needed   Medication Adjustments/Labs and Tests Ordered: Current medicines are reviewed at length with the patient today.  Concerns regarding medicines are outlined above.  No orders of the defined types were placed in this encounter.   No orders of the defined types were placed in this encounter.   Patient Instructions  Medication Instructions:  Your physician recommends that you continue on your current medications as directed. Please refer to the Current Medication list given to you today.  *If you need a refill on your cardiac medications before your next appointment, please call your pharmacy*   Lab Work: None ordered If you have labs (blood work) drawn today and your tests are completely normal, you will receive your results  only by: MyChart Message (if you have MyChart) OR A paper copy in the mail If you have any lab test that is abnormal or we need to change your treatment, we will call you to review the results.   Testing/Procedures: None ordered   Follow-Up: At [facility], you and your health needs are our priority.  As part of our continuing mission to provide you with exceptional heart care, we have created designated Provider Care Teams.  These Care Teams include your primary Cardiologist (physician) and Advanced Practice Providers (APPs -  Physician Assistants and Nurse Practitioners) who all work together to provide you with the care you need, when you need it.  We recommend signing up for the patient portal called "MyChart".  Sign up information is provided on this After Visit Summary.  MyChart  is used to connect with patients for Virtual Visits (Telemedicine).  Patients are able to view lab/test results, encounter notes, upcoming appointments, etc.  Non-urgent messages can be sent to your provider as well.   To learn more about what you can do with MyChart, go to [email or web address].    Your next appointment:   Follow up as needed   The format for your next appointment:   In Person  Provider:   You may see Dr. Azucena Cecil or one of the following Advanced Practice Providers on your designated Care Team:   Nicolasa Ducking, NP Eula Listen, PA-C Marisue Ivan, PA-C Cadence Carlisle, [place]   Other Instructions    Signed, Debbe Odea, MD  06/04/2021 4:56 PM    [facility]

## 2021-06-04 NOTE — Patient Instructions (Signed)
Medication Instructions:  Your physician recommends that you continue on your current medications as directed. Please refer to the Current Medication list given to you today.  *If you need a refill on your cardiac medications before your next appointment, please call your pharmacy*   Lab Work: None ordered If you have labs (blood work) drawn today and your tests are completely normal, you will receive your results only by: MyChart Message (if you have MyChart) OR A paper copy in the mail If you have any lab test that is abnormal or we need to change your treatment, we will call you to review the results.   Testing/Procedures: None ordered   Follow-Up: At [facility], you and your health needs are our priority.  As part of our continuing mission to provide you with exceptional heart care, we have created designated Provider Care Teams.  These Care Teams include your primary Cardiologist (physician) and Advanced Practice Providers (APPs -  Physician Assistants and Nurse Practitioners) who all work together to provide you with the care you need, when you need it.  We recommend signing up for the patient portal called "MyChart".  Sign up information is provided on this After Visit Summary.  MyChart is used to connect with patients for Virtual Visits (Telemedicine).  Patients are able to view lab/test results, encounter notes, upcoming appointments, etc.  Non-urgent messages can be sent to your provider as well.   To learn more about what you can do with MyChart, go to [email or web address].    Your next appointment:   Follow up as needed   The format for your next appointment:   In Person  Provider:   You may see Dr. Agbor-Etang or one of the following Advanced Practice Providers on your designated Care Team:   Christopher Berge, NP Ryan Dunn, PA-C Jacquelyn Visser, PA-C Cadence Furth, PA-C   Other Instructions   

## 2021-06-07 ENCOUNTER — Ambulatory Visit: Payer: BC Managed Care – PPO | Admitting: Family Medicine

## 2021-06-10 ENCOUNTER — Other Ambulatory Visit: Payer: Self-pay

## 2021-06-10 ENCOUNTER — Ambulatory Visit: Payer: BC Managed Care – PPO | Admitting: Family Medicine

## 2021-06-10 ENCOUNTER — Encounter: Payer: Self-pay | Admitting: Family Medicine

## 2021-06-10 VITALS — BP 90/70 | HR 70 | Temp 97.7°F | Ht 72.5 in | Wt 187.4 lb

## 2021-06-10 DIAGNOSIS — E785 Hyperlipidemia, unspecified: Secondary | ICD-10-CM

## 2021-06-10 DIAGNOSIS — E119 Type 2 diabetes mellitus without complications: Secondary | ICD-10-CM

## 2021-06-10 LAB — LIPID PANEL
Cholesterol: 144 mg/dL (ref 0–200)
HDL: 50.7 mg/dL (ref >39.00)
LDL Cholesterol: 70 mg/dL (ref 0–99)
NonHDL: 93.63
Total CHOL/HDL Ratio: 3
Triglycerides: 118 mg/dL (ref 0.0–149.0)
VLDL: 23.6 mg/dL (ref 0.0–40.0)

## 2021-06-10 LAB — HEPATIC FUNCTION PANEL
ALT: 23 U/L (ref 0–53)
AST: 21 U/L (ref 0–37)
Albumin: 4.9 g/dL (ref 3.5–5.2)
Alkaline Phosphatase: 80 U/L (ref 39–117)
Bilirubin, Direct: 0.1 mg/dL (ref 0.0–0.3)
Total Bilirubin: 0.7 mg/dL (ref 0.2–1.2)
Total Protein: 7.4 g/dL (ref 6.0–8.3)

## 2021-06-10 LAB — POCT GLYCOSYLATED HEMOGLOBIN (HGB A1C): Hemoglobin A1C: 5.6 % (ref 4.0–5.6)

## 2021-06-10 NOTE — Progress Notes (Signed)
Bryant Saye T. Samra Pesch, MD, CAQ Sports Medicine [facility] at [facility] [address] [place], [address]  Phone: [phone]  FAX: [phone]  Antonio Lara - 45 y.o. male  MRN [id number]  Date of Birth: 09/10/1975  Date: 06/10/2021  PCP: Hannah Beat, MD  Referral: Hannah Beat, MD  Chief Complaint  Patient presents with   Diabetes    This visit occurred during the SARS-CoV-2 public health emergency.  Safety protocols were in place, including screening questions prior to the visit, additional usage of staff PPE, and extensive cleaning of exam room while observing appropriate contact time as indicated for disinfecting solutions.   Subjective:   Antonio Lara is a 45 y.o. very pleasant male patient with Body mass index is 25.06 kg/m. who presents with the following:  Diabetes Mellitus: Tolerating Medications: none right now Compliance with diet: fair, Body mass index is 25.06 kg/m. Exercise: minimal / intermittent Avg blood sugars at home: not checking Foot problems: none Hypoglycemia: none No nausea, vomitting, blurred vision, polyuria.  Lab Results  Component Value Date   HGBA1C 5.6 06/10/2021   HGBA1C 6.7 (H) 02/23/2021   HGBA1C 5.4 07/09/2019   Lab Results  Component Value Date   LDLCALC 156 (H) 07/09/2019   CREATININE 1.04 02/23/2021    Wt Readings from Last 3 Encounters:  06/10/21 187 lb 6 oz (85 kg)  06/04/21 186 lb (84.4 kg)  04/23/21 186 lb (84.4 kg)    Ankle have bothered him some.  More MT heads  Lipids: Doing well, stable. Tolerating meds fine with no SE. Panel reviewed with patient.  Lipids: Lab Results  Component Value Date   CHOL 144 06/10/2021   Lab Results  Component Value Date   HDL 50.70 06/10/2021   Lab Results  Component Value Date   LDLCALC 70 06/10/2021   Lab Results  Component Value Date   TRIG 118.0 06/10/2021   Lab Results  Component Value Date   CHOLHDL 3 06/10/2021     Lab Results  Component Value Date   ALT 23 06/10/2021   AST 21 06/10/2021   ALKPHOS 80 06/10/2021   BILITOT 0.7 06/10/2021     Cardiac work-up was positive for sinus tach occasional only, placed on metoprolol with resolved symptoms.  Review of Systems is noted in the HPI, as appropriate  Objective:   BP 90/70   Pulse 70   Temp 97.7 F (36.5 C) (Temporal)   Ht 6' 0.5" (1.842 m)   Wt 187 lb 6 oz (85 kg)   SpO2 98%   BMI 25.06 kg/m   GEN: No acute distress; alert,appropriate. PULM: Breathing comfortably in no respiratory distress PSYCH: Normally interactive.  CV: RRR, no m/g/r   Laboratory and Imaging Data: Results for orders placed or performed in visit on 06/10/21  POCT glycosylated hemoglobin (Hb A1C)  Result Value Ref Range   Hemoglobin A1C 5.6 4.0 - 5.6 %   HbA1c POC (<> result, manual entry)     HbA1c, POC (prediabetic range)     HbA1c, POC (controlled diabetic range)      Lab Results  Component Value Date   CHOL 144 06/10/2021   CHOL 213 (H) 02/23/2021   CHOL 221 (H) 07/09/2019   Lab Results  Component Value Date   HDL 50.70 06/10/2021   HDL 45.40 02/23/2021   HDL 48.00 07/09/2019   Lab Results  Component Value Date   LDLCALC 70 06/10/2021   LDLCALC 156 (H) 07/09/2019  Lab Results  Component Value Date   TRIG 118.0 06/10/2021   TRIG 249.0 (H) 02/23/2021   TRIG 86.0 07/09/2019   Lab Results  Component Value Date   CHOLHDL 3 06/10/2021   CHOLHDL 5 02/23/2021   CHOLHDL 5 07/09/2019   Lab Results  Component Value Date   LDLDIRECT 128.0 02/23/2021     Assessment and Plan:     ICD-10-CM   1. Diabetes mellitus type 2, diet-controlled (HCC)  E11.9 POCT glycosylated hemoglobin (Hb A1C)    2. Hyperlipidemia LDL goal <70  E78.5 Lipid panel    Hepatic function panel     Improved A1c diet controlled only.  Continue with good diet, exercise as able.  Recheck lipids today - doing much better.   No orders of the defined types were placed  in this encounter.  There are no discontinued medications. Orders Placed This Encounter  Procedures   Lipid panel   Hepatic function panel   POCT glycosylated hemoglobin (Hb A1C)    Follow-up: Return for July for routine physical .  Dragon [employer] One speech-to-text software was used for transcription in this dictation.  Possible transcriptional errors can occur using Animal nutritionist.   Signed,  Elpidio Galea. Karima Carrell, MD   Outpatient Encounter Medications as of 06/10/2021  Medication Sig   albuterol (VENTOLIN HFA) 108 (90 Base) MCG/ACT inhaler Inhale 1-2 puffs into the lungs every 4 (four) hours as needed for wheezing or shortness of breath.   budesonide-formoterol (SYMBICORT) 160-4.5 MCG/ACT inhaler INHALE ONE PUFF INTO THE LUNGS TWICE A DAY. RINSE, GARGE, SPIT AFTER USE.   esomeprazole (NEXIUM) 20 MG capsule Take 1 capsule (20 mg total) by mouth daily.   lisinopril-hydrochlorothiazide (ZESTORETIC) 20-12.5 MG tablet Take 1 tablet by mouth daily.   metoprolol succinate (TOPROL-XL) 50 MG 24 hr tablet Take 1 tablet (50 mg total) by mouth daily. Take with or immediately following a meal.   montelukast (SINGULAIR) 10 MG tablet Take 1 tablet (10 mg total) by mouth at bedtime.   Multiple Vitamin (MULTIVITAMIN) tablet Take 1 tablet by mouth daily.   rosuvastatin (CRESTOR) 10 MG tablet Take 1 tablet (10 mg total) by mouth daily.   No facility-administered encounter medications on file as of 06/10/2021.

## 2021-06-14 DIAGNOSIS — M9906 Segmental and somatic dysfunction of lower extremity: Secondary | ICD-10-CM | POA: Diagnosis not present

## 2021-06-14 DIAGNOSIS — M62830 Muscle spasm of back: Secondary | ICD-10-CM | POA: Diagnosis not present

## 2021-06-14 DIAGNOSIS — M4602 Spinal enthesopathy, cervical region: Secondary | ICD-10-CM | POA: Diagnosis not present

## 2021-06-14 DIAGNOSIS — M9901 Segmental and somatic dysfunction of cervical region: Secondary | ICD-10-CM | POA: Diagnosis not present

## 2021-06-15 ENCOUNTER — Ambulatory Visit: Payer: BC Managed Care – PPO | Admitting: Internal Medicine

## 2021-06-15 ENCOUNTER — Encounter: Payer: Self-pay | Admitting: Internal Medicine

## 2021-06-15 ENCOUNTER — Other Ambulatory Visit: Payer: Self-pay

## 2021-06-15 VITALS — BP 106/68 | HR 75 | Temp 97.5°F | Ht 72.0 in | Wt 187.6 lb

## 2021-06-15 DIAGNOSIS — J4520 Mild intermittent asthma, uncomplicated: Secondary | ICD-10-CM

## 2021-06-15 DIAGNOSIS — G4733 Obstructive sleep apnea (adult) (pediatric): Secondary | ICD-10-CM

## 2021-06-15 NOTE — Patient Instructions (Addendum)
  Continue Symbicort as prescribed Albuterol as needed Avoid allergens  Avoid sick contacts  Continue dental device for sleep apnea

## 2021-06-15 NOTE — Progress Notes (Signed)
PROBLEMS/synopsis Moderate persistent asthma Mild OSA  - intolerant to CPAP   DATA: Sleep study 02/02/16: AHI 11.8/hr with frequent arousals and sleep fragmentation. Spirometry 06/14/13: FEV1 3.85 liters (85% pred), FEV1/FVC 79% Spirometry 02/26/16:  FEV1 3.81 liters (84% pred), FEV1/FVC 81% CPAP compliance 11/05-12/04/17: Set pressure 9 cm H2O. Usage 20/30 days. 17 days > 4 hrs.  CPAP compliance: Set pressure 9 cm H2O. Usage 20/30 days. 17 days > 4 hrs      INTERVAL HISTORY: Last visit 07/13/18. No major pulmonary events  CC Follow-up asthma Follow-up OSA     HPI No exacerbation at this time No evidence of heart failure at this time No evidence or signs of infection at this time No respiratory distress No fevers, chills, nausea, vomiting, diarrhea No evidence of lower extremity edema No evidence hemoptysis  Asthma seems to be well-controlled at this time uses albuterol infrequently  Patient has a diagnosis of sleep apnea however he does not wear CPAP machine Patient wears dental device and according to family prevents him from snoring  Patient is to avoid triggers such as cold air weather and exercise GERD seems to be under control with Nexium Albuterol as needed prior to exercise  Patient also changed jobs 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 Patient is exposed to more environmental exposures such as pollen grass and smoke Previously employed at [employer]  POST COVID INFECTION June 2022 Seems to be back to baseline state of health Has some memory issues 2 rounds of pred needed at the time     Review of Systems:  Gen:  Denies  fever, sweats, chills weight loss  HEENT: Denies blurred vision, double vision, ear pain, eye pain, hearing loss, nose bleeds, sore throat Cardiac:  No dizziness, chest pain or heaviness, chest tightness,edema, No JVD Resp:   No cough, -sputum production, -shortness of breath,-wheezing, -hemoptysis,  Other:  All  other systems negative  BP 106/68 (BP Location: Left Arm, Cuff Size: Normal)   Pulse 75   SpO2 99%      Physical Examination:   General Appearance: No distress  EYES PERRLA, EOM intact.   NECK Supple, No JVD Pulmonary: normal breath sounds, No wheezing.  CardiovascularNormal S1,S2.  No m/r/g.   ALL OTHER ROS ARE NEGATIVE    Current Outpatient Medications on File Prior to Visit  Medication Sig Dispense Refill   albuterol (VENTOLIN HFA) 108 (90 Base) MCG/ACT inhaler Inhale 1-2 puffs into the lungs every 4 (four) hours as needed for wheezing or shortness of breath. 18 g 2   budesonide-formoterol (SYMBICORT) 160-4.5 MCG/ACT inhaler INHALE ONE PUFF INTO THE LUNGS TWICE A DAY. RINSE, GARGE, SPIT AFTER USE. 10.2 g 0   esomeprazole (NEXIUM) 20 MG capsule Take 1 capsule (20 mg total) by mouth daily. 90 capsule 3   lisinopril-hydrochlorothiazide (ZESTORETIC) 20-12.5 MG tablet Take 1 tablet by mouth daily. 90 tablet 3   metoprolol succinate (TOPROL-XL) 50 MG 24 hr tablet Take 1 tablet (50 mg total) by mouth daily. Take with or immediately following a meal. 30 tablet 2   montelukast (SINGULAIR) 10 MG tablet Take 1 tablet (10 mg total) by mouth at bedtime. 30 tablet 0   Multiple Vitamin (MULTIVITAMIN) tablet Take 1 tablet by mouth daily.     rosuvastatin (CRESTOR) 10 MG tablet Take 1 tablet (10 mg total) by mouth daily. 90 tablet 3   No current facility-administered medications on file prior to visit.     DATA: BMP Latest Ref  Rng & Units 02/23/2021 02/09/2021 07/09/2019  Glucose 70 - 99 mg/dL 622(Q) 333(L) 456(Y)  BUN 6 - 23 mg/dL 13 14 16   Creatinine 0.40 - 1.50 mg/dL 5.63 8.93  BUN/Creat Ratio 9 - 20 - 13 -  Sodium 135 - 145 mEq/L 139 140 142  Potassium 3.5 - 5.1 mEq/L 3.3(L) 3.7 3.9  Chloride 96 - 112 mEq/L 99 99 103  CO2 19 - 32 mEq/L 30 24 29   Calcium 8.4 - 10.5 mg/dL 9.1 9.8 9.4   CBC Latest Ref Rng & Units 02/23/2021 07/09/2019  WBC 4.0 - 10.5 K/uL 10.6(H) 6.4  Hemoglobin  13.0 - 17.0 g/dL 04/26/2021 07/11/2019  Hematocrit 28.7 - 52.0 % 40.0 43.0  Platelets 150.0 - 400.0 K/uL 252.0 247.0    CXR: NNF  Assessment and plan 45 year old pleasant white male with diagnosis of mild intermittent asthma which is well controlled at this time with avoidance of triggers and albuterol as needed along with underlying sleep apnea controlled by dental appliance   POST COVID  19 infection Seems to be back to baseline    Mild intermittent asthma Seems to be controlled at this time with Symbicort Avoid triggers Rinse mouth after every use Continue Singulair Albuterol as needed   OSA Continues to wear dental device This seems to be helping his snoring    MEDICATION ADJUSTMENTS/LABS AND TESTS ORDERED: Continue dental device for sleep apnea Continue Symbicort as prescribed Avoid allergens  Avoid sick contacts  Follow up 1 year  Total Time spent 22 mins  Anayely Constantine 15.7, M.D.  [facility]  Medical Director [facility] Medical Director [facility] Cardio-Pulmonary Department

## 2021-06-21 ENCOUNTER — Other Ambulatory Visit: Payer: Self-pay | Admitting: Family Medicine

## 2021-06-21 ENCOUNTER — Other Ambulatory Visit: Payer: Self-pay | Admitting: Internal Medicine

## 2021-07-28 ENCOUNTER — Other Ambulatory Visit: Payer: Self-pay | Admitting: Internal Medicine

## 2021-08-07 ENCOUNTER — Encounter: Payer: Self-pay | Admitting: Family Medicine

## 2021-08-09 MED ORDER — TIZANIDINE HCL 4 MG PO TABS: 4.0000 mg | ORAL_TABLET | Freq: Every evening | ORAL | 3 refills | Status: DC | PRN

## 2021-08-11 ENCOUNTER — Other Ambulatory Visit: Payer: Self-pay

## 2021-08-11 ENCOUNTER — Encounter: Payer: Self-pay | Admitting: Family Medicine

## 2021-08-11 ENCOUNTER — Ambulatory Visit: Payer: BC Managed Care – PPO | Admitting: Family Medicine

## 2021-08-11 VITALS — BP 120/78 | HR 99 | Temp 98.2°F | Ht 72.5 in | Wt 195.5 lb

## 2021-08-11 DIAGNOSIS — J329 Chronic sinusitis, unspecified: Secondary | ICD-10-CM

## 2021-08-11 DIAGNOSIS — J3489 Other specified disorders of nose and nasal sinuses: Secondary | ICD-10-CM

## 2021-08-11 DIAGNOSIS — B9789 Other viral agents as the cause of diseases classified elsewhere: Secondary | ICD-10-CM | POA: Diagnosis not present

## 2021-08-11 DIAGNOSIS — M545 Low back pain, unspecified: Secondary | ICD-10-CM

## 2021-08-11 LAB — POC COVID19 BINAXNOW: SARS Coronavirus 2 Ag: NEGATIVE

## 2021-08-11 MED ORDER — CYCLOBENZAPRINE HCL 10 MG PO TABS: 5.0000 mg | ORAL_TABLET | Freq: Every evening | ORAL | 1 refills | Status: DC | PRN

## 2021-08-11 MED ORDER — PREDNISONE 20 MG PO TABS: ORAL_TABLET | ORAL | 0 refills | Status: DC

## 2021-08-11 MED ORDER — AMOXICILLIN-POT CLAVULANATE 875-125 MG PO TABS: 1.0000 | ORAL_TABLET | Freq: Two times a day (BID) | ORAL | 0 refills | Status: DC

## 2021-08-11 NOTE — Patient Instructions (Signed)
Flonase 2 puffs once a day each nostril

## 2021-08-11 NOTE — Progress Notes (Signed)
Javell Blackburn T. Keri Veale, MD, CAQ Sports Medicine [facility] at [facility] [address] [place], [address]  Phone: [phone]   FAX: [phone]  Antonio Lara - 45 y.o. male   MRN [id number]   Date of Birth: 07/26/1976  Date: 08/11/2021   PCP: Hannah Beat, MD   Referral: Hannah Beat, MD  Chief Complaint  Patient presents with   Back Pain   Sinus Drainage    This visit occurred during the SARS-CoV-2 public health emergency.  Safety protocols were in place, including screening questions prior to the visit, additional usage of staff PPE, and extensive cleaning of exam room while observing appropriate contact time as indicated for disinfecting solutions.   Subjective:   Antonio Lara is a 45 y.o. very pleasant male patient who presents with the following: Back Pain  ongoing for approximately:  5 d The patient has had back pain before.  Off and on for 10 years. The back pain is localized into the lumbar spine area. They also describe no radiculopathy.  3-4 days of thick sinus drainage.  Really scrathcy this morning. H/o notable allergies.  Has been really tight and recently got a massage.  [place], [place] and came back.  On the way down, started to get some spasms in her stomach.  Came back and went to [employer].  Then immediate pain.  More on the R.  Down th efront of the R leg.  Coughing will make it worse.  Has had a harder time standing up straight.  Started Sat.   4 ibuprofen. Multiple times a day.  Some tylenol pm.   No numbness or tingling. No bowel or bladder incontinence. No focal weakness. Prior interventions: None right now.  PT and massage distantly Physical therapy: In the past Chiropractic manipulations: No Acupuncture: No Osteopathic manipulation: No Heat or cold: Minimal effect   Review of Systems is noted in the HPI, as appropriate  Objective:   Blood pressure 120/78, pulse 99, temperature 98.2 F  (36.8 C), temperature source Temporal, height 6' 0.5" (1.842 m), weight 195 lb 8 oz (88.7 kg), SpO2 98 %.  GEN: No acute distress; alert,appropriate. PULM: Breathing comfortably in no respiratory distress PSYCH: Normally interactive.   Range of motion at  the waist: Flexion, rotation and lateral bending: full  No echymosis or edema Rises to examination table with no difficulty Gait: minimally antalgic  Inspection/Deformity: No abnormality Paraspinus T:  mild tenderness around L5-s1, more on the r  B Ankle Dorsiflexion (L5,4): 5/5 B Great Toe Dorsiflexion (L5,4): 5/5 Heel Walk (L5): WNL Toe Walk (S1): WNL Rise/Squat (L4): WNL, mild pain  SENSORY B Medial Foot (L4): WNL B Dorsum (L5): WNL B Lateral (S1): WNL Light Touch: WNL Pinprick: WNL  REFLEXES Knee (L4): 2+ Ankle (S1): 2+  B SLR, seated: neg B SLR, supine: neg B FABER: increased back pain B Reverse FABER: neg B Greater Troch: NT B Log Roll: neg B Stork: NT B Sciatic Notch: TTP B, with more on the R  Radiology: Results for orders placed or performed in visit on 08/11/21  POC COVID-19  Result Value Ref Range   SARS Coronavirus 2 Ag Negative Negative     Assessment and Plan:     ICD-10-CM   1. Acute right-sided low back pain without sciatica  M54.50     2. Sinus drainage  J34.89 POC COVID-19    3. Viral sinusitis  J32.9    B97.89  Acute on chronic back pain with exacerbation.  Anatomy reviewed. Conservative algorithms for acute back pain generally begin with the following: NSAIDS, Muscle Relaxants, Mild pain medication.  Chronic symptoms, failure of NSAIDs, going to give him a burst of some steroids. Start with medications, core rehab, and progress from there following low back pain algorithm. No red flags are present.  Likely viral sinusitis.  With Christmas season and office closures for a relatively extended time, I am going to give him some antibiotics to hold.  Follow-up: No follow-ups on  file.  Meds ordered this encounter  Medications   predniSONE (DELTASONE) 20 MG tablet    Sig: 2 tabs po for 4 days, then 1 tab po for 4 days    Dispense:  12 tablet    Refill:  0   cyclobenzaprine (FLEXERIL) 10 MG tablet    Sig: Take 0.5-1 tablets (5-10 mg total) by mouth at bedtime as needed for muscle spasms.    Dispense:  30 tablet    Refill:  1   amoxicillin-clavulanate (AUGMENTIN) 875-125 MG tablet    Sig: Take 1 tablet by mouth 2 (two) times daily.    Dispense:  20 tablet    Refill:  0   Medications Discontinued During This Encounter  Medication Reason   tiZANidine (ZANAFLEX) 4 MG tablet Side effect (s)   Orders Placed This Encounter  Procedures   POC COVID-19    Signed,  Feiga Nadel T. Lealon Vanputten, MD   Outpatient Encounter Medications as of 08/11/2021  Medication Sig   albuterol (VENTOLIN HFA) 108 (90 Base) MCG/ACT inhaler Inhale 1-2 puffs into the lungs every 4 (four) hours as needed for wheezing or shortness of breath.   amoxicillin-clavulanate (AUGMENTIN) 875-125 MG tablet Take 1 tablet by mouth 2 (two) times daily.   cyclobenzaprine (FLEXERIL) 10 MG tablet Take 0.5-1 tablets (5-10 mg total) by mouth at bedtime as needed for muscle spasms.   esomeprazole (NEXIUM) 20 MG capsule Take 1 capsule (20 mg total) by mouth daily.   lisinopril-hydrochlorothiazide (ZESTORETIC) 20-12.5 MG tablet Take 1 tablet by mouth daily.   metoprolol succinate (TOPROL-XL) 50 MG 24 hr tablet TAKE 1 TABLET BY MOUTH ONCE A DAY WITH OR IMMEDIATELY FOLLOWING A MEAL.   montelukast (SINGULAIR) 10 MG tablet TAKE 1 TABLET BY MOUTH EVERY NIGHT AT BEDTIME   Multiple Vitamin (MULTIVITAMIN) tablet Take 1 tablet by mouth daily.   predniSONE (DELTASONE) 20 MG tablet 2 tabs po for 4 days, then 1 tab po for 4 days   rosuvastatin (CRESTOR) 10 MG tablet Take 1 tablet (10 mg total) by mouth daily.   SYMBICORT 160-4.5 MCG/ACT inhaler INHALE 1 PUFF INTO THE LUNGS TWICE DAILYRINSE, GARGLE AND SPIT AFTER USE.    [DISCONTINUED] tiZANidine (ZANAFLEX) 4 MG tablet Take 1 tablet (4 mg total) by mouth at bedtime as needed for muscle spasms.   No facility-administered encounter medications on file as of 08/11/2021.

## 2021-08-12 ENCOUNTER — Telehealth: Payer: Self-pay | Admitting: Family Medicine

## 2021-08-12 ENCOUNTER — Encounter: Payer: Self-pay | Admitting: Family Medicine

## 2021-08-12 NOTE — Telephone Encounter (Signed)
Pt called stating that he had an appt with Dr Patsy Lager on 08/11/21 at 8:40. Pt is asking if you would seen a note to his My Chart stating that he can return back to work. Pt states he need the note sent today so he can return back to work on tomorrow. Please advise.

## 2021-08-12 NOTE — Telephone Encounter (Signed)
Work note written as requested and sent to [employer]  Patient notified of this via MyChart.

## 2021-08-20 DIAGNOSIS — M722 Plantar fascial fibromatosis: Secondary | ICD-10-CM

## 2021-08-22 ENCOUNTER — Other Ambulatory Visit: Payer: Self-pay | Admitting: Internal Medicine

## 2021-08-30 DIAGNOSIS — M62830 Muscle spasm of back: Secondary | ICD-10-CM | POA: Diagnosis not present

## 2021-08-30 DIAGNOSIS — M9903 Segmental and somatic dysfunction of lumbar region: Secondary | ICD-10-CM | POA: Diagnosis not present

## 2021-08-30 DIAGNOSIS — M9904 Segmental and somatic dysfunction of sacral region: Secondary | ICD-10-CM | POA: Diagnosis not present

## 2021-08-30 DIAGNOSIS — M5127 Other intervertebral disc displacement, lumbosacral region: Secondary | ICD-10-CM | POA: Diagnosis not present

## 2021-09-09 DIAGNOSIS — M9904 Segmental and somatic dysfunction of sacral region: Secondary | ICD-10-CM | POA: Diagnosis not present

## 2021-09-09 DIAGNOSIS — M62830 Muscle spasm of back: Secondary | ICD-10-CM | POA: Diagnosis not present

## 2021-09-09 DIAGNOSIS — M5127 Other intervertebral disc displacement, lumbosacral region: Secondary | ICD-10-CM | POA: Diagnosis not present

## 2021-09-09 DIAGNOSIS — M9903 Segmental and somatic dysfunction of lumbar region: Secondary | ICD-10-CM | POA: Diagnosis not present

## 2021-09-15 ENCOUNTER — Telehealth: Payer: Self-pay | Admitting: Podiatry

## 2021-09-15 NOTE — Telephone Encounter (Signed)
Refurbished orthotics in [place] to be taken to [place]..  Lvm for pt ok to pick up in [place] as of 1.30.2023.Marland Kitchen in the afternoon.

## 2021-10-18 DIAGNOSIS — M9904 Segmental and somatic dysfunction of sacral region: Secondary | ICD-10-CM | POA: Diagnosis not present

## 2021-10-18 DIAGNOSIS — M62830 Muscle spasm of back: Secondary | ICD-10-CM | POA: Diagnosis not present

## 2021-10-18 DIAGNOSIS — M5127 Other intervertebral disc displacement, lumbosacral region: Secondary | ICD-10-CM | POA: Diagnosis not present

## 2021-10-18 DIAGNOSIS — M9903 Segmental and somatic dysfunction of lumbar region: Secondary | ICD-10-CM | POA: Diagnosis not present

## 2021-11-02 ENCOUNTER — Other Ambulatory Visit: Payer: Self-pay | Admitting: Internal Medicine

## 2021-11-22 ENCOUNTER — Ambulatory Visit: Payer: BC Managed Care – PPO | Admitting: Medical

## 2021-11-22 ENCOUNTER — Encounter: Payer: Self-pay | Admitting: Medical

## 2021-11-22 VITALS — BP 112/80 | HR 96 | Temp 97.1°F | Resp 16

## 2021-11-22 DIAGNOSIS — J302 Other seasonal allergic rhinitis: Secondary | ICD-10-CM

## 2021-11-22 DIAGNOSIS — J9801 Acute bronchospasm: Secondary | ICD-10-CM

## 2021-11-22 DIAGNOSIS — H6983 Other specified disorders of Eustachian tube, bilateral: Secondary | ICD-10-CM

## 2021-11-22 MED ORDER — PREDNISONE 10 MG (21) PO TBPK: ORAL_TABLET | ORAL | 0 refills | Status: DC

## 2021-11-22 NOTE — Progress Notes (Signed)
? ?Subjective:  ? ? Patient ID: Weston Anna, male    DOB: 1976/03/08, 46 y.o.   MRN: [id number] ? ?HPI ?46 yo male in non acute distress today presents with ?Having post nasal drip , fatigue from not sleeping due to PND and coughing it up. ?Taking his asthma medication and claritin D ( tiwice dialy) and Flonase. ? ?Blood pressure 112/80, pulse 96, temperature (!) 97.1 ?F (36.2 ?C), temperature source Tympanic, resp. rate 16, SpO2 98 %. ? ?Allergies  ?Allergen Reactions  ? Oxycodone-Acetaminophen   ?  REACTION: extemely dizzy with nausea and vomiting.  ? Zanaflex [Tizanidine Hcl] Other (See Comments)  ?  Felt unusual and had some vivid visuals  ? ? ? ? ? ?Past Medical History:  ?Diagnosis Date  ? Diabetes mellitus type 2, diet-controlled ([facility]) 03/04/2021  ? GERD (gastroesophageal reflux disease)   ? Hyperlipidemia LDL goal <70 03/05/2021  ? Mild obstructive sleep apnea, controlled with dental appliance   ? Moderate persistent asthma   ? Sleep apnea   ?  ? ?Review of Systems  ?Constitutional:  Positive for fatigue.  ?HENT:  Positive for congestion, ear pain, rhinorrhea, sinus pressure (frontal), sinus pain (forehead) and sore throat (mild).   ?Respiratory:  Positive for cough.   ? ?   ?Objective:  ? Physical Exam ?Vitals and nursing note reviewed.  ?Constitutional:   ?   Appearance: Normal appearance.  ?HENT:  ?   Head: Normocephalic and atraumatic.  ?   Right Ear: Ear canal and external ear normal. A middle ear effusion is present.  ?   Left Ear: Ear canal and external ear normal. A middle ear effusion is present.  ?   Mouth/Throat:  ?   Mouth: Mucous membranes are moist.  ?   Pharynx: Oropharynx is clear.  ?Eyes:  ?   Extraocular Movements: Extraocular movements intact.  ?   Conjunctiva/sclera: Conjunctivae normal.  ?   Pupils: Pupils are equal, round, and reactive to light.  ?Cardiovascular:  ?   Rate and Rhythm: Normal rate and regular rhythm.  ?Pulmonary:  ?   Effort: Pulmonary effort is normal.  ?   Breath  sounds: Normal breath sounds.  ?Musculoskeletal:     ?   General: Normal range of motion.  ?   Cervical back: Normal range of motion and neck supple.  ?Skin: ?   General: Skin is warm and dry.  ?   Capillary Refill: Capillary refill takes less than 2 seconds.  ?Neurological:  ?   General: No focal deficit present.  ?   Mental Status: He is alert and oriented to person, place, and time. Mental status is at baseline.  ?Psychiatric:     ?   Mood and Affect: Mood normal.     ?   Behavior: Behavior normal.     ?   Thought Content: Thought content normal.  ? ? ? ? ? ?   ?Assessment & Plan:  ?Seasonal allergies continue allergy medication ?Eustachian tube dysfunction. ?Avoid taking claritin D evening  dose , this may be affecting your sleep. ?Meds ordered this encounter  ?Medications  ? predniSONE (STERAPRED UNI-PAK 21 TAB) 10 MG (21) TBPK tablet  ?  Sig: 6 tablets by mouth today then 5 tablets tomorrow then one less tablet every day there after. Take with food.  ?  Dispense:  21 tablet  ?  Refill:  0  ? Bronchospasm use albuterol MDIO 2-3 times / day x 3 days. ?If productive  cough is green call office for antibiotics. ?Patient verbalizes understanding and has no questions at discharge. ?

## 2021-11-24 ENCOUNTER — Encounter: Payer: Self-pay | Admitting: Family Medicine

## 2021-11-24 DIAGNOSIS — J302 Other seasonal allergic rhinitis: Secondary | ICD-10-CM

## 2022-01-04 ENCOUNTER — Other Ambulatory Visit: Payer: Self-pay | Admitting: Internal Medicine

## 2022-01-05 ENCOUNTER — Other Ambulatory Visit: Payer: Self-pay | Admitting: Family Medicine

## 2022-02-15 DIAGNOSIS — J3089 Other allergic rhinitis: Secondary | ICD-10-CM | POA: Diagnosis not present

## 2022-03-07 ENCOUNTER — Other Ambulatory Visit: Payer: Self-pay | Admitting: Internal Medicine

## 2022-03-21 ENCOUNTER — Other Ambulatory Visit: Payer: Self-pay | Admitting: Family Medicine

## 2022-04-15 ENCOUNTER — Other Ambulatory Visit: Payer: Self-pay | Admitting: Family Medicine

## 2022-04-15 NOTE — Telephone Encounter (Signed)
Patient has been scheduled

## 2022-04-15 NOTE — Telephone Encounter (Signed)
Please schedule CPE with fasting labs prior with Dr. Copland.  

## 2022-04-19 ENCOUNTER — Encounter: Payer: Self-pay | Admitting: Nurse Practitioner

## 2022-04-19 ENCOUNTER — Ambulatory Visit (INDEPENDENT_AMBULATORY_CARE_PROVIDER_SITE_OTHER): Payer: Self-pay | Admitting: Family

## 2022-04-19 VITALS — BP 116/70 | HR 70 | Temp 98.9°F

## 2022-04-19 DIAGNOSIS — M79671 Pain in right foot: Secondary | ICD-10-CM

## 2022-04-19 DIAGNOSIS — M25571 Pain in right ankle and joints of right foot: Secondary | ICD-10-CM | POA: Diagnosis not present

## 2022-04-19 NOTE — Progress Notes (Signed)
[facility] [address] [place], [place] [address]   Office Visit Note  Patient Name: Antonio Lara  [id number]  [id number]  Date of Service: 04/19/2022  Chief Complaint  Patient presents with   Foot Injury    Not sure what happened to right foot. After he went to bed he was woke up with pain in foot. Cant tell if there is any swelling.. Pain is going up leg.      Foot Injury    Pt presents with c/o acute left foot pain.  Pain developed in the middle of the night.  No otc meds.  No known injury or culprit.  No hx of similar pain. Pain is worse with weight bearing.  2/3 of 10 sitting, 8 of 10 standing.  Pt is a security guard on campus night shift.     Current Medication:  Outpatient Encounter Medications as of 04/19/2022  Medication Sig   albuterol (VENTOLIN HFA) 108 (90 Base) MCG/ACT inhaler Inhale 1-2 puffs into the lungs every 4 (four) hours as needed for wheezing or shortness of breath.   esomeprazole (NEXIUM) 20 MG capsule Take 1 capsule (20 mg total) by mouth daily.   Fluticasone-Umeclidin-Vilant (TRELEGY ELLIPTA) 200-62.5-25 MCG/ACT AEPB 1 puff   lisinopril-hydrochlorothiazide (ZESTORETIC) 20-12.5 MG tablet Take 1 tablet by mouth daily.   metoprolol succinate (TOPROL-XL) 50 MG 24 hr tablet TAKE 1 TABLET BY MOUTH ONCE A DAY WITH OR IMMEDIATELY FOLLOWING A MEAL.   montelukast (SINGULAIR) 10 MG tablet TAKE 1 TABLET BY MOUTH EVERY NIGHT AT BEDTIME   Multiple Vitamin (MULTIVITAMIN) tablet Take 1 tablet by mouth daily.   rosuvastatin (CRESTOR) 10 MG tablet TAKE 1 TABLET BY MOUTH ONCE A DAY   amoxicillin-clavulanate (AUGMENTIN) 875-125 MG tablet Take 1 tablet by mouth 2 (two) times daily.   cyclobenzaprine (FLEXERIL) 10 MG tablet Take 0.5-1 tablets (5-10 mg total) by mouth at bedtime as needed for muscle spasms. (Patient not taking: Reported on 04/19/2022)   predniSONE (STERAPRED UNI-PAK 21 TAB) 10 MG (21) TBPK tablet 6 tablets by mouth today then 5 tablets  tomorrow then one less tablet every day there after. Take with food. (Patient not taking: Reported on 04/19/2022)   SYMBICORT 160-4.5 MCG/ACT inhaler INHALE 1 PUFF INTO THE LUNGS TWICE DAILYRINSE, GARGLE AND SPIT AFTER USE. (Patient not taking: Reported on 04/19/2022)   No facility-administered encounter medications on file as of 04/19/2022.      Medical History: Past Medical History:  Diagnosis Date   Diabetes mellitus type 2, diet-controlled (HCC) 03/04/2021   GERD (gastroesophageal reflux disease)    Hyperlipidemia LDL goal <70 03/05/2021   Mild obstructive sleep apnea, controlled with dental appliance    Moderate persistent asthma    Sleep apnea      Vital Signs: BP 116/70 (BP Location: Left Arm, Patient Position: Sitting, Cuff Size: Normal)   Pulse 70   Temp 98.9 F (37.2 C) (Tympanic)   SpO2 99%    Review of Systems  Constitutional:  Positive for activity change.  Musculoskeletal:  Positive for gait problem and joint swelling.  Skin:  Positive for color change.    Physical Exam Constitutional:      Appearance: Normal appearance. He is normal weight.  Musculoskeletal:     Comments: Moderate point tenderness to right lateral dorsal foot at junction of tarsals and metatarsals.  Mild warmth to touch.  Full ROM of right ankle and toes.  No bruising.  Neurological:     Mental Status: He is  alert.       Assessment/Plan: Reviewed exam findings.  Start otc Advil 800mg  q 8 (samples given here).  Ice.  Seek eval from [facility] for x-ray and possible orthotic boot.  Rtc prn.  General Counseling: Antonio Lara verbalizes understanding of the findings of todays visit and agrees with plan of treatment. I have discussed any further diagnostic evaluation that may be needed or ordered today. We also reviewed his medications today. he has been encouraged to call the office with any questions or concerns that should arise related to todays visit.   No orders of the defined types were  placed in this encounter.   No orders of the defined types were placed in this encounter.     Time spent: 10 Minutes  , FNP-C

## 2022-05-02 ENCOUNTER — Other Ambulatory Visit: Payer: Self-pay | Admitting: Family Medicine

## 2022-05-02 DIAGNOSIS — Z131 Encounter for screening for diabetes mellitus: Secondary | ICD-10-CM

## 2022-05-02 DIAGNOSIS — E785 Hyperlipidemia, unspecified: Secondary | ICD-10-CM

## 2022-05-02 DIAGNOSIS — Z114 Encounter for screening for human immunodeficiency virus [HIV]: Secondary | ICD-10-CM

## 2022-05-02 DIAGNOSIS — Z79899 Other long term (current) drug therapy: Secondary | ICD-10-CM

## 2022-05-02 DIAGNOSIS — E119 Type 2 diabetes mellitus without complications: Secondary | ICD-10-CM

## 2022-05-02 DIAGNOSIS — Z1159 Encounter for screening for other viral diseases: Secondary | ICD-10-CM

## 2022-05-04 ENCOUNTER — Other Ambulatory Visit (INDEPENDENT_AMBULATORY_CARE_PROVIDER_SITE_OTHER): Payer: BC Managed Care – PPO

## 2022-05-04 DIAGNOSIS — Z79899 Other long term (current) drug therapy: Secondary | ICD-10-CM | POA: Diagnosis not present

## 2022-05-04 DIAGNOSIS — Z114 Encounter for screening for human immunodeficiency virus [HIV]: Secondary | ICD-10-CM | POA: Diagnosis not present

## 2022-05-04 DIAGNOSIS — E785 Hyperlipidemia, unspecified: Secondary | ICD-10-CM

## 2022-05-04 DIAGNOSIS — Z1159 Encounter for screening for other viral diseases: Secondary | ICD-10-CM

## 2022-05-04 DIAGNOSIS — E119 Type 2 diabetes mellitus without complications: Secondary | ICD-10-CM

## 2022-05-05 LAB — MICROALBUMIN / CREATININE URINE RATIO
Creatinine,U: 332.5 mg/dL
Microalb Creat Ratio: 0.6 mg/g (ref 0.0–30.0)
Microalb, Ur: 1.9 mg/dL (ref 0.0–1.9)

## 2022-05-05 LAB — BASIC METABOLIC PANEL
BUN: 16 mg/dL (ref 6–23)
CO2: 29 mEq/L (ref 19–32)
Calcium: 9.9 mg/dL (ref 8.4–10.5)
Chloride: 101 mEq/L (ref 96–112)
Creatinine, Ser: 1.16 mg/dL (ref 0.40–1.50)
GFR: 76.65 mL/min (ref >60.00)
Glucose, Bld: 124 mg/dL — ABNORMAL HIGH (ref 70–99)
Potassium: 3.7 mEq/L (ref 3.5–5.1)
Sodium: 140 mEq/L (ref 135–145)

## 2022-05-05 LAB — HEPATIC FUNCTION PANEL
ALT: 28 U/L (ref 0–53)
AST: 21 U/L (ref 0–37)
Albumin: 4.7 g/dL (ref 3.5–5.2)
Alkaline Phosphatase: 79 U/L (ref 39–117)
Bilirubin, Direct: 0.2 mg/dL (ref 0.0–0.3)
Total Bilirubin: 0.8 mg/dL (ref 0.2–1.2)
Total Protein: 7.4 g/dL (ref 6.0–8.3)

## 2022-05-05 LAB — CBC WITH DIFFERENTIAL/PLATELET
Basophils Absolute: 0.1 10*3/uL (ref 0.0–0.1)
Basophils Relative: 0.7 % (ref 0.0–3.0)
Eosinophils Absolute: 0.1 10*3/uL (ref 0.0–0.7)
Eosinophils Relative: 2 % (ref 0.0–5.0)
HCT: 42.4 % (ref 39.0–52.0)
Hemoglobin: 14.5 g/dL (ref 13.0–17.0)
Lymphocytes Relative: 26 % (ref 12.0–46.0)
Lymphs Abs: 1.9 10*3/uL (ref 0.7–4.0)
MCHC: 34.3 g/dL (ref 30.0–36.0)
MCV: 84.6 fl (ref 78.0–100.0)
Monocytes Absolute: 0.5 10*3/uL (ref 0.1–1.0)
Monocytes Relative: 7.3 % (ref 3.0–12.0)
Neutro Abs: 4.7 10*3/uL (ref 1.4–7.7)
Neutrophils Relative %: 64 % (ref 43.0–77.0)
Platelets: 247 10*3/uL (ref 150.0–400.0)
RBC: 5.01 Mil/uL (ref 4.22–5.81)
RDW: 13.2 % (ref 11.5–15.5)
WBC: 7.3 10*3/uL (ref 4.0–10.5)

## 2022-05-05 LAB — LIPID PANEL
Cholesterol: 148 mg/dL (ref 0–200)
HDL: 47.4 mg/dL (ref >39.00)
LDL Cholesterol: 70 mg/dL (ref 0–99)
NonHDL: 100.91
Total CHOL/HDL Ratio: 3
Triglycerides: 154 mg/dL — ABNORMAL HIGH (ref 0.0–149.0)
VLDL: 30.8 mg/dL (ref 0.0–40.0)

## 2022-05-05 LAB — HEMOGLOBIN A1C: Hgb A1c MFr Bld: 6.3 % (ref 4.6–6.5)

## 2022-05-05 LAB — HIV ANTIBODY (ROUTINE TESTING W REFLEX): HIV 1&2 Ab, 4th Generation: NONREACTIVE

## 2022-05-05 LAB — HEPATITIS C ANTIBODY: Hepatitis C Ab: NONREACTIVE

## 2022-05-06 ENCOUNTER — Ambulatory Visit (INDEPENDENT_AMBULATORY_CARE_PROVIDER_SITE_OTHER): Payer: Self-pay | Admitting: Nurse Practitioner

## 2022-05-06 ENCOUNTER — Encounter: Payer: Self-pay | Admitting: Nurse Practitioner

## 2022-05-06 VITALS — BP 120/80 | HR 78 | Temp 98.1°F

## 2022-05-06 DIAGNOSIS — J3081 Allergic rhinitis due to animal (cat) (dog) hair and dander: Secondary | ICD-10-CM | POA: Insufficient documentation

## 2022-05-06 DIAGNOSIS — J40 Bronchitis, not specified as acute or chronic: Secondary | ICD-10-CM

## 2022-05-06 DIAGNOSIS — J4530 Mild persistent asthma, uncomplicated: Secondary | ICD-10-CM | POA: Insufficient documentation

## 2022-05-06 DIAGNOSIS — J4540 Moderate persistent asthma, uncomplicated: Secondary | ICD-10-CM | POA: Insufficient documentation

## 2022-05-06 DIAGNOSIS — J300 Vasomotor rhinitis: Secondary | ICD-10-CM | POA: Insufficient documentation

## 2022-05-06 DIAGNOSIS — H1045 Other chronic allergic conjunctivitis: Secondary | ICD-10-CM | POA: Insufficient documentation

## 2022-05-06 LAB — POC COVID19 BINAXNOW: SARS Coronavirus 2 Ag: NEGATIVE

## 2022-05-06 MED ORDER — AZITHROMYCIN 250 MG PO TABS: ORAL_TABLET | ORAL | 0 refills | Status: DC

## 2022-05-06 MED ORDER — ALBUTEROL SULFATE HFA 108 (90 BASE) MCG/ACT IN AERS: 2.0000 | INHALATION_SPRAY | Freq: Four times a day (QID) | RESPIRATORY_TRACT | 0 refills | Status: DC | PRN

## 2022-05-06 NOTE — Progress Notes (Signed)
[facility] [address] [place], [place] [address] [phone]  Office Visit Note  Patient Name: Antonio Lara Date of Birth [id number]  Medical Record number [id number]  Date of Service: 05/06/2022   HPI 46 year old male presenting to [facility] with complaints of sinus congestion& cough for the past   week.   Started in his sinuses and moved to chest.   He has a history of asthma  Denies fever  Cough is deep unable to produce sputum     Current Medication:  Current Outpatient Medications  Medication Instructions   albuterol (VENTOLIN HFA) 108 (90 Base) MCG/ACT inhaler 1-2 puffs, Inhalation, Every 4 hours PRN   albuterol (VENTOLIN HFA) 108 (90 Base) MCG/ACT inhaler 2 puffs, Inhalation, Every 6 hours PRN   azelastine (OPTIVAR) 0.05 % ophthalmic solution 1 drop into affected eye   azithromycin (ZITHROMAX) 250 MG tablet Take 2 tablets on day 1, then 1 tablet daily on days 2 through 5   cyclobenzaprine (FLEXERIL) 5-10 mg, Oral, At bedtime PRN   esomeprazole (NEXIUM) 20 mg, Oral, Daily   fluticasone (FLONASE) 50 MCG/ACT nasal spray Each Nare   Fluticasone-Umeclidin-Vilant (TRELEGY ELLIPTA) 200-62.5-25 MCG/ACT AEPB 1 puff   lisinopril-hydrochlorothiazide (ZESTORETIC) 20-12.5 MG tablet 1 tablet, Oral, Daily   metoprolol succinate (TOPROL-XL) 50 MG 24 hr tablet TAKE 1 TABLET BY MOUTH ONCE A DAY WITH OR IMMEDIATELY FOLLOWING A MEAL.   montelukast (SINGULAIR) 10 MG tablet TAKE 1 TABLET BY MOUTH EVERY NIGHT AT BEDTIME   Multiple Vitamin (MULTIVITAMIN) tablet 1 tablet, Daily   rosuvastatin (CRESTOR) 10 mg, Oral, Daily        Medical History: Past Medical History:  Diagnosis Date   Diabetes mellitus type 2, diet-controlled (HCC) 03/04/2021   GERD (gastroesophageal reflux disease)    Hyperlipidemia LDL goal <70 03/05/2021   Mild obstructive sleep apnea, controlled with dental appliance    Moderate persistent asthma    Sleep apnea      Vital  Signs: Today's Vitals   05/06/22 0846  BP: 120/80  Pulse: 78  Temp: 98.1 F (36.7 C)  TempSrc: Tympanic  SpO2: 98%   There is no height or weight on file to calculate BMI.   Review of Systems  Constitutional:  Positive for fatigue.  HENT:  Positive for congestion, sinus pressure and sore throat.   Respiratory:  Positive for cough and wheezing.   Cardiovascular: Negative.   Genitourinary: Negative.   Musculoskeletal: Negative.   Neurological: Negative.   Psychiatric/Behavioral: Negative.      Physical Exam HENT:     Head: Normocephalic.     Right Ear: Tympanic membrane, ear canal and external ear normal.     Left Ear: Tympanic membrane, ear canal and external ear normal.     Nose: Congestion present.     Mouth/Throat:     Mouth: Mucous membranes are moist.  Eyes:     Pupils: Pupils are equal, round, and reactive to light.  Cardiovascular:     Rate and Rhythm: Normal rate and regular rhythm.     Heart sounds: Normal heart sounds.  Pulmonary:     Effort: Pulmonary effort is normal.     Breath sounds: Examination of the right-lower field reveals decreased breath sounds. Examination of the left-lower field reveals decreased breath sounds. Decreased breath sounds present.  Musculoskeletal:     Cervical back: Normal range of motion.  Skin:    General: Skin is warm.  Neurological:  General: No focal deficit present.     Mental Status: He is alert.     Recent Results (from the past 2160 hour(s))  HIV Antibody (routine testing w rflx)     Status: None   Collection Time: 05/04/22  4:01 PM  Result Value Ref Range   HIV 1&2 Ab, 4th Generation NON-REACTIVE NON-REACTIVE    Comment: HIV-1 antigen and HIV-1/HIV-2 antibodies were not detected. There is no laboratory evidence of HIV infection. Marland Kitchen PLEASE NOTE: This information has been disclosed to you from records whose confidentiality may be protected by state law.  If your state requires such protection, then the state  law prohibits you from making any further disclosure of the information without the specific written consent of the person to whom it pertains, or as otherwise permitted by law. A general authorization for the release of medical or other information is NOT sufficient for this purpose. . For additional information please refer to http://education.questdiagnostics.com/faq/FAQ106 (This link is being provided for informational/ educational purposes only.) . Marland Kitchen The performance of this assay has not been clinically validated in patients less than 79 years old. .   Hepatitis C antibody     Status: None   Collection Time: 05/04/22  4:01 PM  Result Value Ref Range   Hepatitis C Ab NON-REACTIVE NON-REACTIVE    Comment: . HCV antibody was non-reactive. There is no laboratory  evidence of HCV infection. . In most cases, no further action is required. However, if recent HCV exposure is suspected, a test for HCV RNA (test code 33354) is suggested. . For additional information please refer to http://education.questdiagn[facility].com/faq/FAQ22v1 (This link is being provided for informational/ educational purposes only.) .   CBC with Differential/Platelet     Status: None   Collection Time: 05/04/22  4:01 PM  Result Value Ref Range   WBC 7.3 4.0 - 10.5 K/uL   RBC 5.01 4.22 - 5.81 Mil/uL   Hemoglobin 14.5 13.0 - 17.0 g/dL   HCT 56.2 56.3 - 89.3 %   MCV 84.6 78.0 - 100.0 fl   MCHC 34.3 30.0 - 36.0 g/dL   RDW 73.4 28.7 - 68.1 %   Platelets 247.0 150.0 - 400.0 K/uL   Neutrophils Relative % 64.0 43.0 - 77.0 %   Lymphocytes Relative 26.0 12.0 - 46.0 %   Monocytes Relative 7.3 3.0 - 12.0 %   Eosinophils Relative 2.0 0.0 - 5.0 %   Basophils Relative 0.7 0.0 - 3.0 %   Neutro Abs 4.7 1.4 - 7.7 K/uL   Lymphs Abs 1.9 0.7 - 4.0 K/uL   Monocytes Absolute 0.5 0.1 - 1.0 K/uL   Eosinophils Absolute 0.1 0.0 - 0.7 K/uL   Basophils Absolute 0.1 0.0 - 0.1 K/uL  Basic metabolic panel     Status: Abnormal    Collection Time: 05/04/22  4:01 PM  Result Value Ref Range   Sodium 140 135 - 145 mEq/L   Potassium 3.7 3.5 - 5.1 mEq/L   Chloride 101 96 - 112 mEq/L   CO2 29 19 - 32 mEq/L   Glucose, Bld 124 (H) 70 - 99 mg/dL   BUN 16 6 - 23 mg/dL   Creatinine, Ser 1.57 0.40 - 1.50 mg/dL   GFR 26.20 >35.59 mL/min    Comment: Calculated using the CKD-EPI Creatinine Equation (2021)   Calcium 9.9 8.4 - 10.5 mg/dL  Hepatic function panel     Status: None   Collection Time: 05/04/22  4:01 PM  Result Value Ref Range  Total Bilirubin 0.8 0.2 - 1.2 mg/dL   Bilirubin, Direct 0.2 0.0 - 0.3 mg/dL   Alkaline Phosphatase 79 39 - 117 U/L   AST 21 0 - 37 U/L   ALT 28 0 - 53 U/L   Total Protein 7.4 6.0 - 8.3 g/dL   Albumin 4.7 3.5 - 5.2 g/dL  Microalbumin / creatinine urine ratio     Status: None   Collection Time: 05/04/22  4:01 PM  Result Value Ref Range   Microalb, Ur 1.9 0.0 - 1.9 mg/dL   Creatinine,U 062.6 mg/dL   Microalb Creat Ratio 0.6 0.0 - 30.0 mg/g  Hemoglobin A1c     Status: None   Collection Time: 05/04/22  4:01 PM  Result Value Ref Range   Hgb A1c MFr Bld 6.3 4.6 - 6.5 %    Comment: Glycemic Control Guidelines for People with Diabetes:Non Diabetic:  <6%Goal of Therapy: <7%Additional Action Suggested:  >8%   Lipid panel     Status: Abnormal   Collection Time: 05/04/22  4:01 PM  Result Value Ref Range   Cholesterol 148 0 - 200 mg/dL    Comment: ATP III Classification       Desirable:  < 200 mg/dL               Borderline High:  200 - 239 mg/dL          High:  > = 948 mg/dL   Triglycerides 546.2 (H) 0.0 - 149.0 mg/dL    Comment: Normal:  <703 mg/dLBorderline High:  150 - 199 mg/dL   HDL 50.09 >38.18 mg/dL   VLDL 29.9 0.0 - 37.1 mg/dL   LDL Cholesterol 70 0 - 99 mg/dL   Total CHOL/HDL Ratio 3     Comment:                Men          Women1/2 Average Risk     3.4          3.3Average Risk          5.0          4.42X Average Risk          9.6          7.13X Average Risk          15.0           11.0                       NonHDL 100.91     Comment: NOTE:  Non-HDL goal should be 30 mg/dL higher than patient's LDL goal (i.e. LDL goal of < 70 mg/dL, would have non-HDL goal of < 100 mg/dL)  POC [id number]     Status: Normal   Collection Time: 05/06/22  8:47 AM  Result Value Ref Range   SARS Coronavirus 2 Ag Negative Negative     Assessment/Plan: 1. Bronchitis Advised Mucinex OTC   - albuterol (VENTOLIN HFA) 108 (90 Base) MCG/ACT inhaler; Inhale 2 puffs into the lungs every 6 (six) hours as needed for wheezing or shortness of breath.  Dispense: 8 g; Refill: 0 - azithromycin (ZITHROMAX) 250 MG tablet; Take 2 tablets on day 1, then 1 tablet daily on days 2 through 5  Dispense: 6 tablet; Refill: 0 - POC COVID-19    General Counseling: Deondre verbalizes understanding of the findings of todays visit and agrees with plan of treatment. I have discussed any further diagnostic  evaluation that may be needed or ordered today. We also reviewed his medications today. he has been encouraged to call the office with any questions or concerns that should arise related to todays visit.     Time spent:10 Minutes   Viviano Simas [facility] Family Nurse Practitioner

## 2022-05-09 NOTE — Progress Notes (Unsigned)
Antonio Valentine T. Goldia Ligman, MD, CAQ Sports Medicine [facility] [address] [place], [address]  Phone: [phone]  FAX: [phone]  Antonio Lara - 46 y.o. male  MRN [id number]  Date of Birth: Jan 02, 1976  Date: 05/11/2022  PCP: Hannah Beat, MD  Referral: Hannah Beat, MD  No chief complaint on file.  Patient Care Team: Hannah Beat, MD as PCP - General Subjective:   Antonio Lara is a 46 y.o. pleasant patient who presents with the following:  Preventative Health Maintenance Visit:  Health Maintenance Summary Reviewed and updated, unless pt declines services.  Tobacco History Reviewed. Alcohol: No concerns, no excessive use Exercise Habits: Some activity, rec at least 30 mins 5 times a week STD concerns: no risk or activity to increase risk Drug Use: None  Diabetes Mellitus: Tolerating Medications: yes Compliance with diet: fair, There is no height or weight on file to calculate BMI. Exercise: minimal / intermittent Avg blood sugars at home: not checking Foot problems: none Hypoglycemia: none No nausea, vomitting, blurred vision, polyuria.  Foot Eye Covid booster flu  Lab Results  Component Value Date   HGBA1C 6.3 05/04/2022   HGBA1C 5.6 06/10/2021   HGBA1C 6.7 (H) 02/23/2021   Lab Results  Component Value Date   MICROALBUR 1.9 05/04/2022   LDLCALC 70 05/04/2022   CREATININE 1.16 05/04/2022    Wt Readings from Last 3 Encounters:  08/11/21 195 lb 8 oz (88.7 kg)  06/15/21 187 lb 9.6 oz (85.1 kg)  06/10/21 187 lb 6 oz (85 kg)     Health Maintenance  Topic Date Due   FOOT EXAM  Never done   OPHTHALMOLOGY EXAM  Never done   COVID-19 Vaccine (2 - Janssen risk series) 01/29/2020   INFLUENZA VACCINE  03/22/2022   HEMOGLOBIN A1C  11/02/2022   Diabetic kidney evaluation - GFR measurement  05/05/2023   Diabetic kidney evaluation - Urine ACR  05/05/2023   TETANUS/TDAP  07/14/2029   Hepatitis C  Screening  Completed   HIV Screening  Completed   HPV VACCINES  Aged Out   Immunization History  Administered Date(s) Administered   Influenza,inj,Quad PF,6+ Mos 07/09/2015, 05/29/2019   Influenza-Unspecified 05/13/2020, 06/08/2021, 06/08/2021   Janssen (J&J) SARS-COV-2 Vaccination 01/01/2020   Pneumococcal Polysaccharide-23 07/09/2015   Td 08/22/1996, 03/31/2009   Tdap 07/15/2019   Patient Active Problem List   Diagnosis Date Noted   Diabetes mellitus type 2, diet-controlled (HCC) 03/04/2021    Priority: High   Hyperlipidemia LDL goal <70 03/05/2021    Priority: Medium    Hypertension, essential 10/19/2020    Priority: Medium    Moderate persistent asthma with exacerbation 03/21/2019    Priority: Medium    Allergic rhinitis due to animal (cat) (dog) hair and dander 05/06/2022   Chronic allergic conjunctivitis 05/06/2022   Mild persistent asthma, uncomplicated 05/06/2022   Vasomotor rhinitis 05/06/2022   GERD 07/10/2009   Allergic rhinitis 04/08/2008    Past Medical History:  Diagnosis Date   Diabetes mellitus type 2, diet-controlled (HCC) 03/04/2021   GERD (gastroesophageal reflux disease)    Hyperlipidemia LDL goal <70 03/05/2021   Mild obstructive sleep apnea, controlled with dental appliance    Moderate persistent asthma    Sleep apnea     No past surgical history on file.  Family History  Problem Relation Age of Onset   Breast cancer Mother    Stroke Father    Cancer Maternal Grandfather     Social  History   Social History Narrative   Drinks one caffeinated beverage a day.    Past Medical History, Surgical History, Social History, Family History, Problem List, Medications, and Allergies have been reviewed and updated if relevant.  Review of Systems: Pertinent positives are listed above.  Otherwise, a full 14 point review of systems has been done in full and it is negative except where it is noted positive.  Objective:   There were no vitals taken for  this visit. Ideal Body Weight:    Ideal Body Weight:   No results found.    03/04/2021    2:09 PM 07/15/2019    9:13 AM  Depression screen PHQ 2/9  Decreased Interest 0 0  Down, Depressed, Hopeless 0 0  PHQ - 2 Score 0 0     GEN: well developed, well nourished, no acute distress Eyes: conjunctiva and lids normal, PERRLA, EOMI ENT: TM clear, nares clear, oral exam WNL Neck: supple, no lymphadenopathy, no thyromegaly, no JVD Pulm: clear to auscultation and percussion, respiratory effort normal CV: regular rate and rhythm, S1-S2, no murmur, rub or gallop, no bruits, peripheral pulses normal and symmetric, no cyanosis, clubbing, edema or varicosities GI: soft, non-tender; no hepatosplenomegaly, masses; active bowel sounds all quadrants GU: deferred Lymph: no cervical, axillary or inguinal adenopathy MSK: gait normal, muscle tone and strength WNL, no joint swelling, effusions, discoloration, crepitus  SKIN: clear, good turgor, color WNL, no rashes, lesions, or ulcerations Neuro: normal mental status, normal strength, sensation, and motion Psych: alert; oriented to person, place and time, normally interactive and not anxious or depressed in appearance.  All labs reviewed with patient. Results for orders placed or performed in visit on 05/06/22  POC COVID-19  Result Value Ref Range   SARS Coronavirus 2 Ag Negative Negative    Assessment and Plan:     ICD-10-CM   1. Healthcare maintenance  Z00.00       Health Maintenance Exam: The patient's preventative maintenance and recommended screening tests for an annual wellness exam were reviewed in full today. Brought up to date unless services declined.  Counselled on the importance of diet, exercise, and its role in overall health and mortality. The patient's FH and SH was reviewed, including their home life, tobacco status, and drug and alcohol status.  Follow-up in 1 year for physical exam or additional follow-up  below.  Disposition: No follow-ups on file.  No orders of the defined types were placed in this encounter.  There are no discontinued medications. No orders of the defined types were placed in this encounter.   Signed,  Elpidio Galea. Kanton Kamel, MD   Allergies as of 05/11/2022       Reactions   Oxycodone-acetaminophen    REACTION: extemely dizzy with nausea and vomiting.   Zanaflex [tizanidine Hcl] Other (See Comments)   Felt unusual and had some vivid visuals        Medication List        Accurate as of May 09, 2022  6:22 PM. If you have any questions, ask your nurse or doctor.          albuterol 108 (90 Base) MCG/ACT inhaler Commonly known as: VENTOLIN HFA Inhale 1-2 puffs into the lungs every 4 (four) hours as needed for wheezing or shortness of breath.   albuterol 108 (90 Base) MCG/ACT inhaler Commonly known as: VENTOLIN HFA Inhale 2 puffs into the lungs every 6 (six) hours as needed for wheezing or shortness of breath.   azelastine  0.05 % ophthalmic solution Commonly known as: OPTIVAR 1 drop into affected eye   azithromycin 250 MG tablet Commonly known as: ZITHROMAX Take 2 tablets on day 1, then 1 tablet daily on days 2 through 5   cyclobenzaprine 10 MG tablet Commonly known as: FLEXERIL Take 0.5-1 tablets (5-10 mg total) by mouth at bedtime as needed for muscle spasms.   esomeprazole 20 MG capsule Commonly known as: NEXIUM Take 1 capsule (20 mg total) by mouth daily.   fluticasone 50 MCG/ACT nasal spray Commonly known as: FLONASE Place into both nostrils.   lisinopril-hydrochlorothiazide 20-12.5 MG tablet Commonly known as: Zestoretic Take 1 tablet by mouth daily.   metoprolol succinate 50 MG 24 hr tablet Commonly known as: TOPROL-XL TAKE 1 TABLET BY MOUTH ONCE A DAY WITH OR IMMEDIATELY FOLLOWING A MEAL.   montelukast 10 MG tablet Commonly known as: SINGULAIR TAKE 1 TABLET BY MOUTH EVERY NIGHT AT BEDTIME   multivitamin tablet Take 1  tablet by mouth daily.   rosuvastatin 10 MG tablet Commonly known as: CRESTOR TAKE 1 TABLET BY MOUTH ONCE A DAY   Trelegy Ellipta 200-62.5-25 MCG/ACT Aepb Generic drug: Fluticasone-Umeclidin-Vilant 1 puff

## 2022-05-11 ENCOUNTER — Other Ambulatory Visit: Payer: Self-pay | Admitting: Family Medicine

## 2022-05-11 ENCOUNTER — Encounter: Payer: Self-pay | Admitting: Family Medicine

## 2022-05-11 ENCOUNTER — Ambulatory Visit (INDEPENDENT_AMBULATORY_CARE_PROVIDER_SITE_OTHER): Payer: BC Managed Care – PPO | Admitting: Family Medicine

## 2022-05-11 VITALS — BP 92/68 | HR 68 | Temp 97.9°F | Ht 72.5 in | Wt 193.1 lb

## 2022-05-11 DIAGNOSIS — Z0000 Encounter for general adult medical examination without abnormal findings: Secondary | ICD-10-CM

## 2022-05-11 NOTE — Patient Instructions (Signed)
Stop your metoprolol

## 2022-06-08 LAB — HM DIABETES EYE EXAM

## 2022-06-09 DIAGNOSIS — J4530 Mild persistent asthma, uncomplicated: Secondary | ICD-10-CM | POA: Diagnosis not present

## 2022-06-09 DIAGNOSIS — J3081 Allergic rhinitis due to animal (cat) (dog) hair and dander: Secondary | ICD-10-CM | POA: Diagnosis not present

## 2022-06-09 DIAGNOSIS — H1045 Other chronic allergic conjunctivitis: Secondary | ICD-10-CM | POA: Diagnosis not present

## 2022-06-09 DIAGNOSIS — J300 Vasomotor rhinitis: Secondary | ICD-10-CM | POA: Diagnosis not present

## 2022-06-29 ENCOUNTER — Encounter: Payer: Self-pay | Admitting: Internal Medicine

## 2022-06-29 ENCOUNTER — Ambulatory Visit: Payer: BC Managed Care – PPO | Admitting: Internal Medicine

## 2022-06-29 VITALS — BP 126/70 | HR 77 | Temp 98.1°F | Ht 72.0 in | Wt 193.8 lb

## 2022-06-29 DIAGNOSIS — J4520 Mild intermittent asthma, uncomplicated: Secondary | ICD-10-CM

## 2022-06-29 DIAGNOSIS — G4733 Obstructive sleep apnea (adult) (pediatric): Secondary | ICD-10-CM

## 2022-06-29 DIAGNOSIS — K219 Gastro-esophageal reflux disease without esophagitis: Secondary | ICD-10-CM

## 2022-06-29 NOTE — Patient Instructions (Signed)
Continue inhalers as prescribed Recommend referral to GI assessment for reflux

## 2022-06-29 NOTE — Progress Notes (Signed)
PROBLEMS/synopsis Moderate persistent asthma Mild OSA  - intolerant to CPAP   DATA: Sleep study 02/02/16: AHI 11.8/hr with frequent arousals and sleep fragmentation. Spirometry 06/14/13: FEV1 3.85 liters (85% pred), FEV1/FVC 79% Spirometry 02/26/16:  FEV1 3.81 liters (84% pred), FEV1/FVC 81% CPAP compliance 11/05-12/04/17: Set pressure 9 cm H2O. Usage 20/30 days. 17 days > 4 hrs.  CPAP compliance: Set pressure 9 cm H2O. Usage 20/30 days. 17 days > 4 hrs      INTERVAL HISTORY: Last visit 07/13/18. No major pulmonary events  CC Follow-up asthma Follow-up OSA  GERD    HPI No exacerbation at this time No evidence of heart failure at this time No evidence or signs of infection at this time No respiratory distress No fevers, chills, nausea, vomiting, diarrhea No evidence of lower extremity edema No evidence hemoptysis  Asthma seems to be well-controlled at this time uses albuterol infrequently Started on Trelegy inhaler therapy started by allergy immunology Dr. 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 Patient is exposed to more environmental exposures such as pollen grass and smoke Previously employed at [employer]  POST COVID INFECTION June 2022 Seems to be back to baseline state of health   BP 126/70 (BP Location: Left Arm, Cuff Size: Normal)   Pulse 77   Temp 98.1 F (36.7 C) (Temporal)   Ht 6' (1.829 m)   Wt 193 lb 12.8 oz (87.9 kg)   SpO2 99%   BMI 26.28 kg/m        Review of Systems: Gen:  Denies  fever, sweats, chills weight loss  HEENT: Denies blurred vision, double vision, ear  pain, eye pain, hearing loss, nose bleeds, sore throat Cardiac:  No dizziness, chest pain or heaviness, chest tightness,edema, No JVD Resp:   No cough, -sputum production, -shortness of breath,-wheezing, -hemoptysis,  Other:  All other systems negative    Physical Examination:   General Appearance: No distress  EYES PERRLA, EOM intact.   NECK Supple, No JVD Pulmonary: normal breath sounds, No wheezing.  CardiovascularNormal S1,S2.  No m/r/g.   Abdomen: Benign, Soft, non-tender. ALL OTHER ROS ARE NEGATIVE    Current Outpatient Medications on File Prior to Visit  Medication Sig Dispense Refill   albuterol (VENTOLIN HFA) 108 (90 Base) MCG/ACT inhaler Inhale 1-2 puffs into the lungs every 4 (four) hours as needed for wheezing or shortness of breath. 18 g 2   azelastine (OPTIVAR) 0.05 % ophthalmic solution 1 drop into affected eye     Azelastine HCl 137 MCG/SPRAY SOLN 1 puff in each nostril     esomeprazole (NEXIUM) 20 MG capsule Take 1 capsule (20 mg total) by mouth daily. 90 capsule 3   fluticasone (FLONASE) 50 MCG/ACT nasal spray Place into both nostrils.     Fluticasone-Umeclidin-Vilant (TRELEGY ELLIPTA) 200-62.5-25 MCG/ACT AEPB 1 puff     levocetirizine (XYZAL) 5 MG tablet Take 5 mg by mouth every evening.     lisinopril-hydrochlorothiazide (ZESTORETIC) 20-12.5 MG tablet TAKE 1 TABLET BY MOUTH ONCE A DAY 90 tablet 3   montelukast (SINGULAIR) 10 MG tablet TAKE 1 TABLET BY MOUTH EVERY NIGHT AT  BEDTIME 30 tablet 5   Multiple Vitamin (MULTIVITAMIN) tablet Take 1 tablet by mouth daily.     rosuvastatin (CRESTOR) 10 MG tablet TAKE 1 TABLET BY MOUTH ONCE A DAY 90 tablet 0   No current facility-administered medications on file prior to visit.     DATA:    Latest Ref Rng & Units 05/04/2022    4:01 PM 02/23/2021    8:17 AM 02/09/2021    9:46 AM  BMP  Glucose 70 - 99 mg/dL 779  390  300   BUN 6 - 23 mg/dL 16  13  14    Creatinine 0.40 - 1.50 mg/dL  9.23  3.00   BUN/Creat Ratio 9 -  20   13   Sodium 135 - 145 mEq/L 140  139  140   Potassium 3.5 - 5.1 mEq/L 3.7  3.3  3.7   Chloride 96 - 112 mEq/L 101  99  99   CO2 19 - 32 mEq/L 29  30  24    Calcium 8.4 - 10.5 mg/dL 9.9  9.1  9.8       Latest Ref Rng & Units 05/04/2022    4:01 PM 02/23/2021    8:17 AM 07/09/2019   10:01 AM  CBC  WBC 4.0 - 10.5 K/uL 7.3  10.6  6.4   Hemoglobin 13.0 - 17.0 g/dL 04/26/2021  07/11/2019  26.3   Hematocrit 39.0 - 52.0 % 42.4  40.0  43.0   Platelets 150.0 - 400.0 K/uL 247.0  252.0  247.0     CXR: NNF  Assessment and plan 46 year old pleasant white male with diagnosis of mild intermittent asthma which is well controlled at this time with avoidance of triggers and albuterol as needed along with underlying sleep apnea controlled by dental appliance   POST COVID  19 infection Seems to be back to baseline   Mild intermittent asthma Seems to be controlled at this time Continue Trelegy as prescribed Avoid triggers Rinse mouth after every use Continue Singulair Albuterol as needed   OSA Continues to wear dental device This seems to be helping his snoring  No exacerbation at this time No evidence of heart failure at this time No evidence or signs of infection at this time No respiratory distress No fevers, chills, nausea, vomiting, diarrhea No evidence of lower extremity edema No evidence hemoptysis   MEDICATION ADJUSTMENTS/LABS AND TESTS ORDERED: Continue dental device for sleep apnea Avoid allergens  Avoid sick contacts Continue inhalers as prescribed Recommend referral to GI assessment for reflux  Follow-up 1 year  Total time spent 21 minutes  Kahlil Cowans 61, M.D.  05-13-1991 Pulmonary & Critical Care Medicine  Medical Director [facility] Medical Director [facility] Cardio-Pulmonary Department

## 2022-06-29 NOTE — Addendum Note (Signed)
Addended by: Lajoyce Lauber A on: 06/29/2022 10:45 AM   Modules accepted: Orders

## 2022-06-30 ENCOUNTER — Other Ambulatory Visit: Payer: Self-pay | Admitting: Family Medicine

## 2022-07-11 ENCOUNTER — Ambulatory Visit: Payer: BC Managed Care – PPO | Admitting: Internal Medicine

## 2022-08-05 ENCOUNTER — Other Ambulatory Visit: Payer: Self-pay | Admitting: Family Medicine

## 2022-08-12 ENCOUNTER — Telehealth: Payer: BC Managed Care – PPO | Admitting: Family Medicine

## 2022-08-12 ENCOUNTER — Encounter: Payer: Self-pay | Admitting: Family Medicine

## 2022-08-12 ENCOUNTER — Other Ambulatory Visit: Payer: Self-pay | Admitting: Nurse Practitioner

## 2022-08-12 DIAGNOSIS — R0602 Shortness of breath: Secondary | ICD-10-CM

## 2022-08-12 DIAGNOSIS — R112 Nausea with vomiting, unspecified: Secondary | ICD-10-CM | POA: Diagnosis not present

## 2022-08-12 DIAGNOSIS — R059 Cough, unspecified: Secondary | ICD-10-CM

## 2022-08-12 DIAGNOSIS — K219 Gastro-esophageal reflux disease without esophagitis: Secondary | ICD-10-CM | POA: Diagnosis not present

## 2022-08-12 DIAGNOSIS — J9801 Acute bronchospasm: Secondary | ICD-10-CM

## 2022-08-12 MED ORDER — ONDANSETRON HCL 4 MG PO TABS: 4.0000 mg | ORAL_TABLET | Freq: Three times a day (TID) | ORAL | 0 refills | Status: DC | PRN

## 2022-08-12 NOTE — Assessment & Plan Note (Signed)
Taking nexium ? If role in nausea  Per pt has GI f/u planned Fairly nl EGD in 2011

## 2022-08-12 NOTE — Progress Notes (Signed)
Virtual Visit via Video Note  I connected with Antonio Lara on 08/12/22 at 10:00 AM EST by a video enabled telemedicine application and verified that I am speaking with the correct person using two identifiers.  Location: Patient: home Provider: office    I discussed the limitations of evaluation and management by telemedicine and the availability of in person appointments. The patient expressed understanding and agreed to proceed.  Parties involved in encounter  Patient: Antonio Lara  Provider:  Roxy Manns MD   History of Present Illness: 46 yo pt of Dr Patsy Lager presents with n/v/d and fatigue   A lot of stomach bubbling for 2 wk with nausea  Started vomiting Wednesday Then some diarrhea last night  Lethargy and exhaustion  Abdomen hurt several nights ago - was tender and sensitive-that is a lot better  (Worse in upper abd but all over)   No fever No headache   Trouble concentrating lately   Last covid was several years ago   She did take care of an elderly relative with a bug Some flu and covid at work   A little cough in the past few days  Usually clear mucous  Asthma is a little worse than usual   Otc: [employer]  Working on fluid intake - does not feel very dehydrated   Prediabetes     Covid test is negative   Takes nexium for 3-4 y for GERD Something else before then   Has a GI visit upcoming     Lab Results  Component Value Date   WBC 7.3 05/04/2022   HGB 14.5 05/04/2022   HCT 42.4 05/04/2022   MCV 84.6 05/04/2022   PLT 247.0 05/04/2022   No h/o GI bleeding that he knows   Had fairly nl egd in 2011  Patient Active Problem List   Diagnosis Date Noted   Nausea & vomiting 08/12/2022   Allergic rhinitis due to animal (cat) (dog) hair and dander 05/06/2022   Chronic allergic conjunctivitis 05/06/2022   Mild persistent asthma, uncomplicated 05/06/2022   Vasomotor rhinitis 05/06/2022   Hyperlipidemia LDL goal <70  03/05/2021   Diabetes mellitus type 2, diet-controlled (HCC) 03/04/2021   Hypertension, essential 10/19/2020   Moderate persistent asthma with exacerbation 03/21/2019   GERD 07/10/2009   Allergic rhinitis 04/08/2008   Past Medical History:  Diagnosis Date   Diabetes mellitus type 2, diet-controlled (HCC) 03/04/2021   GERD (gastroesophageal reflux disease)    Hyperlipidemia LDL goal <70 03/05/2021   Mild obstructive sleep apnea, controlled with dental appliance    Moderate persistent asthma    Sleep apnea    History reviewed. No pertinent surgical history. Social History   Tobacco Use   Smoking status: Never   Smokeless tobacco: Never  Vaping Use   Vaping Use: Never used  Substance Use Topics   Alcohol use: Yes    Comment: rarely   Drug use: No   Family History  Problem Relation Age of Onset   Breast cancer Mother    Stroke Father    Cancer Maternal Grandfather    Allergies  Allergen Reactions   Oxycodone-Acetaminophen     REACTION: extemely dizzy with nausea and vomiting.   Zanaflex [Tizanidine Hcl] Other (See Comments)    Felt unusual and had some vivid visuals   Current Outpatient Medications on File Prior to Visit  Medication Sig Dispense Refill   albuterol (VENTOLIN HFA) 108 (90 Base) MCG/ACT inhaler Inhale 1-2 puffs into the lungs every  4 (four) hours as needed for wheezing or shortness of breath. 18 g 2   azelastine (OPTIVAR) 0.05 % ophthalmic solution 1 drop into affected eye     Azelastine HCl 137 MCG/SPRAY SOLN 1 puff in each nostril     esomeprazole (NEXIUM) 20 MG capsule TAKE 1 CAPSULE BY MOUTH DAILY 90 capsule 3   fluticasone (FLONASE) 50 MCG/ACT nasal spray Place into both nostrils.     Fluticasone-Umeclidin-Vilant (TRELEGY ELLIPTA) 200-62.5-25 MCG/ACT AEPB 1 puff     levocetirizine (XYZAL) 5 MG tablet Take 5 mg by mouth every evening.     lisinopril-hydrochlorothiazide (ZESTORETIC) 20-12.5 MG tablet TAKE 1 TABLET BY MOUTH ONCE A DAY 90 tablet 3    montelukast (SINGULAIR) 10 MG tablet TAKE 1 TABLET BY MOUTH EVERY NIGHT AT BEDTIME 30 tablet 5   Multiple Vitamin (MULTIVITAMIN) tablet Take 1 tablet by mouth daily.     rosuvastatin (CRESTOR) 10 MG tablet TAKE 1 TABLET BY MOUTH ONCE A DAY 90 tablet 3   No current facility-administered medications on file prior to visit.   Review of Systems  Constitutional:  Positive for malaise/fatigue. Negative for chills and fever.  HENT:  Positive for congestion. Negative for ear pain, sinus pain and sore throat.        Sneezing  Eyes:  Negative for blurred vision, discharge and redness.  Respiratory:  Positive for cough. Negative for shortness of breath and stridor.        Mild cough  Cardiovascular:  Negative for chest pain, palpitations and leg swelling.  Gastrointestinal:  Positive for diarrhea, heartburn, nausea and vomiting. Negative for abdominal pain, blood in stool, constipation and melena.       No longer has abd pain or tenderness  Musculoskeletal:  Negative for myalgias.  Skin:  Negative for rash.  Neurological:  Negative for dizziness and headaches.    Observations/Objective:  Patient appears well, in no distress Weight is baseline  No facial swelling or asymmetry Normal voice-not hoarse and no slurred speech No obvious tremor or mobility impairment Moving neck and UEs normally Able to hear the call well  No cough or shortness of breath during interview  No tenderness when pt moves or pushes on his abdomen Talkative and mentally sharp with no cognitive changes No skin changes on face or neck , no rash or pallor Affect is normal   Assessment and Plan:  Problem List Items Addressed This Visit       Digestive   GERD    Taking nexium ? If role in nausea  Per pt has GI f/u planned Fairly nl EGD in 2011         Relevant Medications   ondansetron (ZOFRAN) 4 MG tablet   Nausea & vomiting - Primary    N/v/d for several days in pt who struggles with GERD and on ppi long term   Also some milder nausea/gurgling for 2 weeks (he is planning to see GI) Poss viral syndrome (also some very mild uri symptoms but no fever) Doubt flu (no fever), unlikely covid since test was neg  Limited eval today since virtual  Disc symptom care  Zoftan sent (nausea is already improving) Disc fluid intake and gradual diet adv with caution  S/s of dehydration reviewed Inst to call and seek care if worse symptoms, abd pain , or sympt of dehydration  Update if not starting to improve in a week or if worsening           Follow Up Instructions:  Sip fluids and rest Watch for signs of dehydration including headache, rapid heart rate and dry mouth If any severe symptoms go to the ER  Try zofran for nausea as needed, caution of sedation   Mucinex dm for cough/congestion if needed   Gradually advance diet to bland Bananas, rice, apple sauce, toast or crackers are good to start   Update if not starting to improve in a week or if worsening    I discussed the assessment and treatment plan with the patient. The patient was provided an opportunity to ask questions and all were answered. The patient agreed with the plan and demonstrated an understanding of the instructions.   The patient was advised to call back or seek an in-person evaluation if the symptoms worsen or if the condition fails to improve as anticipated.     Roxy Manns, MD

## 2022-08-12 NOTE — Assessment & Plan Note (Signed)
N/v/d for several days in pt who struggles with GERD and on ppi long term  Also some milder nausea/gurgling for 2 weeks (he is planning to see GI) Poss viral syndrome (also some very mild uri symptoms but no fever) Doubt flu (no fever), unlikely covid since test was neg  Limited eval today since virtual  Disc symptom care  Zoftan sent (nausea is already improving) Disc fluid intake and gradual diet adv with caution  S/s of dehydration reviewed Inst to call and seek care if worse symptoms, abd pain , or sympt of dehydration  Update if not starting to improve in a week or if worsening

## 2022-08-12 NOTE — Patient Instructions (Signed)
Sip fluids and rest Watch for signs of dehydration including headache, rapid heart rate and dry mouth If any severe symptoms go to the ER  Try zofran for nausea as needed, caution of sedation   Mucinex dm for cough/congestion if needed   Gradually advance diet to bland Bananas, rice, apple sauce, toast or crackers are good to start   Update if not starting to improve in a week or if worsening

## 2022-10-03 ENCOUNTER — Other Ambulatory Visit: Payer: Self-pay | Admitting: Internal Medicine

## 2022-10-04 DIAGNOSIS — M9902 Segmental and somatic dysfunction of thoracic region: Secondary | ICD-10-CM | POA: Diagnosis not present

## 2022-10-04 DIAGNOSIS — M9903 Segmental and somatic dysfunction of lumbar region: Secondary | ICD-10-CM | POA: Diagnosis not present

## 2022-10-04 DIAGNOSIS — M9901 Segmental and somatic dysfunction of cervical region: Secondary | ICD-10-CM | POA: Diagnosis not present

## 2022-10-04 DIAGNOSIS — M9907 Segmental and somatic dysfunction of upper extremity: Secondary | ICD-10-CM | POA: Diagnosis not present

## 2022-10-05 DIAGNOSIS — M9903 Segmental and somatic dysfunction of lumbar region: Secondary | ICD-10-CM | POA: Diagnosis not present

## 2022-10-05 DIAGNOSIS — M9907 Segmental and somatic dysfunction of upper extremity: Secondary | ICD-10-CM | POA: Diagnosis not present

## 2022-10-05 DIAGNOSIS — M9901 Segmental and somatic dysfunction of cervical region: Secondary | ICD-10-CM | POA: Diagnosis not present

## 2022-10-05 DIAGNOSIS — M9902 Segmental and somatic dysfunction of thoracic region: Secondary | ICD-10-CM | POA: Diagnosis not present

## 2022-10-06 DIAGNOSIS — M9902 Segmental and somatic dysfunction of thoracic region: Secondary | ICD-10-CM | POA: Diagnosis not present

## 2022-10-06 DIAGNOSIS — M9907 Segmental and somatic dysfunction of upper extremity: Secondary | ICD-10-CM | POA: Diagnosis not present

## 2022-10-06 DIAGNOSIS — M9901 Segmental and somatic dysfunction of cervical region: Secondary | ICD-10-CM | POA: Diagnosis not present

## 2022-10-06 DIAGNOSIS — M9903 Segmental and somatic dysfunction of lumbar region: Secondary | ICD-10-CM | POA: Diagnosis not present

## 2022-10-07 DIAGNOSIS — M9903 Segmental and somatic dysfunction of lumbar region: Secondary | ICD-10-CM | POA: Diagnosis not present

## 2022-10-07 DIAGNOSIS — M9902 Segmental and somatic dysfunction of thoracic region: Secondary | ICD-10-CM | POA: Diagnosis not present

## 2022-10-07 DIAGNOSIS — M9901 Segmental and somatic dysfunction of cervical region: Secondary | ICD-10-CM | POA: Diagnosis not present

## 2022-10-07 DIAGNOSIS — M9907 Segmental and somatic dysfunction of upper extremity: Secondary | ICD-10-CM | POA: Diagnosis not present

## 2022-10-10 DIAGNOSIS — M9902 Segmental and somatic dysfunction of thoracic region: Secondary | ICD-10-CM | POA: Diagnosis not present

## 2022-10-10 DIAGNOSIS — M9907 Segmental and somatic dysfunction of upper extremity: Secondary | ICD-10-CM | POA: Diagnosis not present

## 2022-10-10 DIAGNOSIS — M9901 Segmental and somatic dysfunction of cervical region: Secondary | ICD-10-CM | POA: Diagnosis not present

## 2022-10-10 DIAGNOSIS — M9903 Segmental and somatic dysfunction of lumbar region: Secondary | ICD-10-CM | POA: Diagnosis not present

## 2022-10-12 DIAGNOSIS — M9901 Segmental and somatic dysfunction of cervical region: Secondary | ICD-10-CM | POA: Diagnosis not present

## 2022-10-12 DIAGNOSIS — M9903 Segmental and somatic dysfunction of lumbar region: Secondary | ICD-10-CM | POA: Diagnosis not present

## 2022-10-12 DIAGNOSIS — M9902 Segmental and somatic dysfunction of thoracic region: Secondary | ICD-10-CM | POA: Diagnosis not present

## 2022-10-12 DIAGNOSIS — M9907 Segmental and somatic dysfunction of upper extremity: Secondary | ICD-10-CM | POA: Diagnosis not present

## 2022-10-12 NOTE — Progress Notes (Unsigned)
    Missie Gehrig T. Marliyah Reid, MD, [place] at [facility] [place], [address]  Phone: [phone]  FAX: [place] - 47 y.o. male  MRN [id number]  Date of Birth: 03-03-1976  Date: 10/13/2022  PCP: Owens Loffler, MD  Referral: Owens Loffler, MD  No chief complaint on file.  Subjective:   Antonio Lara is a 47 y.o. very pleasant male patient with There is no height or weight on file to calculate BMI. who presents with the following:  Pleasant, well-known patient who presents with neck and back pain.    Review of Systems is noted in the HPI, as appropriate  Objective:   There were no vitals taken for this visit.  GEN: No acute distress; alert,appropriate. PULM: Breathing comfortably in no respiratory distress PSYCH: Normally interactive.   Laboratory and Imaging Data:  Assessment and Plan:   ***

## 2022-10-13 ENCOUNTER — Ambulatory Visit: Payer: BC Managed Care – PPO | Admitting: Family Medicine

## 2022-10-13 ENCOUNTER — Ambulatory Visit (INDEPENDENT_AMBULATORY_CARE_PROVIDER_SITE_OTHER): Payer: Self-pay | Admitting: Adult Health

## 2022-10-13 ENCOUNTER — Encounter: Payer: Self-pay | Admitting: Family Medicine

## 2022-10-13 VITALS — BP 100/76 | HR 76 | Temp 97.3°F | Ht 72.5 in | Wt 194.1 lb

## 2022-10-13 DIAGNOSIS — G8929 Other chronic pain: Secondary | ICD-10-CM

## 2022-10-13 DIAGNOSIS — M546 Pain in thoracic spine: Secondary | ICD-10-CM

## 2022-10-13 DIAGNOSIS — M5442 Lumbago with sciatica, left side: Secondary | ICD-10-CM

## 2022-10-13 DIAGNOSIS — M542 Cervicalgia: Secondary | ICD-10-CM

## 2022-10-13 DIAGNOSIS — M5441 Lumbago with sciatica, right side: Secondary | ICD-10-CM

## 2022-10-13 DIAGNOSIS — M545 Low back pain, unspecified: Secondary | ICD-10-CM

## 2022-10-13 MED ORDER — CYCLOBENZAPRINE HCL 10 MG PO TABS: 10.0000 mg | ORAL_TABLET | Freq: Every evening | ORAL | 1 refills | Status: DC | PRN

## 2022-10-13 MED ORDER — PREDNISONE 20 MG PO TABS: ORAL_TABLET | ORAL | 0 refills | Status: DC

## 2022-10-13 NOTE — Progress Notes (Signed)
[facility] [address], [place] [address]   Office Visit Note  Patient Name: Antonio Lara Date of Birth [id number]  Medical Record number [id number]  Date of Service: 10/13/2022  No chief complaint on file.    HPI Pt is here for a sick visit. Patient here reporting that he has been having back pain, upper and lower.  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 He saw his PCP today and then gave him prednisone and muscle relaxer's. He is interested in PT, through [facility].    Current Medication:  Outpatient Encounter Medications as of 10/13/2022  Medication Sig   azelastine (OPTIVAR) 0.05 % ophthalmic solution 1 drop into affected eye   Azelastine HCl 137 MCG/SPRAY SOLN 1 puff in each nostril   cyclobenzaprine (FLEXERIL) 10 MG tablet Take 1 tablet (10 mg total) by mouth at bedtime as needed for muscle spasms.   esomeprazole (NEXIUM) 20 MG capsule TAKE 1 CAPSULE BY MOUTH DAILY   fluticasone (FLONASE) 50 MCG/ACT nasal spray Place into both nostrils.   Fluticasone-Umeclidin-Vilant (TRELEGY ELLIPTA) 200-62.5-25 MCG/ACT AEPB 1 puff   levocetirizine (XYZAL) 5 MG tablet Take 5 mg by mouth every evening.   lisinopril-hydrochlorothiazide (ZESTORETIC) 20-12.5 MG tablet TAKE 1 TABLET BY MOUTH ONCE A DAY   montelukast (SINGULAIR) 10 MG tablet TAKE 1 TABLET BY MOUTH EVERY NIGHT AT BEDTIME   Multiple Vitamin (MULTIVITAMIN) tablet Take 1 tablet by mouth daily.   predniSONE (DELTASONE) 20 MG tablet 2 tabs po daily for 5 days, then 1 tab po daily for 5 days   rosuvastatin (CRESTOR) 10 MG tablet TAKE 1 TABLET BY MOUTH ONCE A DAY   VENTOLIN HFA 108 (90 Base) MCG/ACT inhaler INHALE 2 PUFFS INTO THE LUNGS EVERY 6 HOURS AS NEEDED FOR WHEEZING OR SHORTNESS OF BREATH   No facility-administered encounter medications on file as of 10/13/2022.      Medical History: Past Medical History:  Diagnosis Date   Diabetes mellitus type 2, diet-controlled ([place]) 03/04/2021   GERD  (gastroesophageal reflux disease)    Hyperlipidemia LDL goal <70 03/05/2021   Mild obstructive sleep apnea, controlled with dental appliance    Moderate persistent asthma    Sleep apnea      Vital Signs: There were no vitals taken for this visit.   Review of Systems  Constitutional:  Negative for chills, fatigue and fever.  Respiratory:  Negative for cough.   Cardiovascular:  Negative for chest pain.  Gastrointestinal:  Negative for diarrhea, nausea and vomiting.  Musculoskeletal:  Positive for back pain.  Skin:  Negative for rash.  Neurological:  Negative for weakness and numbness.    Physical Exam Constitutional:      Appearance: Normal appearance.  Musculoskeletal:        General: No swelling.     Right lower leg: No edema.     Left lower leg: No edema.     Comments: Has spinal tenderness from approimately C5-L5  Neurological:     Mental Status: He is alert.    Assessment/Plan: 1. Chronic midline low back pain, unspecified whether sciatica present Agree with PCP to have PT done, may continue to see chiropractor as needed.  If he needs MRI or CT, did discuss discount available for [employer] employees through [employer].   2. Chronic midline thoracic back pain   See PCP for PT orders as discussed.   General Counseling: Esdras verbalizes understanding of the findings of todays visit and agrees with plan of  treatment. I have discussed any further diagnostic evaluation that may be needed or ordered today. We also reviewed his medications today. he has been encouraged to call the office with any questions or concerns that should arise related to todays visit.   No orders of the defined types were placed in this encounter.   No orders of the defined types were placed in this encounter.   Time spent:15 Minutes    Kendell Bane AGNP-C Nurse Practitioner

## 2022-10-13 NOTE — Addendum Note (Signed)
Addended by: Owens Loffler on: 10/13/2022 05:26 PM   Modules accepted: Orders

## 2022-10-14 DIAGNOSIS — M9903 Segmental and somatic dysfunction of lumbar region: Secondary | ICD-10-CM | POA: Diagnosis not present

## 2022-10-14 DIAGNOSIS — M9907 Segmental and somatic dysfunction of upper extremity: Secondary | ICD-10-CM | POA: Diagnosis not present

## 2022-10-14 DIAGNOSIS — M9902 Segmental and somatic dysfunction of thoracic region: Secondary | ICD-10-CM | POA: Diagnosis not present

## 2022-10-14 DIAGNOSIS — M9901 Segmental and somatic dysfunction of cervical region: Secondary | ICD-10-CM | POA: Diagnosis not present

## 2022-10-18 DIAGNOSIS — M9901 Segmental and somatic dysfunction of cervical region: Secondary | ICD-10-CM | POA: Diagnosis not present

## 2022-10-18 DIAGNOSIS — M9907 Segmental and somatic dysfunction of upper extremity: Secondary | ICD-10-CM | POA: Diagnosis not present

## 2022-10-18 DIAGNOSIS — M9902 Segmental and somatic dysfunction of thoracic region: Secondary | ICD-10-CM | POA: Diagnosis not present

## 2022-10-18 DIAGNOSIS — M9903 Segmental and somatic dysfunction of lumbar region: Secondary | ICD-10-CM | POA: Diagnosis not present

## 2022-10-20 DIAGNOSIS — M9901 Segmental and somatic dysfunction of cervical region: Secondary | ICD-10-CM | POA: Diagnosis not present

## 2022-10-20 DIAGNOSIS — M9902 Segmental and somatic dysfunction of thoracic region: Secondary | ICD-10-CM | POA: Diagnosis not present

## 2022-10-20 DIAGNOSIS — M9903 Segmental and somatic dysfunction of lumbar region: Secondary | ICD-10-CM | POA: Diagnosis not present

## 2022-10-20 DIAGNOSIS — M9907 Segmental and somatic dysfunction of upper extremity: Secondary | ICD-10-CM | POA: Diagnosis not present

## 2022-10-21 DIAGNOSIS — M9903 Segmental and somatic dysfunction of lumbar region: Secondary | ICD-10-CM | POA: Diagnosis not present

## 2022-10-21 DIAGNOSIS — M9901 Segmental and somatic dysfunction of cervical region: Secondary | ICD-10-CM | POA: Diagnosis not present

## 2022-10-21 DIAGNOSIS — M9902 Segmental and somatic dysfunction of thoracic region: Secondary | ICD-10-CM | POA: Diagnosis not present

## 2022-10-21 DIAGNOSIS — M9907 Segmental and somatic dysfunction of upper extremity: Secondary | ICD-10-CM | POA: Diagnosis not present

## 2022-10-26 DIAGNOSIS — M9902 Segmental and somatic dysfunction of thoracic region: Secondary | ICD-10-CM | POA: Diagnosis not present

## 2022-10-26 DIAGNOSIS — M9903 Segmental and somatic dysfunction of lumbar region: Secondary | ICD-10-CM | POA: Diagnosis not present

## 2022-10-26 DIAGNOSIS — M9901 Segmental and somatic dysfunction of cervical region: Secondary | ICD-10-CM | POA: Diagnosis not present

## 2022-10-26 DIAGNOSIS — M9907 Segmental and somatic dysfunction of upper extremity: Secondary | ICD-10-CM | POA: Diagnosis not present

## 2022-10-27 DIAGNOSIS — M9901 Segmental and somatic dysfunction of cervical region: Secondary | ICD-10-CM | POA: Diagnosis not present

## 2022-10-27 DIAGNOSIS — M9902 Segmental and somatic dysfunction of thoracic region: Secondary | ICD-10-CM | POA: Diagnosis not present

## 2022-10-27 DIAGNOSIS — M9903 Segmental and somatic dysfunction of lumbar region: Secondary | ICD-10-CM | POA: Diagnosis not present

## 2022-10-27 DIAGNOSIS — M9907 Segmental and somatic dysfunction of upper extremity: Secondary | ICD-10-CM | POA: Diagnosis not present

## 2022-11-01 DIAGNOSIS — M9901 Segmental and somatic dysfunction of cervical region: Secondary | ICD-10-CM | POA: Diagnosis not present

## 2022-11-01 DIAGNOSIS — M9903 Segmental and somatic dysfunction of lumbar region: Secondary | ICD-10-CM | POA: Diagnosis not present

## 2022-11-01 DIAGNOSIS — M9902 Segmental and somatic dysfunction of thoracic region: Secondary | ICD-10-CM | POA: Diagnosis not present

## 2022-11-01 DIAGNOSIS — M9907 Segmental and somatic dysfunction of upper extremity: Secondary | ICD-10-CM | POA: Diagnosis not present

## 2022-11-04 DIAGNOSIS — M9901 Segmental and somatic dysfunction of cervical region: Secondary | ICD-10-CM | POA: Diagnosis not present

## 2022-11-04 DIAGNOSIS — M9907 Segmental and somatic dysfunction of upper extremity: Secondary | ICD-10-CM | POA: Diagnosis not present

## 2022-11-04 DIAGNOSIS — M9903 Segmental and somatic dysfunction of lumbar region: Secondary | ICD-10-CM | POA: Diagnosis not present

## 2022-11-04 DIAGNOSIS — M9902 Segmental and somatic dysfunction of thoracic region: Secondary | ICD-10-CM | POA: Diagnosis not present

## 2022-11-09 DIAGNOSIS — M9901 Segmental and somatic dysfunction of cervical region: Secondary | ICD-10-CM | POA: Diagnosis not present

## 2022-11-09 DIAGNOSIS — M9902 Segmental and somatic dysfunction of thoracic region: Secondary | ICD-10-CM | POA: Diagnosis not present

## 2022-11-09 DIAGNOSIS — M9907 Segmental and somatic dysfunction of upper extremity: Secondary | ICD-10-CM | POA: Diagnosis not present

## 2022-11-09 DIAGNOSIS — M9903 Segmental and somatic dysfunction of lumbar region: Secondary | ICD-10-CM | POA: Diagnosis not present

## 2022-11-10 DIAGNOSIS — M9901 Segmental and somatic dysfunction of cervical region: Secondary | ICD-10-CM | POA: Diagnosis not present

## 2022-11-10 DIAGNOSIS — M9902 Segmental and somatic dysfunction of thoracic region: Secondary | ICD-10-CM | POA: Diagnosis not present

## 2022-11-10 DIAGNOSIS — M9907 Segmental and somatic dysfunction of upper extremity: Secondary | ICD-10-CM | POA: Diagnosis not present

## 2022-11-10 DIAGNOSIS — M9903 Segmental and somatic dysfunction of lumbar region: Secondary | ICD-10-CM | POA: Diagnosis not present

## 2022-11-15 ENCOUNTER — Ambulatory Visit: Payer: BC Managed Care – PPO | Admitting: Gastroenterology

## 2022-11-15 ENCOUNTER — Encounter: Payer: Self-pay | Admitting: Gastroenterology

## 2022-11-15 VITALS — BP 128/89 | HR 79 | Temp 98.4°F | Ht 72.5 in | Wt 195.1 lb

## 2022-11-15 DIAGNOSIS — K219 Gastro-esophageal reflux disease without esophagitis: Secondary | ICD-10-CM | POA: Diagnosis not present

## 2022-11-15 DIAGNOSIS — M9907 Segmental and somatic dysfunction of upper extremity: Secondary | ICD-10-CM | POA: Diagnosis not present

## 2022-11-15 DIAGNOSIS — M9902 Segmental and somatic dysfunction of thoracic region: Secondary | ICD-10-CM | POA: Diagnosis not present

## 2022-11-15 DIAGNOSIS — M9901 Segmental and somatic dysfunction of cervical region: Secondary | ICD-10-CM | POA: Diagnosis not present

## 2022-11-15 DIAGNOSIS — M9903 Segmental and somatic dysfunction of lumbar region: Secondary | ICD-10-CM | POA: Diagnosis not present

## 2022-11-15 NOTE — Addendum Note (Signed)
Addended by: Jacqualin Combes on: 11/15/2022 01:12 PM   Modules accepted: Orders

## 2022-11-15 NOTE — Patient Instructions (Addendum)
"  [place]"

## 2022-11-15 NOTE — Progress Notes (Signed)
Jonathon Bellows MD, MRCP(U.K) [address]  [place]  [place], [place] [address]  Main: [phone]  Fax: [phone]   Gastroenterology Consultation  Referring Provider:     Owens Loffler, MD Primary Care Physician:  Owens Loffler, MD Primary Gastroenterologist:  Dr. Jonathon Bellows  Reason for Consultation:     GERD        HPI:   Antonio Lara is a 47 y.o. y/o male referred for consultation & management  by Dr. Owens Loffler, MD.    He states he has had acid reflux for the past 10 to 15 years.  He describes his symptoms as regurgitation when he lays flat particularly in the mornings initially was taking Protonix and symptoms well-controlled after many years response went down and started taking Nexium for the same.  Presently he has Nexium takes it first thing in the morning on an empty stomach.  Denies any dysphagia.  Not on any weight loss medications.  He is diabetic.  No other complaints. No recent labs or imaging.   Past Medical History:  Diagnosis Date   Diabetes mellitus type 2, diet-controlled ([facility]) 03/04/2021   GERD (gastroesophageal reflux disease)    Hyperlipidemia LDL goal <70 03/05/2021   Mild obstructive sleep apnea, controlled with dental appliance    Moderate persistent asthma    Sleep apnea     No past surgical history on file.  Prior to Admission medications   Medication Sig Start Date End Date Taking? Authorizing Provider  azelastine (OPTIVAR) 0.05 % ophthalmic solution 1 drop into affected eye 02/15/22   [provider]  Azelastine HCl 137 MCG/SPRAY SOLN 1 puff in each nostril 02/15/22   [provider]  cyclobenzaprine (FLEXERIL) 10 MG tablet Take 1 tablet (10 mg total) by mouth at bedtime as needed for muscle spasms. 10/13/22   Copland, Frederico Hamman, MD  esomeprazole (NEXIUM) 20 MG capsule TAKE 1 CAPSULE BY MOUTH DAILY 08/08/22   Copland, Frederico Hamman, MD  fluticasone (FLONASE) 50 MCG/ACT nasal spray Place into both nostrils. 02/15/22    [provider]  Fluticasone-Umeclidin-Vilant Viviana Simpler ELLIPTA) 200-62.5-25 MCG/ACT AEPB 1 puff 02/15/22   [provider]  levocetirizine (XYZAL) 5 MG tablet Take 5 mg by mouth every evening.    [provider]  lisinopril-hydrochlorothiazide (ZESTORETIC) 20-12.5 MG tablet TAKE 1 TABLET BY MOUTH ONCE A DAY 05/11/22   Copland, Frederico Hamman, MD  montelukast (SINGULAIR) 10 MG tablet TAKE 1 TABLET BY MOUTH EVERY NIGHT AT BEDTIME 10/03/22   Flora Lipps, MD  Multiple Vitamin (MULTIVITAMIN) tablet Take 1 tablet by mouth daily.    [provider]  predniSONE (DELTASONE) 20 MG tablet 2 tabs po daily for 5 days, then 1 tab po daily for 5 days 10/13/22   Copland, Frederico Hamman, MD  rosuvastatin (CRESTOR) 10 MG tablet TAKE 1 TABLET BY MOUTH ONCE A DAY 06/30/22   Copland, Spencer, MD  VENTOLIN HFA 108 (90 Base) MCG/ACT inhaler INHALE 2 PUFFS INTO THE LUNGS EVERY 6 HOURS AS NEEDED FOR WHEEZING OR SHORTNESS OF BREATH 08/12/22   Copland, Frederico Hamman, MD    Family History  Problem Relation Age of Onset   Breast cancer Mother    Stroke Father    Cancer Maternal Grandfather      Social History   Tobacco Use   Smoking status: Never   Smokeless tobacco: Never  Vaping Use   Vaping Use: Never used  Substance Use Topics   Alcohol use: Yes    Comment: rarely  Drug use: No    Allergies as of 11/15/2022 - Review Complete 11/15/2022  Allergen Reaction Noted   Oxycodone-acetaminophen  01/02/2009   Zanaflex [tizanidine hcl] Other (See Comments) 08/11/2021    Review of Systems:    All systems reviewed and negative except where noted in HPI.   Physical Exam:  BP 128/89   Pulse 79   Temp 98.4 F (36.9 C) (Oral)   Ht 6' 0.5" (1.842 m)   Wt 195 lb 2 oz (88.5 kg)   BMI 26.10 kg/m  No LMP for male patient. Psych:  Alert and cooperative. Normal mood and affect. General:   Alert,  Well-developed, well-nourished, pleasant and cooperative in NAD Head:  Normocephalic and  atraumatic. Eyes:  Sclera clear, no icterus.   Conjunctiva pink.    Neurologic:  Alert and oriented x3;  grossly normal neurologically. Psych:  Alert and cooperative. Normal mood and affect.  Imaging Studies: No results found.  Assessment and Plan:   SNAPPER DECELLES is a 47 y.o. y/o male has been referred for GERD.  Very long history of acid reflux.  Discussed that he has been on a PPI for many years may be worth screening for Barrett's esophagus and discussing long-term options including antireflux surgery as he is very young and there are risks of taking long-term PPIs.  We will proceed with an upper endoscopy on Friday to evaluate for hiatal hernia as well as rule out Barrett's esophagus, for now he will continue to take his Nexium once a day I will give him information about acid reflux and suggest to use a wedge pillow picture which has been provided.  We have discussed the long-term side effects of PPIs including but not limited to low magnesium, thinning of the bones, increased risk of infection, possible kidney failure and hence will plan to discuss about surgical correction of a possible hiatal hernia in the future   I have discussed alternative options, risks & benefits,  which include, but are not limited to, bleeding, infection, perforation,respiratory complication & drug reaction.  The patient agrees with this plan & written consent will be obtained.     Follow up in 8 to 12 weeks  Dr Jonathon Bellows MD,MRCP(U.K)

## 2022-11-17 ENCOUNTER — Encounter: Payer: Self-pay | Admitting: Gastroenterology

## 2022-11-18 ENCOUNTER — Encounter
Admission: RE | Disposition: A | Discharge status: Home / Self Care | Payer: Self-pay | Source: Home / Self Care | Attending: Gastroenterology

## 2022-11-18 ENCOUNTER — Ambulatory Visit
Admission: RE | Admit: 2022-11-18 | Discharge: 2022-11-18 | Disposition: A | Discharge status: Home / Self Care | Payer: BC Managed Care – PPO | Attending: Gastroenterology | Admitting: Gastroenterology

## 2022-11-18 ENCOUNTER — Ambulatory Visit: Payer: BC Managed Care – PPO | Admitting: Anesthesiology

## 2022-11-18 ENCOUNTER — Encounter: Payer: Self-pay | Admitting: Gastroenterology

## 2022-11-18 DIAGNOSIS — Z09 Encounter for follow-up examination after completed treatment for conditions other than malignant neoplasm: Secondary | ICD-10-CM | POA: Diagnosis not present

## 2022-11-18 DIAGNOSIS — J449 Chronic obstructive pulmonary disease, unspecified: Secondary | ICD-10-CM | POA: Diagnosis not present

## 2022-11-18 DIAGNOSIS — G4733 Obstructive sleep apnea (adult) (pediatric): Secondary | ICD-10-CM | POA: Diagnosis not present

## 2022-11-18 DIAGNOSIS — I10 Essential (primary) hypertension: Secondary | ICD-10-CM | POA: Diagnosis not present

## 2022-11-18 DIAGNOSIS — E119 Type 2 diabetes mellitus without complications: Secondary | ICD-10-CM | POA: Diagnosis not present

## 2022-11-18 DIAGNOSIS — E785 Hyperlipidemia, unspecified: Secondary | ICD-10-CM | POA: Insufficient documentation

## 2022-11-18 DIAGNOSIS — K219 Gastro-esophageal reflux disease without esophagitis: Secondary | ICD-10-CM | POA: Diagnosis not present

## 2022-11-18 HISTORY — PX: ESOPHAGOGASTRODUODENOSCOPY (EGD) WITH PROPOFOL: SHX5813

## 2022-11-18 LAB — GLUCOSE, CAPILLARY: Glucose-Capillary: 133 mg/dL — ABNORMAL HIGH (ref 70–99)

## 2022-11-18 SURGERY — ESOPHAGOGASTRODUODENOSCOPY (EGD) WITH PROPOFOL
Anesthesia: General

## 2022-11-18 MED ORDER — LIDOCAINE HCL (CARDIAC) PF 100 MG/5ML IV SOSY
PREFILLED_SYRINGE | INTRAVENOUS | Status: DC | PRN
  Administered 2022-11-18: 100 mg via INTRAVENOUS

## 2022-11-18 MED ORDER — SODIUM CHLORIDE 0.9 % IV SOLN
INTRAVENOUS | Status: DC
  Administered 2022-11-18: 20 mL/h via INTRAVENOUS

## 2022-11-18 MED ORDER — PROPOFOL 1000 MG/100ML IV EMUL
INTRAVENOUS | Status: AC
  Filled 2022-11-18: qty 100

## 2022-11-18 MED ORDER — PROPOFOL 10 MG/ML IV BOLUS
INTRAVENOUS | Status: DC | PRN
  Administered 2022-11-18: 100 mg via INTRAVENOUS
  Administered 2022-11-18: 50 mg via INTRAVENOUS

## 2022-11-18 NOTE — Anesthesia Preprocedure Evaluation (Addendum)
Anesthesia Evaluation  Patient identified by MRN, date of birth, ID band Patient awake    Reviewed: Allergy & Precautions, NPO status , Patient's Chart, lab work & pertinent test results  Airway Mallampati: III  TM Distance: >3 FB Neck ROM: full    Dental no notable dental hx.    Pulmonary asthma , sleep apnea , COPD,  COPD inhaler   Pulmonary exam normal        Cardiovascular hypertension, Normal cardiovascular exam     Neuro/Psych negative neurological ROS  negative psych ROS   GI/Hepatic Neg liver ROS,GERD  Medicated,,  Endo/Other  diabetes, Well Controlled    Renal/GU negative Renal ROS  negative genitourinary   Musculoskeletal   Abdominal   Peds  Hematology negative hematology ROS (+)   Anesthesia Other Findings Past Medical History: 03/04/2021: Diabetes mellitus type 2, diet-controlled ([facility]) No date: GERD (gastroesophageal reflux disease) 03/05/2021: Hyperlipidemia LDL goal <70 No date: Mild obstructive sleep apnea, controlled with dental  appliance No date: Moderate persistent asthma No date: Sleep apnea  History reviewed. No pertinent surgical history.     Reproductive/Obstetrics negative OB ROS                             Anesthesia Physical Anesthesia Plan  ASA: 2  Anesthesia Plan: General   Post-op Pain Management:    Induction: Intravenous  PONV Risk Score and Plan: Propofol infusion and TIVA  Airway Management Planned: Natural Airway and Nasal Cannula  Additional Equipment:   Intra-op Plan:   Post-operative Plan:   Informed Consent: I have reviewed the patients History and Physical, chart, labs and discussed the procedure including the risks, benefits and alternatives for the proposed anesthesia with the patient or authorized representative who has indicated his/her understanding and acceptance.     Dental Advisory Given  Plan Discussed with:  Anesthesiologist, CRNA and Surgeon  Anesthesia Plan Comments: (Patient consented for risks of anesthesia including but not limited to:  - adverse reactions to medications - risk of airway placement if required - damage to eyes, teeth, lips or other oral mucosa - nerve damage due to positioning  - sore throat or hoarseness - Damage to heart, brain, nerves, lungs, other parts of body or loss of life  Patient voiced understanding.)       Anesthesia Quick Evaluation

## 2022-11-18 NOTE — Op Note (Signed)
[facility] Gastroenterology Patient Name: Antonio Lara Procedure Date: 11/18/2022 7:59 AM MRN: [id number] Account #: [id number] Date of Birth: 1976/03/31 Admit Type: Outpatient Age: 47 Room: [facility] ENDO ROOM 1 Gender: Male Note Status: Finalized Instrument Name: Upper Endoscope [id number] Procedure:             Upper GI endoscopy Indications:           Follow-up of gastro-esophageal reflux disease Providers:             Jonathon Bellows MD, MD Referring MD:          Maud Deed. Copland MD, MD (Referring MD) Medicines:             Monitored Anesthesia Care Complications:         No immediate complications. Procedure:             Pre-Anesthesia Assessment:                        - Prior to the procedure, a History and Physical was                         performed, and patient medications, allergies and                         sensitivities were reviewed. The patient's tolerance                         of previous anesthesia was reviewed.                        - The risks and benefits of the procedure and the                         sedation options and risks were discussed with the                         patient. All questions were answered and informed                         consent was obtained.                        - The risks and benefits of the procedure and the                         sedation options and risks were discussed with the                         patient. All questions were answered and informed                         consent was obtained.                        After obtaining informed consent, the endoscope was                         passed under direct vision. Throughout the procedure,  the patient's blood pressure, pulse, and oxygen                         saturations were monitored continuously. The                         Endosonoscope was introduced through the mouth, and                         advanced to the third  part of duodenum. The upper GI                         endoscopy was accomplished with ease. The patient                         tolerated the procedure well. Findings:      The examined duodenum was normal.      The stomach was normal.      The cardia and gastric fundus were normal on retroflexion.      The Z-line was regular and was found 45 cm from the incisors. Impression:            - Normal examined duodenum.                        - Normal stomach.                        - Z-line regular, 45 cm from the incisors.                        - No specimens collected. Recommendation:        - Discharge patient to home (with escort).                        - Resume previous diet.                        - Continue present medications.                        - Return to my office as previously scheduled. Procedure Code(s):     --- Professional ---                        [phone], Esophagogastroduodenoscopy, flexible,                         transoral; diagnostic, including collection of                         specimen(s) by brushing or washing, when performed                         (separate procedure) Diagnosis Code(s):     --- Professional ---                        K21.9, Gastro-esophageal reflux disease without                         esophagitis CPT copyright 2022 American Medical  Association. All rights reserved. The codes documented in this report are preliminary and upon coder review may  be revised to meet current compliance requirements. Jonathon Bellows, MD Jonathon Bellows MD, MD 11/18/2022 8:09:56 AM This report has been signed electronically. Number of Addenda: 0 Note Initiated On: 11/18/2022 7:59 AM Estimated Blood Loss:  Estimated blood loss: none.      [facility]

## 2022-11-18 NOTE — H&P (Signed)
Jonathon Bellows, MD [address] [place], [place], [place], [address] [address], [place], [place], [place], [address] Phone: [phone]  Fax: [phone]  Primary Care Physician:  Owens Loffler, MD   Pre-Procedure History & Physical: HPI:  Antonio Lara is a 47 y.o. male is here for an endoscopy    Past Medical History:  Diagnosis Date   Diabetes mellitus type 2, diet-controlled ([facility]) 03/04/2021   GERD (gastroesophageal reflux disease)    Hyperlipidemia LDL goal <70 03/05/2021   Mild obstructive sleep apnea, controlled with dental appliance    Moderate persistent asthma    Sleep apnea     History reviewed. No pertinent surgical history.  Prior to Admission medications   Medication Sig Start Date End Date Taking? Authorizing Provider  azelastine (OPTIVAR) 0.05 % ophthalmic solution 1 drop into affected eye 02/15/22  Yes [provider]  Azelastine HCl 137 MCG/SPRAY SOLN 1 puff in each nostril 02/15/22  Yes [provider]  cyclobenzaprine (FLEXERIL) 10 MG tablet Take 1 tablet (10 mg total) by mouth at bedtime as needed for muscle spasms. 10/13/22  Yes Copland, Frederico Hamman, MD  esomeprazole (NEXIUM) 20 MG capsule TAKE 1 CAPSULE BY MOUTH DAILY 08/08/22  Yes Copland, Frederico Hamman, MD  fluticasone (FLONASE) 50 MCG/ACT nasal spray Place into both nostrils. 02/15/22  Yes [provider]  Fluticasone-Umeclidin-Vilant Viviana Simpler ELLIPTA) 200-62.5-25 MCG/ACT AEPB 1 puff 02/15/22  Yes [provider]  levocetirizine (XYZAL) 5 MG tablet Take 5 mg by mouth every evening.   Yes [provider]  lisinopril-hydrochlorothiazide (ZESTORETIC) 20-12.5 MG tablet TAKE 1 TABLET BY MOUTH ONCE A DAY 05/11/22  Yes Copland, Frederico Hamman, MD  montelukast (SINGULAIR) 10 MG tablet TAKE 1 TABLET BY MOUTH EVERY NIGHT AT BEDTIME 10/03/22  Yes Kasa, Maretta Bees, MD  Multiple Vitamin (MULTIVITAMIN) tablet Take 1 tablet by mouth daily.   Yes [provider]   rosuvastatin (CRESTOR) 10 MG tablet TAKE 1 TABLET BY MOUTH ONCE A DAY 06/30/22  Yes Copland, Spencer, MD  VENTOLIN HFA 108 (90 Base) MCG/ACT inhaler INHALE 2 PUFFS INTO THE LUNGS EVERY 6 HOURS AS NEEDED FOR WHEEZING OR SHORTNESS OF BREATH 08/12/22  Yes Copland, Frederico Hamman, MD    Allergies as of 11/15/2022 - Review Complete 11/15/2022  Allergen Reaction Noted   Oxycodone-acetaminophen  01/02/2009   Zanaflex [tizanidine hcl] Other (See Comments) 08/11/2021    Family History  Problem Relation Age of Onset   Breast cancer Mother    Stroke Father    Cancer Maternal Grandfather     Social History   Socioeconomic History   Marital status: Married    Spouse name: Not on file   Number of children: 2   Years of education: Not on file   Highest education level: Not on file  Occupational History   Occupation: Scientist, clinical (histocompatibility and immunogenetics): [employer]  Tobacco Use   Smoking status: Never   Smokeless tobacco: Never  Vaping Use   Vaping Use: Never used  Substance and Sexual Activity   Alcohol use: Yes    Comment: rarely   Drug use: No   Sexual activity: Not on file  Other Topics Concern   Not on file  Social History Narrative   Drinks one caffeinated beverage a day.   Social Determinants of Health   Financial Resource Strain: Not on file  Food Insecurity: Not on file  Transportation Needs: Not on file  Physical Activity: Not on file  Stress: Not on file  Social Connections: Not on file  Intimate Partner Violence: Not on file    Review of Systems: See HPI, otherwise negative ROS  Physical Exam: BP 123/89   Pulse 79   Temp (!) 96.1 F (35.6 C) (Temporal)   Resp 20   Ht 6' (1.829 m)   Wt 88 kg   SpO2 100%   BMI 26.31 kg/m  General:   Alert,  pleasant and cooperative in NAD Head:  Normocephalic and atraumatic. Neck:  Supple; no masses or thyromegaly. Lungs:  Clear throughout to auscultation, normal respiratory effort.    Heart:  +S1, +S2, Regular rate and rhythm, No  edema. Abdomen:  Soft, nontender and nondistended. Normal bowel sounds, without guarding, and without rebound.   Neurologic:  Alert and  oriented x4;  grossly normal neurologically.  Impression/Plan: Antonio Lara is here for an endoscopy  to be performed for  evaluation of GERD    Risks, benefits, limitations, and alternatives regarding endoscopy have been reviewed with the patient.  Questions have been answered.  All parties agreeable.   Jonathon Bellows, MD  11/18/2022, 7:47 AM

## 2022-11-18 NOTE — Anesthesia Postprocedure Evaluation (Signed)
Anesthesia Post Note  Patient: Antonio Lara  Procedure(s) Performed: ESOPHAGOGASTRODUODENOSCOPY (EGD) WITH PROPOFOL  Patient location during evaluation: PACU Anesthesia Type: General Level of consciousness: awake and alert Pain management: pain level controlled Vital Signs Assessment: post-procedure vital signs reviewed and stable Respiratory status: spontaneous breathing, nonlabored ventilation, respiratory function stable and patient connected to nasal cannula oxygen Cardiovascular status: blood pressure returned to baseline and stable Postop Assessment: no apparent nausea or vomiting Anesthetic complications: no   There were no known notable events for this encounter.   Last Vitals:  Vitals:   11/18/22 0825 11/18/22 0835  BP: (!) 130/92 (!) 130/97  Pulse: 82 75  Resp: 18   Temp:    SpO2:      Last Pain:  Vitals:   11/18/22 0835  TempSrc:   PainSc: 0-No pain                 Ilene Qua

## 2022-11-18 NOTE — Transfer of Care (Signed)
Immediate Anesthesia Transfer of Care Note  Patient: Antonio Lara  Procedure(s) Performed: ESOPHAGOGASTRODUODENOSCOPY (EGD) WITH PROPOFOL  Patient Location: PACU  Anesthesia Type:General  Level of Consciousness: awake and sedated  Airway & Oxygen Therapy: Patient Spontanous Breathing and Patient connected to nasal cannula oxygen  Post-op Assessment: Report given to RN and Post -op Vital signs reviewed and stable  Post vital signs: Reviewed and stable  Last Vitals:  Vitals Value Taken Time  BP 119/85 11/18/22 0815  Temp    Pulse 89 11/18/22 0815  Resp 18 11/18/22 0815  SpO2 96 % 11/18/22 0815    Last Pain:  Vitals:   11/18/22 0815  TempSrc:   PainSc: 0-No pain         Complications: There were no known notable events for this encounter.

## 2022-11-21 ENCOUNTER — Encounter: Payer: Self-pay | Admitting: Gastroenterology

## 2022-12-27 ENCOUNTER — Ambulatory Visit (INDEPENDENT_AMBULATORY_CARE_PROVIDER_SITE_OTHER): Payer: Self-pay | Admitting: Physician Assistant

## 2022-12-27 ENCOUNTER — Encounter: Payer: Self-pay | Admitting: Physician Assistant

## 2022-12-27 VITALS — BP 119/80 | HR 91 | Temp 96.7°F | Resp 16 | Wt 192.2 lb

## 2022-12-27 DIAGNOSIS — M545 Low back pain, unspecified: Secondary | ICD-10-CM

## 2022-12-27 DIAGNOSIS — G8929 Other chronic pain: Secondary | ICD-10-CM

## 2022-12-27 MED ORDER — DICLOFENAC SODIUM 75 MG PO TBEC: 75.0000 mg | DELAYED_RELEASE_TABLET | Freq: Two times a day (BID) | ORAL | 0 refills | Status: DC

## 2022-12-27 NOTE — Progress Notes (Signed)
[facility] [address] [place], [place] [address]   Office Visit Note  Patient Name: Antonio Lara Date of Birth [id number]  Medical Record number [id number]  Date of Service: 12/27/2022  Chief Complaint  Patient presents with   Back Pain    Chronic LBP now with burning sensation into legs     47 y/o M presents to the clinic for c/o chronic low back pain x 15 years. Pt currently with low back pain with radiation to left upper leg currently. Pain will radiate between right or left leg. No recent MVA or trauma. No fall. Has had PT years ago, but after 3 sessions there wasn't any improvement. He has also had Prednisone multiple times during flare ups, but not recently. He admits to improvement with Prednisone for 3-4 days and then pain returns. He had massage therapy and was seen by a Chiropractor about 2-3 months ago.   Back Pain      Current Medication:  Outpatient Encounter Medications as of 12/27/2022  Medication Sig   azelastine (OPTIVAR) 0.05 % ophthalmic solution 1 drop into affected eye   Azelastine HCl 137 MCG/SPRAY SOLN 1 puff in each nostril   cyclobenzaprine (FLEXERIL) 10 MG tablet Take 1 tablet (10 mg total) by mouth at bedtime as needed for muscle spasms.   diclofenac (VOLTAREN) 75 MG EC tablet Take 1 tablet (75 mg total) by mouth 2 (two) times daily. Take with food   esomeprazole (NEXIUM) 20 MG capsule TAKE 1 CAPSULE BY MOUTH DAILY   fluticasone (FLONASE) 50 MCG/ACT nasal spray Place into both nostrils.   Fluticasone-Umeclidin-Vilant (TRELEGY ELLIPTA) 200-62.5-25 MCG/ACT AEPB 1 puff   levocetirizine (XYZAL) 5 MG tablet Take 5 mg by mouth every evening.   lisinopril-hydrochlorothiazide (ZESTORETIC) 20-12.5 MG tablet TAKE 1 TABLET BY MOUTH ONCE A DAY   montelukast (SINGULAIR) 10 MG tablet TAKE 1 TABLET BY MOUTH EVERY NIGHT AT BEDTIME   Multiple Vitamin (MULTIVITAMIN) tablet Take 1 tablet by mouth daily.   rosuvastatin (CRESTOR) 10 MG tablet TAKE 1 TABLET  BY MOUTH ONCE A DAY   VENTOLIN HFA 108 (90 Base) MCG/ACT inhaler INHALE 2 PUFFS INTO THE LUNGS EVERY 6 HOURS AS NEEDED FOR WHEEZING OR SHORTNESS OF BREATH   No facility-administered encounter medications on file as of 12/27/2022.      Medical History: Past Medical History:  Diagnosis Date   Diabetes mellitus type 2, diet-controlled ([facility]) 03/04/2021   GERD (gastroesophageal reflux disease)    Hyperlipidemia LDL goal <70 03/05/2021   Mild obstructive sleep apnea, controlled with dental appliance    Moderate persistent asthma    Sleep apnea      Vital Signs: BP 119/80   Pulse 91   Temp (!) 96.7 F (35.9 C) (Tympanic)   Resp 16   Wt 192 lb 3.2 oz (87.2 kg)   SpO2 99%   BMI 26.07 kg/m    Review of Systems  Constitutional: Negative.   Musculoskeletal:  Positive for back pain, myalgias and neck pain. Negative for arthralgias, gait problem, joint swelling and neck stiffness.  Skin: Negative.     Physical Exam Musculoskeletal:     Lumbar back: Tenderness present. No bony tenderness. Normal range of motion. Negative right straight leg raise test and negative left straight leg raise test.     Comments: + mild TTP over the right spinal musculature. + pain with trunk rotation to the right and right lateral side bend.  Negative SLR b/l Normal leg extension Strength LLE:  4/5; RLE :5/5  Skin:    General: Skin is warm.  Neurological:     Gait: Gait normal.  Psychiatric:        Mood and Affect: Mood normal.        Behavior: Behavior normal.        Thought Content: Thought content normal.       Assessment/Plan:  1. Chronic midline low back pain, unspecified whether sciatica present - diclofenac (VOLTAREN) 75 MG EC tablet; Take 1 tablet (75 mg total) by mouth 2 (two) times daily. Take with food  Dispense: 30 tablet; Refill: 0  Continue home exercise program Apply ice alternate with moist heat Take medicine as prescribed Discontinue with OTC NSAIDs if taking Follow up  with PCP for a possible referral to Physiatrist for chronic low back pain for further evaluation.  Pt verbalized understanding and in agreement.    General Counseling: Vale verbalizes understanding of the findings of todays visit and agrees with plan of treatment. I have discussed any further diagnostic evaluation that may be needed or ordered today. We also reviewed his medications today. he has been encouraged to call the office with any questions or concerns that should arise related to todays visit.    Time spent:20 Minutes    Gilberto Better, [place] Physician Assistant

## 2023-01-17 ENCOUNTER — Ambulatory Visit: Payer: BC Managed Care – PPO | Admitting: Gastroenterology

## 2023-04-04 ENCOUNTER — Other Ambulatory Visit: Payer: Self-pay | Admitting: Internal Medicine

## 2023-04-04 ENCOUNTER — Ambulatory Visit (INDEPENDENT_AMBULATORY_CARE_PROVIDER_SITE_OTHER): Payer: Self-pay | Admitting: Physician Assistant

## 2023-04-04 ENCOUNTER — Encounter: Payer: Self-pay | Admitting: Physician Assistant

## 2023-04-04 VITALS — BP 100/74 | HR 79 | Temp 96.9°F

## 2023-04-04 DIAGNOSIS — W540XXA Bitten by dog, initial encounter: Secondary | ICD-10-CM

## 2023-04-04 DIAGNOSIS — S61551A Open bite of right wrist, initial encounter: Secondary | ICD-10-CM

## 2023-04-04 MED ORDER — AMOXICILLIN-POT CLAVULANATE 875-125 MG PO TABS: 1.0000 | ORAL_TABLET | Freq: Two times a day (BID) | ORAL | 0 refills | Status: DC

## 2023-04-04 NOTE — Progress Notes (Signed)
[facility]rapist, [facility] [address] [place], [place] [address]   Office Visit Note  Patient Name: Antonio Lara Date of Birth [id number]  Medical Record number [id number]  Date of Service: 04/04/2023  Chief Complaint  Patient presents with   Acute Visit    Patient sustained a bite from his dog on his R arm this morning. He states the dog is up-to-date on his vaccinations.      47 y/o M presents to the clinic for c/o a dog bite on his R wrist by his dog earlier today. Slight bleeding, but patient washed it with soapy water. Doing well. Tetanus is up to date.       Current Medication:  Outpatient Encounter Medications as of 04/04/2023  Medication Sig   amoxicillin-clavulanate (AUGMENTIN) 875-125 MG tablet Take 1 tablet by mouth 2 (two) times daily.   azelastine (OPTIVAR) 0.05 % ophthalmic solution 1 drop into affected eye   Azelastine HCl 137 MCG/SPRAY SOLN 1 puff in each nostril   cyclobenzaprine (FLEXERIL) 10 MG tablet Take 1 tablet (10 mg total) by mouth at bedtime as needed for muscle spasms.   esomeprazole (NEXIUM) 20 MG capsule TAKE 1 CAPSULE BY MOUTH DAILY   fluticasone (FLONASE) 50 MCG/ACT nasal spray Place into both nostrils as needed.   Fluticasone-Umeclidin-Vilant (TRELEGY ELLIPTA) 200-62.5-25 MCG/ACT AEPB Inhale 1 puff into the lungs daily.   levocetirizine (XYZAL) 5 MG tablet Take 5 mg by mouth every evening.   lisinopril-hydrochlorothiazide (ZESTORETIC) 20-12.5 MG tablet TAKE 1 TABLET BY MOUTH ONCE A DAY   montelukast (SINGULAIR) 10 MG tablet TAKE 1 TABLET BY MOUTH EVERY NIGHT AT BEDTIME   Multiple Vitamin (MULTIVITAMIN) tablet Take 1 tablet by mouth daily.   rosuvastatin (CRESTOR) 10 MG tablet TAKE 1 TABLET BY MOUTH ONCE A DAY   VENTOLIN HFA 108 (90 Base) MCG/ACT inhaler INHALE 2 PUFFS INTO THE LUNGS EVERY 6 HOURS AS NEEDED FOR WHEEZING OR SHORTNESS OF BREATH   [DISCONTINUED] diclofenac (VOLTAREN) 75 MG EC tablet Take 1 tablet (75 mg total) by mouth 2 (two) times  daily. Take with food   No facility-administered encounter medications on file as of 04/04/2023.      Medical History: Past Medical History:  Diagnosis Date   Diabetes mellitus type 2, diet-controlled (HCC) 03/04/2021   GERD (gastroesophageal reflux disease)    Hyperlipidemia LDL goal <70 03/05/2021   Mild obstructive sleep apnea, controlled with dental appliance    Moderate persistent asthma    Sleep apnea      Vital Signs: BP 100/74 (BP Location: Left Arm, Patient Position: Sitting, Cuff Size: Normal)   Pulse 79   Temp (!) 96.9 F (36.1 C)   SpO2 98%    Review of Systems  Constitutional: Negative.   Skin:  Positive for wound.    Physical Exam Constitutional:      Appearance: Normal appearance.  HENT:     Head: Normocephalic and atraumatic.     Right Ear: External ear normal.     Left Ear: External ear normal.  Eyes:     Extraocular Movements: Extraocular movements intact.  Musculoskeletal:     Comments: R wrist/Hand: Full ROM. Normal grasping strength. Sensation is grossly normal distally.   Skin:    General: Skin is warm and dry.          Comments: R dorsal wrist: puncture wound present on the dorsal aspect of the wrist with superficial scrapes adjacent. Not actively bleeding.   Neurological:  Mental Status: He is alert and oriented to person, place, and time.  Psychiatric:        Mood and Affect: Mood normal.        Behavior: Behavior normal.       Assessment/Plan:  1. Dog bite of right wrist, initial encounter - amoxicillin-clavulanate (AUGMENTIN) 875-125 MG tablet; Take 1 tablet by mouth 2 (two) times daily.  Dispense: 20 tablet; Refill: 0  Cleaned wound with Iodine swab. Applied a knuckle band aid. Reviewed wound precautions with patient. He verbalized understanding Take medicine as prescribed. Continue to watch for sings of infection. RTC if any difficulty arises. Pt verbalized understanding and in agreement.    General Counseling:  Antonio Lara verbalizes understanding of the findings of todays visit and agrees with plan of treatment. I have discussed any further diagnostic evaluation that may be needed or ordered today. We also reviewed his medications today. he has been encouraged to call the office with any questions or concerns that should arise related to todays visit.    Time spent:20 Minutes    Gilberto Better, [place] Physician Assistant

## 2023-04-10 ENCOUNTER — Encounter: Payer: Self-pay | Admitting: Physician Assistant

## 2023-05-05 ENCOUNTER — Telehealth: Payer: BC Managed Care – PPO | Admitting: Physician Assistant

## 2023-05-05 DIAGNOSIS — J208 Acute bronchitis due to other specified organisms: Secondary | ICD-10-CM | POA: Diagnosis not present

## 2023-05-05 DIAGNOSIS — J4541 Moderate persistent asthma with (acute) exacerbation: Secondary | ICD-10-CM | POA: Diagnosis not present

## 2023-05-05 DIAGNOSIS — B9689 Other specified bacterial agents as the cause of diseases classified elsewhere: Secondary | ICD-10-CM | POA: Diagnosis not present

## 2023-05-05 MED ORDER — PREDNISONE 20 MG PO TABS: 40.0000 mg | ORAL_TABLET | Freq: Every day | ORAL | 0 refills | Status: DC

## 2023-05-05 MED ORDER — AZITHROMYCIN 250 MG PO TABS: ORAL_TABLET | ORAL | 0 refills | Status: AC

## 2023-05-05 MED ORDER — PROMETHAZINE-DM 6.25-15 MG/5ML PO SYRP: 5.0000 mL | ORAL_SOLUTION | Freq: Four times a day (QID) | ORAL | 0 refills | Status: DC | PRN

## 2023-05-05 NOTE — Patient Instructions (Signed)
Antonio Lara, thank you for joining Antonio Loveless, PA-C for today's virtual visit.  While this provider is not your primary care provider (PCP), if your PCP is located in our provider database this encounter information will be shared with them immediately following your visit.   A [facility] MyChart account gives you access to today's visit and all your visits, tests, and labs performed at [facility] " click here if you don't have a [facility] MyChart account or go to [email or web address]  Consent: (Patient) Antonio Lara provided verbal consent for this virtual visit at the beginning of the encounter.  Current Medications:  Current Outpatient Medications:    azithromycin (ZITHROMAX) 250 MG tablet, Take 2 tablets on day 1, then 1 tablet daily on days 2 through 5, Disp: 6 tablet, Rfl: 0   predniSONE (DELTASONE) 20 MG tablet, Take 2 tablets (40 mg total) by mouth daily with breakfast., Disp: 10 tablet, Rfl: 0   promethazine-dextromethorphan (PROMETHAZINE-DM) 6.25-15 MG/5ML syrup, Take 5 mLs by mouth 4 (four) times daily as needed., Disp: 118 mL, Rfl: 0   amoxicillin-clavulanate (AUGMENTIN) 875-125 MG tablet, Take 1 tablet by mouth 2 (two) times daily., Disp: 20 tablet, Rfl: 0   azelastine (OPTIVAR) 0.05 % ophthalmic solution, 1 drop into affected eye, Disp: , Rfl:    Azelastine HCl 137 MCG/SPRAY SOLN, 1 puff in each nostril, Disp: , Rfl:    cyclobenzaprine (FLEXERIL) 10 MG tablet, Take 1 tablet (10 mg total) by mouth at bedtime as needed for muscle spasms., Disp: 30 tablet, Rfl: 1   esomeprazole (NEXIUM) 20 MG capsule, TAKE 1 CAPSULE BY MOUTH DAILY, Disp: 90 capsule, Rfl: 3   fluticasone (FLONASE) 50 MCG/ACT nasal spray, Place into both nostrils as needed., Disp: , Rfl:    Fluticasone-Umeclidin-Vilant (TRELEGY ELLIPTA) 200-62.5-25 MCG/ACT AEPB, Inhale 1 puff into the lungs daily., Disp: , Rfl:    levocetirizine (XYZAL) 5 MG tablet, Take 5 mg by mouth every  evening., Disp: , Rfl:    lisinopril-hydrochlorothiazide (ZESTORETIC) 20-12.5 MG tablet, TAKE 1 TABLET BY MOUTH ONCE A DAY, Disp: 90 tablet, Rfl: 3   montelukast (SINGULAIR) 10 MG tablet, TAKE ONE TABLET BY MOUTH EVERY NIGHT AT BEDTIME, Disp: 30 tablet, Rfl: 5   Multiple Vitamin (MULTIVITAMIN) tablet, Take 1 tablet by mouth daily., Disp: , Rfl:    rosuvastatin (CRESTOR) 10 MG tablet, TAKE 1 TABLET BY MOUTH ONCE A DAY, Disp: 90 tablet, Rfl: 3   VENTOLIN HFA 108 (90 Base) MCG/ACT inhaler, INHALE 2 PUFFS INTO THE LUNGS EVERY 6 HOURS AS NEEDED FOR WHEEZING OR SHORTNESS OF BREATH, Disp: 18 g, Rfl: 2   Medications ordered in this encounter:  Meds ordered this encounter  Medications   azithromycin (ZITHROMAX) 250 MG tablet    Sig: Take 2 tablets on day 1, then 1 tablet daily on days 2 through 5    Dispense:  6 tablet    Refill:  0    Order Specific Question:   Supervising Provider    Answer:   Merrilee Jansky [[id number]]   predniSONE (DELTASONE) 20 MG tablet    Sig: Take 2 tablets (40 mg total) by mouth daily with breakfast.    Dispense:  10 tablet    Refill:  0    Order Specific Question:   Supervising Provider    Answer:   Merrilee Jansky [[id number]]   promethazine-dextromethorphan (PROMETHAZINE-DM) 6.25-15 MG/5ML syrup    Sig: Take 5 mLs by mouth 4 (four) times daily as needed.  Dispense:  118 mL    Refill:  0    Order Specific Question:   Supervising Provider    Answer:   Merrilee Jansky [[id number]]     *If you need refills on other medications prior to your next appointment, please contact your pharmacy*  Follow-Up: Call back or seek an in-person evaluation if the symptoms worsen or if the condition fails to improve as anticipated.  [facility] [phone]  Other Instructions Acute Bronchitis, Adult  Acute bronchitis is sudden inflammation of the main airways (bronchi) that come off the windpipe (trachea) in the lungs. The swelling causes the airways to get  smaller and make more mucus than normal. This can make it hard to breathe and can cause coughing or noisy breathing (wheezing). Acute bronchitis may last several weeks. The cough may last longer. Allergies, asthma, and exposure to smoke may make the condition worse. What are the causes? This condition can be caused by germs and by substances that irritate the lungs, including: Cold and flu viruses. The most common cause of this condition is the virus that causes the common cold. Bacteria. This is less common. Breathing in substances that irritate the lungs, including: Smoke from cigarettes and other forms of tobacco. Dust and pollen. Fumes from household cleaning products, gases, or burned fuel. Indoor or outdoor air pollution. What increases the risk? The following factors may make you more likely to develop this condition: A weak body's defense system, also called the immune system. A condition that affects your lungs and breathing, such as asthma. What are the signs or symptoms? Common symptoms of this condition include: Coughing. This may bring up clear, yellow, or green mucus from your lungs (sputum). Wheezing. Runny or stuffy nose. Having too much mucus in your lungs (chest congestion). Shortness of breath. Aches and pains, including sore throat or chest. How is this diagnosed? This condition is usually diagnosed based on: Your symptoms and medical history. A physical exam. You may also have other tests, including tests to rule out other conditions, such as pneumonia. These tests include: A test of lung function. Test of a mucus sample to look for the presence of bacteria. Tests to check the oxygen level in your blood. Blood tests. Chest X-ray. How is this treated? Most cases of acute bronchitis clear up over time without treatment. Your health care provider may recommend: Drinking more fluids to help thin your mucus so it is easier to cough up. Taking inhaled medicine  (inhaler) to improve air flow in and out of your lungs. Using a vaporizer or a humidifier. These are machines that add water to the air to help you breathe better. Taking a medicine that thins mucus and clears congestion (expectorant). Taking a medicine that prevents or stops coughing (cough suppressant). It is not common to take an antibiotic medicine for this condition. Follow these instructions at home:  Take over-the-counter and prescription medicines only as told by your health care provider. Use an inhaler, vaporizer, or humidifier as told by your health care provider. Take two teaspoons (10 mL) of honey at bedtime to lessen coughing at night. Drink enough fluid to keep your urine pale yellow. Do not use any products that contain nicotine or tobacco. These products include cigarettes, chewing tobacco, and vaping devices, such as e-cigarettes. If you need help quitting, ask your health care provider. Get plenty of rest. Return to your normal activities as told by your health care provider. Ask your health care provider  what activities are safe for you. Keep all follow-up visits. This is important. How is this prevented? To lower your risk of getting this condition again: Wash your hands often with soap and water for at least 20 seconds. If soap and water are not available, use hand sanitizer. Avoid contact with people who have cold symptoms. Try not to touch your mouth, nose, or eyes with your hands. Avoid breathing in smoke or chemical fumes. Breathing smoke or chemical fumes will make your condition worse. Get the flu shot every year. Contact a health care provider if: Your symptoms do not improve after 2 weeks. You have trouble coughing up the mucus. Your cough keeps you awake at night. You have a fever. Get help right away if you: Cough up blood. Feel pain in your chest. Have severe shortness of breath. Faint or keep feeling like you are going to faint. Have a severe  headache. Have a fever or chills that get worse. These symptoms may represent a serious problem that is an emergency. Do not wait to see if the symptoms will go away. Get medical help right away. Call your local emergency services (911 in the [place] Do not drive yourself to the hospital. Summary Acute bronchitis is inflammation of the main airways (bronchi) that come off the windpipe (trachea) in the lungs. The swelling causes the airways to get smaller and make more mucus than normal. Drinking more fluids can help thin your mucus so it is easier to cough up. Take over-the-counter and prescription medicines only as told by your health care provider. Do not use any products that contain nicotine or tobacco. These products include cigarettes, chewing tobacco, and vaping devices, such as e-cigarettes. If you need help quitting, ask your health care provider. Contact a health care provider if your symptoms do not improve after 2 weeks. This information is not intended to replace advice given to you by your health care provider. Make sure you discuss any questions you have with your health care provider. Document Revised: 11/18/2021 Document Reviewed: 12/09/2020 Elsevier Patient Education  2024 Elsevier Inc.    If you have been instructed to have an in-person evaluation today at a local Urgent Care facility, please use the link below. It will take you to a list of all of our available [facility], including address, phone number and hours of operation. Please do not delay care.  [facility]  If you or a family member do not have a primary care provider, use the link below to schedule a visit and establish care. When you choose a [facility] primary care physician or advanced practice provider, you gain a long-term partner in health. Find a Primary Care Provider  Learn more about [facility]'s in-office and virtual care options: [facility] - Get Care Now

## 2023-05-05 NOTE — Progress Notes (Signed)
Virtual Visit Consent   Antonio Lara, you are scheduled for a virtual visit with a [facility] provider today. Just as with appointments in the office, your consent must be obtained to participate. Your consent will be active for this visit and any virtual visit you may have with one of our providers in the next 365 days. If you have a MyChart account, a copy of this consent can be sent to you electronically.  As this is a virtual visit, video technology does not allow for your provider to perform a traditional examination. This may limit your provider's ability to fully assess your condition. If your provider identifies any concerns that need to be evaluated in person or the need to arrange testing (such as labs, EKG, etc.), we will make arrangements to do so. Although advances in technology are sophisticated, we cannot ensure that it will always work on either your end or our end. If the connection with a video visit is poor, the visit may have to be switched to a telephone visit. With either a video or telephone visit, we are not always able to ensure that we have a secure connection.  By engaging in this virtual visit, you consent to the provision of healthcare and authorize for your insurance to be billed (if applicable) for the services provided during this visit. Depending on your insurance coverage, you may receive a charge related to this service.  I need to obtain your verbal consent now. Are you willing to proceed with your visit today? Antonio Lara has provided verbal consent on 05/05/2023 for a virtual visit (video or telephone). Margaretann Loveless, PA-C  Date: 05/05/2023 7:08 PM  Virtual Visit via Video Note   I, Margaretann Loveless, connected with  Antonio Lara  ([id number], March 28, 1976) on 05/05/23 at  7:00 PM EDT by a video-enabled telemedicine application and verified that I am speaking with the correct person using two identifiers.  Location: Patient: Virtual Visit  Location Patient: Home Provider: Virtual Visit Location Provider: Home Office   I discussed the limitations of evaluation and management by telemedicine and the availability of in person appointments. The patient expressed understanding and agreed to proceed.    History of Present Illness: Antonio Lara is a 47 y.o. who identifies as a male who was assigned male at birth, and is being seen today for cough and congestion.  HPI: Cough This is a new problem. The current episode started yesterday. The problem has been gradually worsening. The problem occurs constantly. The cough is Productive of sputum and productive of purulent sputum. Associated symptoms include chills, a fever, headaches, myalgias, nasal congestion, postnasal drip, rhinorrhea, a sore throat and shortness of breath (during coughing spell). Pertinent negatives include no sweats or wheezing. The symptoms are aggravated by lying down. He has tried nothing for the symptoms. The treatment provided no relief. His past medical history is significant for asthma.     Problems:  Patient Active Problem List   Diagnosis Date Noted   Allergic rhinitis due to animal (cat) (dog) hair and dander 05/06/2022   Chronic allergic conjunctivitis 05/06/2022   Mild persistent asthma, uncomplicated 05/06/2022   Vasomotor rhinitis 05/06/2022   Hyperlipidemia LDL goal <70 03/05/2021   Diabetes mellitus type 2, diet-controlled (HCC) 03/04/2021   Hypertension, essential 10/19/2020   Moderate persistent asthma with exacerbation 03/21/2019   GERD 07/10/2009   Allergic rhinitis 04/08/2008    Allergies:  Allergies  Allergen Reactions   Oxycodone-Acetaminophen  REACTION: extemely dizzy with nausea and vomiting.   Zanaflex [Tizanidine Hcl] Other (See Comments)    Felt unusual and had some vivid visuals   Medications:  Current Outpatient Medications:    azithromycin (ZITHROMAX) 250 MG tablet, Take 2 tablets on day 1, then 1 tablet daily on  days 2 through 5, Disp: 6 tablet, Rfl: 0   predniSONE (DELTASONE) 20 MG tablet, Take 2 tablets (40 mg total) by mouth daily with breakfast., Disp: 10 tablet, Rfl: 0   promethazine-dextromethorphan (PROMETHAZINE-DM) 6.25-15 MG/5ML syrup, Take 5 mLs by mouth 4 (four) times daily as needed., Disp: 118 mL, Rfl: 0   amoxicillin-clavulanate (AUGMENTIN) 875-125 MG tablet, Take 1 tablet by mouth 2 (two) times daily., Disp: 20 tablet, Rfl: 0   azelastine (OPTIVAR) 0.05 % ophthalmic solution, 1 drop into affected eye, Disp: , Rfl:    Azelastine HCl 137 MCG/SPRAY SOLN, 1 puff in each nostril, Disp: , Rfl:    cyclobenzaprine (FLEXERIL) 10 MG tablet, Take 1 tablet (10 mg total) by mouth at bedtime as needed for muscle spasms., Disp: 30 tablet, Rfl: 1   esomeprazole (NEXIUM) 20 MG capsule, TAKE 1 CAPSULE BY MOUTH DAILY, Disp: 90 capsule, Rfl: 3   fluticasone (FLONASE) 50 MCG/ACT nasal spray, Place into both nostrils as needed., Disp: , Rfl:    Fluticasone-Umeclidin-Vilant (TRELEGY ELLIPTA) 200-62.5-25 MCG/ACT AEPB, Inhale 1 puff into the lungs daily., Disp: , Rfl:    levocetirizine (XYZAL) 5 MG tablet, Take 5 mg by mouth every evening., Disp: , Rfl:    lisinopril-hydrochlorothiazide (ZESTORETIC) 20-12.5 MG tablet, TAKE 1 TABLET BY MOUTH ONCE A DAY, Disp: 90 tablet, Rfl: 3   montelukast (SINGULAIR) 10 MG tablet, TAKE ONE TABLET BY MOUTH EVERY NIGHT AT BEDTIME, Disp: 30 tablet, Rfl: 5   Multiple Vitamin (MULTIVITAMIN) tablet, Take 1 tablet by mouth daily., Disp: , Rfl:    rosuvastatin (CRESTOR) 10 MG tablet, TAKE 1 TABLET BY MOUTH ONCE A DAY, Disp: 90 tablet, Rfl: 3   VENTOLIN HFA 108 (90 Base) MCG/ACT inhaler, INHALE 2 PUFFS INTO THE LUNGS EVERY 6 HOURS AS NEEDED FOR WHEEZING OR SHORTNESS OF BREATH, Disp: 18 g, Rfl: 2  Observations/Objective: Patient is well-developed, well-nourished in no acute distress.  Resting comfortably at home.  Head is normocephalic, atraumatic.  No labored breathing.  Speech is clear  and coherent with logical content.  Patient is alert and oriented at baseline.    Assessment and Plan: 1. Acute bacterial bronchitis - azithromycin (ZITHROMAX) 250 MG tablet; Take 2 tablets on day 1, then 1 tablet daily on days 2 through 5  Dispense: 6 tablet; Refill: 0 - predniSONE (DELTASONE) 20 MG tablet; Take 2 tablets (40 mg total) by mouth daily with breakfast.  Dispense: 10 tablet; Refill: 0 - promethazine-dextromethorphan (PROMETHAZINE-DM) 6.25-15 MG/5ML syrup; Take 5 mLs by mouth 4 (four) times daily as needed.  Dispense: 118 mL; Refill: 0  2. Moderate persistent asthma with exacerbation - azithromycin (ZITHROMAX) 250 MG tablet; Take 2 tablets on day 1, then 1 tablet daily on days 2 through 5  Dispense: 6 tablet; Refill: 0 - predniSONE (DELTASONE) 20 MG tablet; Take 2 tablets (40 mg total) by mouth daily with breakfast.  Dispense: 10 tablet; Refill: 0 - promethazine-dextromethorphan (PROMETHAZINE-DM) 6.25-15 MG/5ML syrup; Take 5 mLs by mouth 4 (four) times daily as needed.  Dispense: 118 mL; Refill: 0  - Worsening over a week despite OTC medications - Will treat with Z-pack, Prednisone, and promethazine DM cough syrup - Can continue Mucinex  -  Continue inhalers as prescribed for asthma - Push fluids.  - Rest.  - Steam and humidifier can help - Seek in person evaluation if worsening or symptoms fail to improve    Follow Up Instructions: I discussed the assessment and treatment plan with the patient. The patient was provided an opportunity to ask questions and all were answered. The patient agreed with the plan and demonstrated an understanding of the instructions.  A copy of instructions were sent to the patient via MyChart unless otherwise noted below.    The patient was advised to call back or seek an in-person evaluation if the symptoms worsen or if the condition fails to improve as anticipated.  Time:  I spent 10 minutes with the patient via telehealth technology  discussing the above problems/concerns.    Margaretann Loveless, PA-C

## 2023-06-13 ENCOUNTER — Other Ambulatory Visit: Payer: Self-pay | Admitting: Family Medicine

## 2023-06-13 NOTE — Telephone Encounter (Signed)
Please call and schedule CPE with fasting labs prior with Dr. Copland.  

## 2023-06-13 NOTE — Telephone Encounter (Signed)
Spoke to pt, pt states he'll call back to schedule

## 2023-06-26 ENCOUNTER — Telehealth: Payer: BC Managed Care – PPO | Admitting: Nurse Practitioner

## 2023-06-26 ENCOUNTER — Encounter: Payer: Self-pay | Admitting: Nurse Practitioner

## 2023-06-26 DIAGNOSIS — J4540 Moderate persistent asthma, uncomplicated: Secondary | ICD-10-CM

## 2023-06-26 DIAGNOSIS — G4733 Obstructive sleep apnea (adult) (pediatric): Secondary | ICD-10-CM | POA: Diagnosis not present

## 2023-06-26 DIAGNOSIS — J302 Other seasonal allergic rhinitis: Secondary | ICD-10-CM

## 2023-06-26 NOTE — Progress Notes (Signed)
Patient ID: Antonio Lara, male     DOB: Oct 04, 1975, 47 y.o.      MRN: [id number]  Chief Complaint  Patient presents with   Follow-up    Yearly follow up.   Intermittent asthma exacerbations.  Doing well today.    Virtual Visit via Video Note  I connected with Antonio Lara on 06/26/23 at  8:30 AM EST by a video enabled telemedicine application and verified that I am speaking with the correct person using two identifiers.  Location: Patient: Home Provider: Office   I discussed the limitations of evaluation and management by telemedicine and the availability of in person appointments. The patient expressed understanding and agreed to proceed.  History of Present Illness: 46 year old male, never smoker followed for asthma and mild OSA. He is a patient of Dr. Clovis Fredrickson and last seen in office 06/29/2022. Past medical history significant for HTN, allergic rhinitis, GERD, DM, HLD.  TESTS/EVENTS: 06/14/2013 spirometry: FEV1 3.85 (85%), FEV1/FVC 79% 02/02/2016 sleep study: AHI 11.8/h 02/26/2016 spirometry: FEV1 3.81 (84%), FEV1/FVC 81% 02/15/2021 CXR: lungs are clear  06/29/2022: OV with Dr. Belia Heman. Asthma seems to be well-controlled. Uses albuterol infrequently. Started on Trelegy by Dr. Forestbrook Callas with allergy/immunology. Doing well with this. Mild OSA - wears a dental device.   06/26/2023: Today - follow up Patient presents today for follow up. Doing well for the most part since he was here a year ago. He did have a bout of bronchitis about 1-1.5 months ago and had to have steroids/abx. He's recovered well from this. Other than this, has not required steroids for his breathing this year. Currently breathing feels stable. No wheezing, chest congestion, cough. Symptoms are triggered by weather changes. He hasn't had to use his albuterol in 2-3 weeks now. Using Trelegy daily. Takes singulair, Xyzal, astelin and flonase.  He had to have a crown put on by his dentist. His oral appliance won't fit  after this. He's taking it back to his dentist to see if they can adjust it. He's sleeping okay. Snores without it. No drowsy driving.   Allergies  Allergen Reactions   Oxycodone-Acetaminophen     REACTION: extemely dizzy with nausea and vomiting.   Zanaflex [Tizanidine Hcl] Other (See Comments)    Felt unusual and had some vivid visuals   Immunization History  Administered Date(s) Administered   Influenza,inj,Quad PF,6+ Mos 07/09/2015, 05/29/2019   Influenza-Unspecified 05/13/2020, 06/08/2021, 06/08/2021   Janssen (J&J) SARS-COV-2 Vaccination 01/01/2020   Pneumococcal Polysaccharide-23 07/09/2015   Td 08/22/1996, 03/31/2009   Tdap 07/15/2019   Past Medical History:  Diagnosis Date   Diabetes mellitus type 2, diet-controlled (HCC) 03/04/2021   GERD (gastroesophageal reflux disease)    Hyperlipidemia LDL goal <70 03/05/2021   Mild obstructive sleep apnea, controlled with dental appliance    Moderate persistent asthma    Sleep apnea     Tobacco History: Social History   Tobacco Use  Smoking Status Never  Smokeless Tobacco Never   Counseling given: Not Answered   Outpatient Medications Prior to Visit  Medication Sig Dispense Refill   azelastine (OPTIVAR) 0.05 % ophthalmic solution 1 drop into affected eye     Azelastine HCl 137 MCG/SPRAY SOLN 1 puff in each nostril     esomeprazole (NEXIUM) 20 MG capsule TAKE 1 CAPSULE BY MOUTH DAILY 90 capsule 3   fluticasone (FLONASE) 50 MCG/ACT nasal spray Place into both nostrils as needed.     Fluticasone-Umeclidin-Vilant (TRELEGY ELLIPTA) 200-62.5-25 MCG/ACT AEPB Inhale 1 puff  into the lungs daily.     levocetirizine (XYZAL) 5 MG tablet Take 5 mg by mouth every evening.     lisinopril-hydrochlorothiazide (ZESTORETIC) 20-12.5 MG tablet TAKE ONE TABLET BY MOUTH ONCE A DAY 90 tablet 0   montelukast (SINGULAIR) 10 MG tablet TAKE ONE TABLET BY MOUTH EVERY NIGHT AT BEDTIME 30 tablet 5   Multiple Vitamin (MULTIVITAMIN) tablet Take 1 tablet  by mouth daily.     rosuvastatin (CRESTOR) 10 MG tablet TAKE 1 TABLET BY MOUTH ONCE A DAY 90 tablet 3   VENTOLIN HFA 108 (90 Base) MCG/ACT inhaler INHALE 2 PUFFS INTO THE LUNGS EVERY 6 HOURS AS NEEDED FOR WHEEZING OR SHORTNESS OF BREATH 18 g 2   amoxicillin-clavulanate (AUGMENTIN) 875-125 MG tablet Take 1 tablet by mouth 2 (two) times daily. (Patient not taking: Reported on 06/26/2023) 20 tablet 0   cyclobenzaprine (FLEXERIL) 10 MG tablet Take 1 tablet (10 mg total) by mouth at bedtime as needed for muscle spasms. (Patient not taking: Reported on 06/26/2023) 30 tablet 1   predniSONE (DELTASONE) 20 MG tablet Take 2 tablets (40 mg total) by mouth daily with breakfast. (Patient not taking: Reported on 06/26/2023) 10 tablet 0   promethazine-dextromethorphan (PROMETHAZINE-DM) 6.25-15 MG/5ML syrup Take 5 mLs by mouth 4 (four) times daily as needed. (Patient not taking: Reported on 06/26/2023) 118 mL 0   No facility-administered medications prior to visit.     Review of Systems:   Constitutional: No weight loss or gain, night sweats, fevers, chills, fatigue, or lassitude. HEENT: No headaches, difficulty swallowing, tooth/dental problems, or sore throat. No sneezing, itching, ear ache. +occasional nasal congestion, post nasal drip CV:  No chest pain, orthopnea, PND, swelling in lower extremities, anasarca, dizziness, palpitations, syncope Resp: +occasional shortness of breath with exertion; rare wheeze. No excess mucus or change in color of mucus. No productive or non-productive. No hemoptysis. No wheezing.  No chest wall deformity GI:  No heartburn, indigestion Skin: No rash, lesions, ulcerations MSK:  No joint pain or swelling.   Neuro: No dizziness or lightheadedness.  Psych: No depression or anxiety. Mood stable.   Observations/Objective: Patient is well-developed, well-nourished in no acute distress. A&Ox3. Resting comfortably at home. Unlabored breathing. Speech is clear and coherent with logical  content.   Assessment and Plan: Moderate persistent allergic asthma Compensated on current regimen. He had one exacerbation this year requiring steroids/abx. Recovered well. Understands to notify us if he has recurrent flares or increased SABA use. Action plan in place.   Patient Instructions  Continue Trelegy 1 puff daily. Brush tongue and rinse mouth afterwards Continue Albuterol inhaler 2 puffs every 6 hours as needed for shortness of breath or wheezing. Notify if symptoms persist despite rescue inhaler/neb use. Continue singulair 1 tab At bedtime Continue xyzal daily Continue astelin Twice daily  Continue flonase daily  Take your oral appliance to your dentist. If they can't adjust it, return to the dentist that made the device to have them fix it for you  Asthma action plan: START rescue up to four times a day for worsening shortness of breath, wheezing and cough. If you symptoms do not improve in 24-48 hours, contact us for steroid course. If your symptoms rapidly worsen, you have trouble talking or extreme difficulty breathing, or you're not getting relief from your nebulizer/rescue, go to the emergency department.  Follow up in one year with Dr. Belia Heman. If symptoms worsen, please contact office for sooner follow up or seek emergency care.    Allergic rhinitis  Stable on current regimen  Mild obstructive sleep apnea Mild OSA. Has good success with oral appliance. Device needs adjusting. Encouraged to resume wearing once this is addressed. Aware of safe driving practices.     I discussed the assessment and treatment plan with the patient. The patient was provided an opportunity to ask questions and all were answered. The patient agreed with the plan and demonstrated an understanding of the instructions.   The patient was advised to call back or seek an in-person evaluation if the symptoms worsen or if the condition fails to improve as anticipated.  I provided 22 minutes of  non-face-to-face time during this encounter.   Noemi Chapel, NP

## 2023-06-26 NOTE — Assessment & Plan Note (Signed)
Compensated on current regimen. He had one exacerbation this year requiring steroids/abx. Recovered well. Understands to notify us if he has recurrent flares or increased SABA use. Action plan in place.   Patient Instructions  Continue Trelegy 1 puff daily. Brush tongue and rinse mouth afterwards Continue Albuterol inhaler 2 puffs every 6 hours as needed for shortness of breath or wheezing. Notify if symptoms persist despite rescue inhaler/neb use. Continue singulair 1 tab At bedtime Continue xyzal daily Continue astelin Twice daily  Continue flonase daily  Take your oral appliance to your dentist. If they can't adjust it, return to the dentist that made the device to have them fix it for you  Asthma action plan: START rescue up to four times a day for worsening shortness of breath, wheezing and cough. If you symptoms do not improve in 24-48 hours, contact us for steroid course. If your symptoms rapidly worsen, you have trouble talking or extreme difficulty breathing, or you're not getting relief from your nebulizer/rescue, go to the emergency department.  Follow up in one year with Dr. Belia Heman. If symptoms worsen, please contact office for sooner follow up or seek emergency care.

## 2023-06-26 NOTE — Assessment & Plan Note (Signed)
Stable on current regimen   

## 2023-06-26 NOTE — Patient Instructions (Addendum)
Continue Trelegy 1 puff daily. Brush tongue and rinse mouth afterwards Continue Albuterol inhaler 2 puffs every 6 hours as needed for shortness of breath or wheezing. Notify if symptoms persist despite rescue inhaler/neb use. Continue singulair 1 tab At bedtime Continue xyzal daily Continue astelin Twice daily  Continue flonase daily  Take your oral appliance to your dentist. If they can't adjust it, return to the dentist that made the device to have them fix it for you  Asthma action plan: START rescue up to four times a day for worsening shortness of breath, wheezing and cough. If you symptoms do not improve in 24-48 hours, contact us for steroid course. If your symptoms rapidly worsen, you have trouble talking or extreme difficulty breathing, or you're not getting relief from your nebulizer/rescue, go to the emergency department.  Follow up in one year with Dr. Belia Heman. If symptoms worsen, please contact office for sooner follow up or seek emergency care.

## 2023-06-26 NOTE — Assessment & Plan Note (Signed)
Mild OSA. Has good success with oral appliance. Device needs adjusting. Encouraged to resume wearing once this is addressed. Aware of safe driving practices.

## 2023-06-30 ENCOUNTER — Telehealth: Payer: BC Managed Care – PPO | Admitting: Nurse Practitioner

## 2023-07-24 ENCOUNTER — Other Ambulatory Visit: Payer: Self-pay | Admitting: Family Medicine

## 2023-07-27 ENCOUNTER — Telehealth: Payer: Self-pay | Admitting: *Deleted

## 2023-07-27 DIAGNOSIS — E119 Type 2 diabetes mellitus without complications: Secondary | ICD-10-CM

## 2023-07-27 DIAGNOSIS — E785 Hyperlipidemia, unspecified: Secondary | ICD-10-CM

## 2023-07-27 DIAGNOSIS — Z79899 Other long term (current) drug therapy: Secondary | ICD-10-CM

## 2023-07-27 NOTE — Telephone Encounter (Signed)
-----   Message from Lovena Neighbours sent at 07/27/2023  1:53 PM EST ----- Regarding: Labs for Friday 12.20.24 Please put physical lab orders in future. Thank you, Denny Peon

## 2023-08-03 ENCOUNTER — Telehealth: Payer: Self-pay | Admitting: Family Medicine

## 2023-08-03 NOTE — Telephone Encounter (Signed)
Terri,    Can you help me with this.  Future orders are already placed in Epic.  Do we need to change anything for him to get the drawn at [facility]?

## 2023-08-03 NOTE — Telephone Encounter (Signed)
Pt called asking if lab orders for his cpe be sent to [facility]? 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 Pt requested a call back once orders have been submitted? Call back # [phone]

## 2023-08-04 ENCOUNTER — Encounter: Payer: Self-pay | Admitting: Family Medicine

## 2023-08-04 NOTE — Telephone Encounter (Signed)
Antonio Lara spoke with patient via mychart about lab orders. See mychart message.

## 2023-08-10 ENCOUNTER — Encounter: Payer: Self-pay | Admitting: Family Medicine

## 2023-08-11 ENCOUNTER — Other Ambulatory Visit: Payer: Self-pay

## 2023-08-11 ENCOUNTER — Other Ambulatory Visit: Payer: BC Managed Care – PPO

## 2023-08-11 DIAGNOSIS — E785 Hyperlipidemia, unspecified: Secondary | ICD-10-CM

## 2023-08-11 DIAGNOSIS — E119 Type 2 diabetes mellitus without complications: Secondary | ICD-10-CM

## 2023-08-11 DIAGNOSIS — Z79899 Other long term (current) drug therapy: Secondary | ICD-10-CM

## 2023-08-11 NOTE — Progress Notes (Unsigned)
Routed this message to Dr. Patsy Lager to ensure they fax over orders needed for patient.  Previous future orders were entered, but were not [facility].   I have given them fax number and office number.

## 2023-08-12 LAB — MICROALBUMIN / CREATININE URINE RATIO
Creatinine, Urine: 441.5 mg/dL
Microalb/Creat Ratio: 3 mg/g{creat} (ref 0–29)
Microalbumin, Urine: 12.1 ug/mL

## 2023-08-12 LAB — LIPID PANEL

## 2023-08-12 NOTE — Progress Notes (Signed)
It looks all of his labs were drawn yesterday.

## 2023-08-13 LAB — BASIC METABOLIC PANEL
BUN/Creatinine Ratio: 12 (ref 9–20)
BUN: 12 mg/dL (ref 6–24)
CO2: 27 mmol/L (ref 20–29)
Calcium: 9.6 mg/dL (ref 8.7–10.2)
Chloride: 103 mmol/L (ref 96–106)
Creatinine, Ser: 1.03 mg/dL (ref 0.76–1.27)
Glucose: 125 mg/dL — ABNORMAL HIGH (ref 70–99)
Potassium: 3.9 mmol/L (ref 3.5–5.2)
Sodium: 145 mmol/L — ABNORMAL HIGH (ref 134–144)
eGFR: 91 mL/min/{1.73_m2} (ref >59)

## 2023-08-13 LAB — HEPATIC FUNCTION PANEL
ALT: 28 IU/L (ref 0–44)
AST: 22 IU/L (ref 0–40)
Albumin: 4.6 g/dL (ref 4.1–5.1)
Alkaline Phosphatase: 86 IU/L (ref 44–121)
Bilirubin Total: 0.2 mg/dL (ref 0.0–1.2)
Bilirubin, Direct: 0.1 mg/dL (ref 0.00–0.40)
Total Protein: 6.6 g/dL (ref 6.0–8.5)

## 2023-08-13 LAB — LIPID PANEL
Cholesterol, Total: 154 mg/dL (ref 100–199)
HDL: 48 mg/dL (ref >39)
LDL CALC COMMENT:: 3.2 ratio (ref 0.0–5.0)
LDL Chol Calc (NIH): 86 mg/dL (ref 0–99)
Triglycerides: 113 mg/dL (ref 0–149)
VLDL Cholesterol Cal: 20 mg/dL (ref 5–40)

## 2023-08-13 LAB — CBC WITH DIFFERENTIAL/PLATELET
Basophils Absolute: 0 10*3/uL (ref 0.0–0.2)
Basos: 1 %
EOS (ABSOLUTE): 0.1 10*3/uL (ref 0.0–0.4)
Eos: 2 %
Hematocrit: 43.3 % (ref 37.5–51.0)
Hemoglobin: 13.8 g/dL (ref 13.0–17.7)
Immature Grans (Abs): 0 10*3/uL (ref 0.0–0.1)
Immature Granulocytes: 0 %
Lymphocytes Absolute: 1.7 10*3/uL (ref 0.7–3.1)
Lymphs: 29 %
MCH: 28 pg (ref 26.6–33.0)
MCHC: 31.9 g/dL (ref 31.5–35.7)
MCV: 88 fL (ref 79–97)
Monocytes Absolute: 0.5 10*3/uL (ref 0.1–0.9)
Monocytes: 8 %
Neutrophils Absolute: 3.6 10*3/uL (ref 1.4–7.0)
Neutrophils: 60 %
Platelets: 262 10*3/uL (ref 150–450)
RBC: 4.92 x10E6/uL (ref 4.14–5.80)
RDW: 12.8 % (ref 11.6–15.4)
WBC: 5.9 10*3/uL (ref 3.4–10.8)

## 2023-08-13 LAB — HEMOGLOBIN A1C
Est. average glucose Bld gHb Est-mCnc: 140 mg/dL
Hgb A1c MFr Bld: 6.5 % — ABNORMAL HIGH (ref 4.8–5.6)

## 2023-08-21 ENCOUNTER — Encounter: Payer: BC Managed Care – PPO | Admitting: Family Medicine

## 2023-09-07 NOTE — Telephone Encounter (Signed)
I just spoke to him.  Recent labs and no need to redraw any labs.

## 2023-09-08 ENCOUNTER — Other Ambulatory Visit: Payer: Self-pay

## 2023-09-17 NOTE — Progress Notes (Unsigned)
Ashyia Schraeder T. Jay Haskew, MD, CAQ Sports Medicine [facility] [address] [place], [address]  Phone: [phone]  FAX: [phone]  JONTEZ REDFIELD - 48 y.o. male  MRN [id number]  Date of Birth: 1975-09-22  Date: 09/18/2023  PCP: Hannah Beat, MD  Referral: Hannah Beat, MD  No chief complaint on file.  Patient Care Team: Hannah Beat, MD as PCP - General Subjective:   Lionel December is a 48 y.o. pleasant patient who presents with the following:  Preventative Health Maintenance Visit:  Health Maintenance Summary Reviewed and updated, unless pt declines services.  Tobacco History Reviewed. Alcohol: No concerns, no excessive use Exercise Habits: Some activity, rec at least 30 mins 5 times a week STD concerns: no risk or activity to increase risk Drug Use: None  Vivien Rossetti is a very well-known patient who is here for his routine physical.  He also does have diabetes and has been very well-controlled.  Prevnar-20??? Covid booster Colonoscopy Flu Foot Eye exam  Diabetes Mellitus: Tolerating Medications: yes Compliance with diet: fair, There is no height or weight on file to calculate BMI. Exercise: minimal / intermittent Avg blood sugars at home: not checking Foot problems: none Hypoglycemia: none No nausea, vomitting, blurred vision, polyuria.  Lab Results  Component Value Date   HGBA1C 6.5 (H) 08/11/2023   HGBA1C 6.3 05/04/2022   HGBA1C 5.6 06/10/2021   Lab Results  Component Value Date   MICROALBUR 1.9 05/04/2022   LDLCALC 86 08/11/2023   CREATININE 1.03 08/11/2023    Wt Readings from Last 3 Encounters:  12/27/22 192 lb 3.2 oz (87.2 kg)  11/18/22 194 lb (88 kg)  11/15/22 195 lb 2 oz (88.5 kg)     Health Maintenance  Topic Date Due   Pneumococcal Vaccine 3-42 Years old (2 of 2 - PCV) 07/08/2016   COVID-19 Vaccine (2 - Janssen risk series) 01/29/2020   Colonoscopy  Never done   INFLUENZA VACCINE   03/23/2023   FOOT EXAM  05/12/2023   OPHTHALMOLOGY EXAM  06/09/2023   HEMOGLOBIN A1C  02/09/2024   Diabetic kidney evaluation - eGFR measurement  08/10/2024   Diabetic kidney evaluation - Urine ACR  08/10/2024   DTaP/Tdap/Td (4 - Td or Tdap) 07/14/2029   Hepatitis C Screening  Completed   HIV Screening  Completed   HPV VACCINES  Aged Out   Immunization History  Administered Date(s) Administered   Influenza,inj,Quad PF,6+ Mos 07/09/2015, 05/29/2019   Influenza-Unspecified 05/13/2020, 06/08/2021, 06/08/2021   Janssen (J&J) SARS-COV-2 Vaccination 01/01/2020   Pneumococcal Polysaccharide-23 07/09/2015   Td 08/22/1996, 03/31/2009   Tdap 07/15/2019   Patient Active Problem List   Diagnosis Date Noted   Diabetes mellitus type 2, diet-controlled (HCC) 03/04/2021    Priority: High   Moderate persistent allergic asthma 05/06/2022    Priority: Medium    Hyperlipidemia LDL goal <70 03/05/2021    Priority: Medium    Hypertension, essential 10/19/2020    Priority: Medium    Mild obstructive sleep apnea 06/26/2023   Allergic rhinitis due to animal (cat) (dog) hair and dander 05/06/2022   Chronic allergic conjunctivitis 05/06/2022   GERD 07/10/2009    Past Medical History:  Diagnosis Date   Diabetes mellitus type 2, diet-controlled (HCC) 03/04/2021   GERD (gastroesophageal reflux disease)    Hyperlipidemia LDL goal <70 03/05/2021   Mild obstructive sleep apnea, controlled with dental appliance    Moderate persistent asthma    Sleep apnea     Past  Surgical History:  Procedure Laterality Date   ESOPHAGOGASTRODUODENOSCOPY (EGD) WITH PROPOFOL N/A 11/18/2022   Procedure: ESOPHAGOGASTRODUODENOSCOPY (EGD) WITH PROPOFOL;  Surgeon: Wyline Mood, MD;  Location: [facility] END[facility]COPY;  Service: Gastroenterology;  Laterality: N/A;    Family History  Problem Relation Age of Onset   Breast cancer Mother    Stroke Father    Cancer Maternal Grandfather     Social History   Social History  Narrative   Drinks one caffeinated beverage a day.    Past Medical History, Surgical History, Social History, Family History, Problem List, Medications, and Allergies have been reviewed and updated if relevant.  Review of Systems: Pertinent positives are listed above.  Otherwise, a full 14 point review of systems has been done in full and it is negative except where it is noted positive.  Objective:   There were no vitals taken for this visit. Ideal Body Weight:    Ideal Body Weight:   No results found.    05/11/2022    8:29 AM 03/04/2021    2:09 PM 07/15/2019    9:13 AM  Depression screen PHQ 2/9  Decreased Interest 0 0 0  Down, Depressed, Hopeless 0 0 0  PHQ - 2 Score 0 0 0     GEN: well developed, well nourished, no acute distress Eyes: conjunctiva and lids normal, PERRLA, EOMI ENT: TM clear, nares clear, oral exam WNL Neck: supple, no lymphadenopathy, no thyromegaly, no JVD Pulm: clear to auscultation and percussion, respiratory effort normal CV: regular rate and rhythm, S1-S2, no murmur, rub or gallop, no bruits, peripheral pulses normal and symmetric, no cyanosis, clubbing, edema or varicosities GI: soft, non-tender; no hepatosplenomegaly, masses; active bowel sounds all quadrants GU: deferred Lymph: no cervical, axillary or inguinal adenopathy MSK: gait normal, muscle tone and strength WNL, no joint swelling, effusions, discoloration, crepitus  SKIN: clear, good turgor, color WNL, no rashes, lesions, or ulcerations Neuro: normal mental status, normal strength, sensation, and motion Psych: alert; oriented to person, place and time, normally interactive and not anxious or depressed in appearance.  All labs reviewed with patient. Results for orders placed or performed in visit on 08/11/23  Basic metabolic panel   Collection Time: 08/11/23  8:11 AM  Result Value Ref Range   Glucose 125 (H) 70 - 99 mg/dL   BUN 12 6 - 24 mg/dL   Creatinine, Ser 1.61 0.76 - 1.27 mg/dL    eGFR 91 >09 UE/AVW/0.98   BUN/Creatinine Ratio 12 9 - 20   Sodium 145 (H) 134 - 144 mmol/L   Potassium 3.9 3.5 - 5.2 mmol/L   Chloride 103 96 - 106 mmol/L   CO2 27 20 - 29 mmol/L   Calcium 9.6 8.7 - 10.2 mg/dL  CBC with Differential/Platelet   Collection Time: 08/11/23  8:11 AM  Result Value Ref Range   WBC 5.9 3.4 - 10.8 x10E3/uL   RBC 4.92 4.14 - 5.80 x10E6/uL   Hemoglobin 13.8 13.0 - 17.7 g/dL   Hematocrit 11.9 14.7 - 51.0 %   MCV 88 79 - 97 fL   MCH 28.0 26.6 - 33.0 pg   MCHC 31.9 31.5 - 35.7 g/dL   RDW 82.9 56.2 - 13.0 %   Platelets 262 150 - 450 x10E3/uL   Neutrophils 60 Not Estab. %   Lymphs 29 Not Estab. %   Monocytes 8 Not Estab. %   Eos 2 Not Estab. %   Basos 1 Not Estab. %   Neutrophils Absolute 3.6 1.4 - 7.0  x10E3/uL   Lymphocytes Absolute 1.7 0.7 - 3.1 x10E3/uL   Monocytes Absolute 0.5 0.1 - 0.9 x10E3/uL   EOS (ABSOLUTE) 0.1 0.0 - 0.4 x10E3/uL   Basophils Absolute 0.0 0.0 - 0.2 x10E3/uL   Immature Granulocytes 0 Not Estab. %   Immature Grans (Abs) 0.0 0.0 - 0.1 x10E3/uL  Hepatic function panel   Collection Time: 08/11/23  8:11 AM  Result Value Ref Range   Total Protein 6.6 6.0 - 8.5 g/dL   Albumin 4.6 4.1 - 5.1 g/dL   Bilirubin Total 0.2 0.0 - 1.2 mg/dL   Bilirubin, Direct 1.61 0.00 - 0.40 mg/dL   Alkaline Phosphatase 86 44 - 121 IU/L   AST 22 0 - 40 IU/L   ALT 28 0 - 44 IU/L  Lipid panel   Collection Time: 08/11/23  8:11 AM  Result Value Ref Range   Cholesterol, Total 154 100 - 199 mg/dL   Triglycerides 096 0 - 149 mg/dL   HDL 48 >04 mg/dL   VLDL Cholesterol Cal 20 5 - 40 mg/dL   LDL Chol Calc (NIH) 86 0 - 99 mg/dL   Chol/HDL Ratio 3.2 0.0 - 5.0 ratio  Hemoglobin A1c   Collection Time: 08/11/23  8:11 AM  Result Value Ref Range   Hgb A1c MFr Bld 6.5 (H) 4.8 - 5.6 %   Est. average glucose Bld gHb Est-mCnc 140 mg/dL  Urine Microalbumin w/creat. ratio   Collection Time: 08/11/23  8:13 AM  Result Value Ref Range   Creatinine, Urine 441.5 Not Estab.  mg/dL   Microalbumin, Urine 54.0 Not Estab. ug/mL   Microalb/Creat Ratio 3 0 - 29 mg/g creat    Assessment and Plan:     ICD-10-CM   1. Healthcare maintenance  Z00.00       Health Maintenance Exam: The patient's preventative maintenance and recommended screening tests for an annual wellness exam were reviewed in full today. Brought up to date unless services declined.  Counselled on the importance of diet, exercise, and its role in overall health and mortality. The patient's FH and SH was reviewed, including their home life, tobacco status, and drug and alcohol status.  Follow-up in 1 year for physical exam or additional follow-up below.  Disposition: No follow-ups on file.  No orders of the defined types were placed in this encounter.  There are no discontinued medications. No orders of the defined types were placed in this encounter.   Signed,  Elpidio Galea. Sherion Dooly, MD   Allergies as of 09/18/2023       Reactions   Oxycodone-acetaminophen    REACTION: extemely dizzy with nausea and vomiting.   Zanaflex [tizanidine Hcl] Other (See Comments)   Felt unusual and had some vivid visuals        Medication List        Accurate as of September 17, 2023  1:50 PM. If you have any questions, ask your nurse or doctor.          azelastine 0.05 % ophthalmic solution Commonly known as: OPTIVAR 1 drop into affected eye   Azelastine HCl 137 MCG/SPRAY Soln 1 puff in each nostril   esomeprazole 20 MG capsule Commonly known as: NEXIUM TAKE 1 CAPSULE BY MOUTH DAILY   fluticasone 50 MCG/ACT nasal spray Commonly known as: FLONASE Place into both nostrils as needed.   levocetirizine 5 MG tablet Commonly known as: XYZAL Take 5 mg by mouth every evening.   lisinopril-hydrochlorothiazide 20-12.5 MG tablet Commonly known as: ZESTORETIC TAKE ONE  TABLET BY MOUTH ONCE A DAY   montelukast 10 MG tablet Commonly known as: SINGULAIR TAKE ONE TABLET BY MOUTH EVERY NIGHT AT  BEDTIME   multivitamin tablet Take 1 tablet by mouth daily.   rosuvastatin 10 MG tablet Commonly known as: CRESTOR TAKE ONE TABLET BY MOUTH ONCE A DAY   Trelegy Ellipta 200-62.5-25 MCG/ACT Aepb Generic drug: Fluticasone-Umeclidin-Vilant Inhale 1 puff into the lungs daily.   Ventolin HFA 108 (90 Base) MCG/ACT inhaler Generic drug: albuterol INHALE 2 PUFFS INTO THE LUNGS EVERY 6 HOURS AS NEEDED FOR WHEEZING OR SHORTNESS OF BREATH

## 2023-09-18 ENCOUNTER — Ambulatory Visit (INDEPENDENT_AMBULATORY_CARE_PROVIDER_SITE_OTHER): Payer: BC Managed Care – PPO | Admitting: Family Medicine

## 2023-09-18 ENCOUNTER — Other Ambulatory Visit: Payer: Self-pay | Admitting: Family Medicine

## 2023-09-18 VITALS — BP 100/74 | HR 84 | Temp 97.4°F | Ht 72.0 in | Wt 191.2 lb

## 2023-09-18 DIAGNOSIS — Z0000 Encounter for general adult medical examination without abnormal findings: Secondary | ICD-10-CM | POA: Diagnosis not present

## 2023-09-18 DIAGNOSIS — Z23 Encounter for immunization: Secondary | ICD-10-CM | POA: Diagnosis not present

## 2023-09-18 DIAGNOSIS — Z1211 Encounter for screening for malignant neoplasm of colon: Secondary | ICD-10-CM

## 2023-09-18 MED ORDER — LISINOPRIL 20 MG PO TABS: 20.0000 mg | ORAL_TABLET | Freq: Every day | ORAL | 3 refills | Status: DC

## 2023-09-19 ENCOUNTER — Encounter: Payer: Self-pay | Admitting: Family Medicine

## 2023-09-20 ENCOUNTER — Other Ambulatory Visit: Payer: Self-pay

## 2023-09-20 ENCOUNTER — Telehealth: Payer: Self-pay

## 2023-09-20 DIAGNOSIS — Z1211 Encounter for screening for malignant neoplasm of colon: Secondary | ICD-10-CM

## 2023-09-20 NOTE — Telephone Encounter (Signed)
Gastroenterology Pre-Procedure Review  Request Date: 10/11/23 Requesting Physician: Dr. Tobi Bastos  PATIENT REVIEW QUESTIONS: The patient responded to the following health history questions as indicated:    1. Are you having any GI issues? no 2. Do you have a personal history of Polyps? no 3. Do you have a family history of Colon Cancer or Polyps? no 4. Diabetes Mellitus? yes (yes controlled with diet) 5. Joint replacements in the past 12 months?no 6. Major health problems in the past 3 months?no 7. Any artificial heart valves, MVP, or defibrillator?no    MEDICATIONS & ALLERGIES:    Patient reports the following regarding taking any anticoagulation/antiplatelet therapy:   Plavix, Coumadin, Eliquis, Xarelto, Lovenox, Pradaxa, Brilinta, or Effient? no Aspirin? no  Patient confirms/reports the following medications:  Current Outpatient Medications  Medication Sig Dispense Refill   azelastine (OPTIVAR) 0.05 % ophthalmic solution 1 drop into affected eye     Azelastine HCl 137 MCG/SPRAY SOLN 1 puff in each nostril     esomeprazole (NEXIUM) 20 MG capsule TAKE 1 CAPSULE BY MOUTH DAILY 90 capsule 3   fluticasone (FLONASE) 50 MCG/ACT nasal spray Place into both nostrils as needed.     Fluticasone-Umeclidin-Vilant (TRELEGY ELLIPTA) 200-62.5-25 MCG/ACT AEPB Inhale 1 puff into the lungs daily.     levocetirizine (XYZAL) 5 MG tablet Take 5 mg by mouth every evening.     lisinopril (ZESTRIL) 20 MG tablet Take 1 tablet (20 mg total) by mouth daily. 90 tablet 3   montelukast (SINGULAIR) 10 MG tablet TAKE ONE TABLET BY MOUTH EVERY NIGHT AT BEDTIME 30 tablet 5   Multiple Vitamin (MULTIVITAMIN) tablet Take 1 tablet by mouth daily.     rosuvastatin (CRESTOR) 10 MG tablet TAKE ONE TABLET BY MOUTH ONCE A DAY 90 tablet 0   VENTOLIN HFA 108 (90 Base) MCG/ACT inhaler INHALE 2 PUFFS INTO THE LUNGS EVERY 6 HOURS AS NEEDED FOR WHEEZING OR SHORTNESS OF BREATH 18 g 2   No current facility-administered medications for  this visit.    Patient confirms/reports the following allergies:  Allergies  Allergen Reactions   Oxycodone-Acetaminophen     REACTION: extemely dizzy with nausea and vomiting.   Zanaflex [Tizanidine Hcl] Other (See Comments)    Felt unusual and had some vivid visuals    No orders of the defined types were placed in this encounter.   AUTHORIZATION INFORMATION Primary Insurance: 1D#: Group #:  Secondary Insurance: 1D#: Group #:  SCHEDULE INFORMATION: Date: 10/11/23 Time: Location: [facility]

## 2023-09-28 ENCOUNTER — Other Ambulatory Visit: Payer: Self-pay

## 2023-09-28 MED ORDER — NA SULFATE-K SULFATE-MG SULF 17.5-3.13-1.6 GM/177ML PO SOLN: 1.0000 | Freq: Once | ORAL | 0 refills | Status: AC

## 2023-10-03 ENCOUNTER — Other Ambulatory Visit: Payer: Self-pay

## 2023-10-03 ENCOUNTER — Ambulatory Visit (INDEPENDENT_AMBULATORY_CARE_PROVIDER_SITE_OTHER): Payer: Self-pay | Admitting: Oncology

## 2023-10-03 ENCOUNTER — Encounter: Payer: Self-pay | Admitting: Oncology

## 2023-10-03 VITALS — BP 118/86 | HR 90 | Temp 97.8°F | Ht 72.0 in | Wt 189.0 lb

## 2023-10-03 DIAGNOSIS — J40 Bronchitis, not specified as acute or chronic: Secondary | ICD-10-CM

## 2023-10-03 LAB — POC SOFIA 2 FLU + SARS ANTIGEN FIA
Influenza A, POC: NEGATIVE
Influenza B, POC: NEGATIVE
SARS Coronavirus 2 Ag: NEGATIVE

## 2023-10-03 MED ORDER — PREDNISONE 10 MG (21) PO TBPK: ORAL_TABLET | ORAL | 0 refills | Status: DC

## 2023-10-03 MED ORDER — AZITHROMYCIN 250 MG PO TABS: ORAL_TABLET | ORAL | 0 refills | Status: AC

## 2023-10-03 NOTE — Progress Notes (Signed)
[facility] [address] [place], [place] [address] Phone: [phone] Fax: [phone]   Office Visit Note  Patient Name: Antonio Lara  Date of [id number]  Med Rec number [id number]  Date of Service: 10/03/2023  Oxycodone-acetaminophen and Zanaflex [tizanidine hcl]  Chief Complaint  Patient presents with   Sick visit    Patient c/o diarrhea, hoarseness, nasal congestion (worse on the R side), pressure/fullness in both ears, and dry cough. Symptoms began about 5 days ago. His wife and son were both sick as well recently. He has been taking Tylenol Cold and Flu. He also took Mucinex but he felt like it was causing too much dryness in his throat.    HPI Patient is an 48 y.o. student here for complaints of flu like sx that started 5 days ago. [place], [place], cough with mild body aches. Occasional HA. No fevers.  Reports his wife and children had similar symptoms although at different times.    Has tried albuterol inhaler, mucinex DM and tylenol cold/flu intermittently.   Has hx of asthma/COPD.  Would like to have flu and COVID testing.  Current Medication:  Outpatient Encounter Medications as of 10/03/2023  Medication Sig   azelastine (OPTIVAR) 0.05 % ophthalmic solution Place into both eyes as needed.   Azelastine HCl 137 MCG/SPRAY SOLN daily as needed.   esomeprazole (NEXIUM) 20 MG capsule TAKE 1 CAPSULE BY MOUTH DAILY   fluticasone (FLONASE) 50 MCG/ACT nasal spray Place into both nostrils as needed.   Fluticasone-Umeclidin-Vilant (TRELEGY ELLIPTA) 200-62.5-25 MCG/ACT AEPB Inhale 1 puff into the lungs daily.   levocetirizine (XYZAL) 5 MG tablet Take 5 mg by mouth every evening.   lisinopril (ZESTRIL) 20 MG tablet Take 1 tablet (20 mg total) by mouth daily.   montelukast (SINGULAIR) 10 MG tablet TAKE ONE TABLET BY MOUTH EVERY NIGHT AT BEDTIME   Multiple Vitamin (MULTIVITAMIN) tablet Take 1 tablet by mouth daily.   rosuvastatin (CRESTOR) 10 MG tablet TAKE ONE TABLET BY  MOUTH ONCE A DAY   VENTOLIN HFA 108 (90 Base) MCG/ACT inhaler INHALE 2 PUFFS INTO THE LUNGS EVERY 6 HOURS AS NEEDED FOR WHEEZING OR SHORTNESS OF BREATH   No facility-administered encounter medications on file as of 10/03/2023.   Medical History: Past Medical History:  Diagnosis Date   Diabetes mellitus type 2, diet-controlled (HCC) 03/04/2021   GERD (gastroesophageal reflux disease)    Hyperlipidemia LDL goal <70 03/05/2021   Mild obstructive sleep apnea, controlled with dental appliance    Moderate persistent asthma    Sleep apnea     Vital Signs: BP 118/86   Pulse 90   Temp 97.8 F (36.6 C)   Ht 6' (1.829 m)   Wt 189 lb (85.7 kg)   SpO2 98%   BMI 25.63 kg/m   ROS: As per HPI.  All other pertinent ROS negative.     Review of Systems  Constitutional:  Positive for chills, diaphoresis and fatigue. Negative for fever.  HENT:  Positive for congestion, postnasal drip, sinus pressure, sinus pain and sore throat. Negative for ear pain.   Respiratory:  Positive for cough. Negative for shortness of breath.   Gastrointestinal:  Negative for constipation, diarrhea, nausea and vomiting.  Musculoskeletal:  Positive for myalgias.  Neurological:  Positive for headaches.    Physical Exam Constitutional:      Appearance: Normal appearance.  HENT:     Right Ear: A middle ear effusion is present.     Left Ear: A middle ear  effusion is present.     Nose: Congestion and rhinorrhea present.     Right Turbinates: Not swollen.     Left Turbinates: Not swollen.     Right Sinus: No maxillary sinus tenderness or frontal sinus tenderness.     Left Sinus: No maxillary sinus tenderness or frontal sinus tenderness.     Mouth/Throat:     Lips: Pink.     Mouth: Mucous membranes are moist.     Pharynx: Posterior oropharyngeal erythema and postnasal drip present.     Tonsils: No tonsillar exudate. 0 on the right. 0 on the left.  Pulmonary:     Effort: Pulmonary effort is normal. Prolonged  expiration present.     Breath sounds: Normal breath sounds. No decreased breath sounds, wheezing, rhonchi or rales.  Lymphadenopathy:     Cervical: No cervical adenopathy.  Neurological:     Mental Status: He is alert.     No results found for this or any previous visit (from the past 24 hours).  Assessment/Plan: 1. Bronchitis (Primary) Flu and COVID were negative.  Exam is concerning for bronchitis.  We discussed prednisone and azithromycin to help with symptoms.  Recommend prednisone taper take 6 tabs on day 1 followed by 5 tablets on day 2 and decrease by 1 tablet until complete along with azithromycin 250 mg tablets.  Take 2 tabs on day 1 followed by 1 tablet until complete.  Continue over-the-counter medications per our discussion and may stop as symptoms improve.  Continue antibiotics and steroids until they are complete.  Please let me know if your symptoms are not improving over the next 3 to 4 days and we can get a chest x-ray.  - POC SOFIA 2 FLU + SARS ANTIGEN FIA - predniSONE (STERAPRED UNI-PAK 21 TAB) 10 MG (21) TBPK tablet; Take 6 tabs on day 1, 5 tabs on day 2 and decrease by 1 each day until done.  Dispense: 21 tablet; Refill: 0 - azithromycin (ZITHROMAX) 250 MG tablet; Take 2 tablets on day 1, then 1 tablet daily on days 2 through 5  Dispense: 6 tablet; Refill: 0   General Counseling: Teaghan verbalizes understanding of the findings of todays visit and agrees with plan of treatment. I have discussed any further diagnostic evaluation that may be needed or ordered today. We also reviewed his medications today. he has been encouraged to call the office with any questions or concerns that should arise related to todays visit.   No orders of the defined types were placed in this encounter.   No orders of the defined types were placed in this encounter.   I spent 20 minutes dedicated to the care of this patient (face-to-face and non-face-to-face) on the date of the encounter  to include what is described in the assessment and plan.   Durenda Hurt, NP 10/03/2023 10:41 AM

## 2023-10-05 ENCOUNTER — Other Ambulatory Visit: Payer: Self-pay | Admitting: Family Medicine

## 2023-10-11 ENCOUNTER — Encounter
Admission: RE | Disposition: A | Discharge status: Home / Self Care | Payer: Self-pay | Source: Home / Self Care | Attending: Gastroenterology

## 2023-10-11 ENCOUNTER — Ambulatory Visit
Admission: RE | Admit: 2023-10-11 | Discharge: 2023-10-11 | Disposition: A | Discharge status: Home / Self Care | Payer: BC Managed Care – PPO | Attending: Gastroenterology | Admitting: Gastroenterology

## 2023-10-11 ENCOUNTER — Ambulatory Visit: Payer: BC Managed Care – PPO | Admitting: Registered Nurse

## 2023-10-11 DIAGNOSIS — J4540 Moderate persistent asthma, uncomplicated: Secondary | ICD-10-CM | POA: Insufficient documentation

## 2023-10-11 DIAGNOSIS — K621 Rectal polyp: Secondary | ICD-10-CM | POA: Diagnosis not present

## 2023-10-11 DIAGNOSIS — I10 Essential (primary) hypertension: Secondary | ICD-10-CM | POA: Insufficient documentation

## 2023-10-11 DIAGNOSIS — Z79899 Other long term (current) drug therapy: Secondary | ICD-10-CM | POA: Diagnosis not present

## 2023-10-11 DIAGNOSIS — Z1211 Encounter for screening for malignant neoplasm of colon: Secondary | ICD-10-CM | POA: Diagnosis not present

## 2023-10-11 DIAGNOSIS — K635 Polyp of colon: Secondary | ICD-10-CM

## 2023-10-11 DIAGNOSIS — G4733 Obstructive sleep apnea (adult) (pediatric): Secondary | ICD-10-CM | POA: Diagnosis not present

## 2023-10-11 DIAGNOSIS — J45909 Unspecified asthma, uncomplicated: Secondary | ICD-10-CM | POA: Diagnosis not present

## 2023-10-11 DIAGNOSIS — E119 Type 2 diabetes mellitus without complications: Secondary | ICD-10-CM | POA: Diagnosis not present

## 2023-10-11 DIAGNOSIS — K219 Gastro-esophageal reflux disease without esophagitis: Secondary | ICD-10-CM | POA: Diagnosis not present

## 2023-10-11 DIAGNOSIS — D125 Benign neoplasm of sigmoid colon: Secondary | ICD-10-CM | POA: Insufficient documentation

## 2023-10-11 DIAGNOSIS — D126 Benign neoplasm of colon, unspecified: Secondary | ICD-10-CM

## 2023-10-11 HISTORY — PX: COLONOSCOPY WITH PROPOFOL: SHX5780

## 2023-10-11 HISTORY — PX: POLYPECTOMY: SHX5525

## 2023-10-11 SURGERY — COLONOSCOPY WITH PROPOFOL
Anesthesia: General

## 2023-10-11 MED ORDER — DEXMEDETOMIDINE HCL IN NACL 80 MCG/20ML IV SOLN
INTRAVENOUS | Status: DC | PRN
Start: 2023-10-11 — End: 2023-10-11
  Administered 2023-10-11: 8 ug via INTRAVENOUS

## 2023-10-11 MED ORDER — PROPOFOL 1000 MG/100ML IV EMUL
INTRAVENOUS | Status: AC
  Filled 2023-10-11: qty 100

## 2023-10-11 MED ORDER — LIDOCAINE HCL (PF) 2 % IJ SOLN
INTRAMUSCULAR | Status: AC
  Filled 2023-10-11: qty 5

## 2023-10-11 MED ORDER — LIDOCAINE HCL (CARDIAC) PF 100 MG/5ML IV SOSY
PREFILLED_SYRINGE | INTRAVENOUS | Status: DC | PRN
  Administered 2023-10-11: 40 mg via INTRAVENOUS

## 2023-10-11 MED ORDER — PROPOFOL 500 MG/50ML IV EMUL
INTRAVENOUS | Status: DC | PRN
  Administered 2023-10-11: 10 ug/kg/min via INTRAVENOUS

## 2023-10-11 MED ORDER — SODIUM CHLORIDE 0.9 % IV SOLN
INTRAVENOUS | Status: DC
  Administered 2023-10-11: 40 mL/h via INTRAVENOUS

## 2023-10-11 MED ORDER — PROPOFOL 10 MG/ML IV BOLUS
INTRAVENOUS | Status: DC | PRN
  Administered 2023-10-11: 100 mg via INTRAVENOUS

## 2023-10-11 NOTE — Anesthesia Procedure Notes (Signed)
Date/Time: 10/11/2023 9:52 AM  Performed by: Stormy Fabian, CRNAPre-anesthesia Checklist: Patient identified, Emergency Drugs available, Suction available and Patient being monitored Patient Re-evaluated:Patient Re-evaluated prior to induction Oxygen Delivery Method: Nasal cannula Induction Type: IV induction Dental Injury: Teeth and Oropharynx as per pre-operative assessment  Comments: Nasal cannula with etCO2 monitoring

## 2023-10-11 NOTE — H&P (Signed)
Wyline Mood, MD [address], [address], [place], [place], [address] [address], [place], [place], [address] Phone: [phone]  Fax: [phone]  Primary Care Physician:  Hannah Beat, MD   Pre-Procedure History & Physical: HPI:  Antonio Lara is a 48 y.o. male is here for an colonoscopy.   Past Medical History:  Diagnosis Date   Diabetes mellitus type 2, diet-controlled (HCC) 03/04/2021   GERD (gastroesophageal reflux disease)    Hyperlipidemia LDL goal <70 03/05/2021   Mild obstructive sleep apnea, controlled with dental appliance    Moderate persistent asthma    Sleep apnea     Past Surgical History:  Procedure Laterality Date   ESOPHAGOGASTRODUODENOSCOPY (EGD) WITH PROPOFOL N/A 11/18/2022   Procedure: ESOPHAGOGASTRODUODENOSCOPY (EGD) WITH PROPOFOL;  Surgeon: Wyline Mood, MD;  Location: [facility] ENDOSCOPY;  Service: Gastroenterology;  Laterality: N/A;    Prior to Admission medications   Medication Sig Start Date End Date Taking? Authorizing Provider  azelastine (OPTIVAR) 0.05 % ophthalmic solution Place into both eyes as needed. 02/15/22  Yes [provider]  Azelastine HCl 137 MCG/SPRAY SOLN daily as needed. 02/15/22  Yes [provider]  esomeprazole (NEXIUM) 20 MG capsule TAKE ONE CAPSULE BY MOUTH DAILY 10/05/23  Yes Copland, Karleen Hampshire, MD  fluticasone (FLONASE) 50 MCG/ACT nasal spray Place into both nostrils as needed. 02/15/22  Yes [provider]  Fluticasone-Umeclidin-Vilant (TRELEGY ELLIPTA) 200-62.5-25 MCG/ACT AEPB Inhale 1 puff into the lungs daily. 02/15/22  Yes [provider]  levocetirizine (XYZAL) 5 MG tablet Take 5 mg by mouth every evening.   Yes [provider]  lisinopril (ZESTRIL) 20 MG tablet Take 1 tablet (20 mg total) by mouth daily. 09/18/23  Yes Copland, Karleen Hampshire, MD  montelukast (SINGULAIR) 10 MG tablet TAKE ONE TABLET BY MOUTH EVERY NIGHT AT BEDTIME 04/05/23  Yes Kasa, Wallis Bamberg, MD  Multiple  Vitamin (MULTIVITAMIN) tablet Take 1 tablet by mouth daily.   Yes [provider]  predniSONE (STERAPRED UNI-PAK 21 TAB) 10 MG (21) TBPK tablet Take 6 tabs on day 1, 5 tabs on day 2 and decrease by 1 each day until done. 10/03/23  Yes Burns, Renda Rolls, NP  rosuvastatin (CRESTOR) 10 MG tablet TAKE ONE TABLET BY MOUTH ONCE A DAY 07/24/23  Yes Copland, Spencer, MD  VENTOLIN HFA 108 (90 Base) MCG/ACT inhaler INHALE 2 PUFFS INTO THE LUNGS EVERY 6 HOURS AS NEEDED FOR WHEEZING OR SHORTNESS OF BREATH 08/12/22  Yes Copland, Karleen Hampshire, MD    Allergies as of 09/20/2023 - Review Complete 09/19/2023  Allergen Reaction Noted   Oxycodone-acetaminophen  01/02/2009   Zanaflex [tizanidine hcl] Other (See Comments) 08/11/2021    Family History  Problem Relation Age of Onset   Breast cancer Mother    Stroke Father    Cancer Maternal Grandfather     Social History   Socioeconomic History   Marital status: Married    Spouse name: Not on file   Number of children: 2   Years of education: Not on file   Highest education level: Bachelor's degree (e.g., BA, AB, BS)  Occupational History   Occupation: Investment banker, corporate: [employer]  Tobacco Use   Smoking status: Never   Smokeless tobacco: Never  Vaping Use   Vaping status: Never Used  Substance and Sexual Activity   Alcohol use: Yes    Comment: rarely   Drug use: No   Sexual activity: Not on file  Other Topics Concern   Not  on file  Social History Narrative   Drinks one caffeinated beverage a day.   Social Drivers of Corporate investment banker Strain: Low Risk  (09/14/2023)   Overall Financial Resource Strain (CARDIA)    Difficulty of Paying Living Expenses: Not hard at all  Food Insecurity: No Food Insecurity (09/14/2023)   Hunger Vital Sign    Worried About Running Out of Food in the Last Year: Never true    Ran Out of Food in the Last Year: Never true  Transportation Needs: No Transportation Needs (09/14/2023)   PRAPARE -  Administrator, Civil Service (Medical): No    Lack of Transportation (Non-Medical): No  Physical Activity: Sufficiently Active (09/14/2023)   Exercise Vital Sign    Days of Exercise per Week: 7 days    Minutes of Exercise per Session: 30 min  Stress: No Stress Concern Present (09/14/2023)   [employer] of Occupational Health - Occupational Stress Questionnaire    Feeling of Stress : Only a little  Social Connections: Moderately Integrated (09/14/2023)   Social Connection and Isolation Panel [NHANES]    Frequency of Communication with Friends and Family: Three times a week    Frequency of Social Gatherings with Friends and Family: Once a week    Attends Religious Services: More than 4 times per year    Active Member of [employer] or Organizations: No    Attends Engineer, structural: Not on file    Marital Status: Married  Catering manager Violence: Not on file    Review of Systems: See HPI, otherwise negative ROS  Physical Exam: There were no vitals taken for this visit. General:   Alert,  pleasant and cooperative in NAD Head:  Normocephalic and atraumatic. Neck:  Supple; no masses or thyromegaly. Lungs:  Clear throughout to auscultation, normal respiratory effort.    Heart:  +S1, +S2, Regular rate and rhythm, No edema. Abdomen:  Soft, nontender and nondistended. Normal bowel sounds, without guarding, and without rebound.   Neurologic:  Alert and  oriented x4;  grossly normal neurologically.  Impression/Plan: Antonio Lara is here for an colonoscopy to be performed for Screening colonoscopy average risk   Risks, benefits, limitations, and alternatives regarding  colonoscopy have been reviewed with the patient.  Questions have been answered.  All parties agreeable.   Wyline Mood, MD  10/11/2023, 9:18 AM

## 2023-10-11 NOTE — Anesthesia Postprocedure Evaluation (Signed)
Anesthesia Post Note  Patient: Antonio Lara  Procedure(s) Performed: COLONOSCOPY WITH PROPOFOL POLYPECTOMY  Patient location during evaluation: PACU Anesthesia Type: General Level of consciousness: awake and awake and alert Pain management: satisfactory to patient Vital Signs Assessment: post-procedure vital signs reviewed and stable Respiratory status: spontaneous breathing Cardiovascular status: stable Anesthetic complications: no   No notable events documented.   Last Vitals:  Vitals:   10/11/23 1006 10/11/23 1016  BP: 101/61 126/83  Pulse: 85 81  Resp: 18 18  Temp: (!) 35.8 C   SpO2: 98% 100%    Last Pain:  Vitals:   10/11/23 1016  TempSrc:   PainSc: 0-No pain                 VAN STAVEREN,Clarnce Homan

## 2023-10-11 NOTE — Op Note (Signed)
[facility] Gastroenterology Patient Name: Antonio Lara Procedure Date: 10/11/2023 9:44 AM MRN: [id number] Account #: [id number] Date of Birth: 1976/06/28 Admit Type: Outpatient Age: 48 Room: [facility] ENDO ROOM 3 Gender: Male Note Status: Finalized Instrument Name: Prentice Docker [id number] Procedure:             Colonoscopy Indications:           Screening for colorectal malignant neoplasm Providers:             Wyline Mood MD, MD Medicines:             Monitored Anesthesia Care Complications:         No immediate complications. Procedure:             Pre-Anesthesia Assessment:                        - Prior to the procedure, a History and Physical was                         performed, and patient medications, allergies and                         sensitivities were reviewed. The patient's tolerance                         of previous anesthesia was reviewed.                        - The risks and benefits of the procedure and the                         sedation options and risks were discussed with the                         patient. All questions were answered and informed                         consent was obtained.                        - ASA Grade Assessment: II - A patient with mild                         systemic disease.                        After obtaining informed consent, the colonoscope was                         passed under direct vision. Throughout the procedure,                         the patient's blood pressure, pulse, and oxygen                         saturations were monitored continuously. The                         Colonoscope was introduced through the anus and  advanced to the the cecum, identified by the                         appendiceal orifice. The colonoscopy was performed                         with ease. The patient tolerated the procedure well.                         The quality of the bowel  preparation was excellent.                         The ileocecal valve, appendiceal orifice, and rectum                         were photographed. Findings:      The perianal and digital rectal examinations were normal.      Two sessile polyps were found in the rectum and sigmoid colon. The       polyps were 2 to 3 mm in size. These polyps were removed with a jumbo       cold forceps. Resection and retrieval were complete.      The exam was otherwise without abnormality on direct and retroflexion       views. Impression:            - Two 2 to 3 mm polyps in the rectum and in the                         sigmoid colon, removed with a jumbo cold forceps.                         Resected and retrieved.                        - The examination was otherwise normal on direct and                         retroflexion views. Recommendation:        - Discharge patient to home (with escort).                        - Resume previous diet.                        - Continue present medications.                        - Await pathology results.                        - Repeat colonoscopy for surveillance based on                         pathology results. Procedure Code(s):     --- Professional ---                        [phone], Colonoscopy, flexible; with biopsy, single or  multiple Diagnosis Code(s):     --- Professional ---                        Z12.11, Encounter for screening for malignant neoplasm                         of colon                        D12.8, Benign neoplasm of rectum                        D12.5, Benign neoplasm of sigmoid colon CPT copyright 2022 American Medical Association. All rights reserved. The codes documented in this report are preliminary and upon coder review may  be revised to meet current compliance requirements. Wyline Mood, MD Wyline Mood MD, MD 10/11/2023 10:04:57 AM This report has been signed electronically. Number of Addenda: 0 Note  Initiated On: 10/11/2023 9:44 AM Scope Withdrawal Time: 0 hours 8 minutes 53 seconds  Total Procedure Duration: 0 hours 10 minutes 3 seconds  Estimated Blood Loss:  Estimated blood loss: none.      [facility]

## 2023-10-11 NOTE — Transfer of Care (Signed)
Immediate Anesthesia Transfer of Care Note  Patient: Lionel December  Procedure(s) Performed: Procedure(s): COLONOSCOPY WITH PROPOFOL (N/A) POLYPECTOMY  Patient Location: PACU and Endoscopy Unit  Anesthesia Type:General  Level of Consciousness: sedated  Airway & Oxygen Therapy: Patient Spontanous Breathing and Patient connected to nasal cannula oxygen  Post-op Assessment: Report given to RN and Post -op Vital signs reviewed and stable  Post vital signs: Reviewed and stable  Last Vitals:  Vitals:   10/11/23 0918 10/11/23 1006  BP: (!) 133/102 101/61  Pulse: 93 85  Resp: 16 18  Temp: (!) 36.1 C (!) 35.8 C  SpO2: 100% 98%    Complications: No apparent anesthesia complications

## 2023-10-11 NOTE — Anesthesia Preprocedure Evaluation (Signed)
Anesthesia Evaluation  Patient identified by MRN, date of birth, ID band Patient awake    Reviewed: Allergy & Precautions, NPO status , Patient's Chart, lab work & pertinent test results  Airway Mallampati: II  TM Distance: >3 FB Neck ROM: Full    Dental  (+) Teeth Intact   Pulmonary neg pulmonary ROS, asthma , sleep apnea    Pulmonary exam normal breath sounds clear to auscultation       Cardiovascular Exercise Tolerance: Good hypertension, Pt. on medications negative cardio ROS Normal cardiovascular exam Rhythm:Regular Rate:Normal     Neuro/Psych negative neurological ROS  negative psych ROS   GI/Hepatic negative GI ROS, Neg liver ROS,GERD  ,,  Endo/Other  negative endocrine ROSdiabetes, Type 2    Renal/GU negative Renal ROS  negative genitourinary   Musculoskeletal negative musculoskeletal ROS (+)    Abdominal Normal abdominal exam  (+)   Peds negative pediatric ROS (+)  Hematology negative hematology ROS (+)   Anesthesia Other Findings Past Medical History: 03/04/2021: Diabetes mellitus type 2, diet-controlled (HCC) No date: GERD (gastroesophageal reflux disease) 03/05/2021: Hyperlipidemia LDL goal <70 No date: Mild obstructive sleep apnea, controlled with dental  appliance No date: Moderate persistent asthma No date: Sleep apnea  Past Surgical History: 11/18/2022: ESOPHAGOGASTRODUODENOSCOPY (EGD) WITH PROPOFOL; N/A     Comment:  Procedure: ESOPHAGOGASTRODUODENOSCOPY (EGD) WITH               PROPOFOL;  Surgeon: Wyline Mood, MD;  Location: [facility]               ENDOSCOPY;  Service: Gastroenterology;  Laterality: N/A;  BMI    Body Mass Index: 25.06 kg/m      Reproductive/Obstetrics negative OB ROS                             Anesthesia Physical Anesthesia Plan  ASA: 2  Anesthesia Plan: General   Post-op Pain Management:    Induction: Intravenous  PONV Risk Score and  Plan: Propofol infusion and TIVA  Airway Management Planned: Natural Airway and Nasal Cannula  Additional Equipment:   Intra-op Plan:   Post-operative Plan:   Informed Consent: I have reviewed the patients History and Physical, chart, labs and discussed the procedure including the risks, benefits and alternatives for the proposed anesthesia with the patient or authorized representative who has indicated his/her understanding and acceptance.     Dental Advisory Given  Plan Discussed with: CRNA and Surgeon  Anesthesia Plan Comments:        Anesthesia Quick Evaluation

## 2023-10-12 ENCOUNTER — Encounter: Payer: Self-pay | Admitting: Gastroenterology

## 2023-10-12 LAB — SURGICAL PATHOLOGY

## 2023-10-16 ENCOUNTER — Other Ambulatory Visit: Payer: Self-pay | Admitting: Internal Medicine

## 2023-10-23 ENCOUNTER — Other Ambulatory Visit: Payer: Self-pay | Admitting: Family Medicine

## 2023-11-16 DIAGNOSIS — J300 Vasomotor rhinitis: Secondary | ICD-10-CM | POA: Diagnosis not present

## 2023-11-16 DIAGNOSIS — J4530 Mild persistent asthma, uncomplicated: Secondary | ICD-10-CM | POA: Diagnosis not present

## 2023-11-16 DIAGNOSIS — H1045 Other chronic allergic conjunctivitis: Secondary | ICD-10-CM | POA: Diagnosis not present

## 2023-11-16 DIAGNOSIS — J3081 Allergic rhinitis due to animal (cat) (dog) hair and dander: Secondary | ICD-10-CM | POA: Diagnosis not present

## 2023-12-28 ENCOUNTER — Encounter: Payer: Self-pay | Admitting: Adult Health

## 2023-12-28 ENCOUNTER — Ambulatory Visit (INDEPENDENT_AMBULATORY_CARE_PROVIDER_SITE_OTHER): Payer: Self-pay | Admitting: Adult Health

## 2023-12-28 ENCOUNTER — Other Ambulatory Visit: Payer: Self-pay

## 2023-12-28 VITALS — BP 146/90 | HR 61 | Temp 96.1°F | Ht 72.0 in

## 2023-12-28 DIAGNOSIS — R051 Acute cough: Secondary | ICD-10-CM

## 2023-12-28 MED ORDER — DOXYCYCLINE HYCLATE 100 MG PO TABS: 100.0000 mg | ORAL_TABLET | Freq: Two times a day (BID) | ORAL | 0 refills | Status: DC

## 2023-12-28 MED ORDER — PREDNISONE 10 MG PO TABS: ORAL_TABLET | ORAL | 0 refills | Status: AC

## 2023-12-28 NOTE — Progress Notes (Signed)
 [facility] [address] [place], [place] [address]   Office Visit Note  Patient Name: Antonio Lara Date of Birth [id number]  Medical Record number [id number]  Date of Service: 12/28/2023  Chief Complaint  Patient presents with   Cough    Patient c/o nasal congestion, cough with occasional wheezing, and SOB with exertion. He also reports feeling throat and chest tightness. Denies fever. Symptoms began about one week ago and have worsened over time. He has been using his rescue inhaler frequently but does not feel it has been very helpful lately stating it has been about "20% effective".  He also takes Trelegy once daily. He has been taking Fisherman's friend lozenges which have given him short-term relief of symptoms.     Cough Associated symptoms include shortness of breath. Pertinent negatives include no chest pain, chills, fever or sore throat.   Pt is here for a sick visit. He reports a week ago he started feeling off.  He describes malaise, fatigue, congestion.  He also has some neck and shoulder soreness.  He has some chest tightness and sob at times. He is using his albuterol  inhaler, and that's not working as well anymore.  He took Mucinex  yesterday without much improvement.    Current Medication:  Outpatient Encounter Medications as of 12/28/2023  Medication Sig   azelastine (OPTIVAR) 0.05 % ophthalmic solution Place into both eyes as needed.   Azelastine HCl 137 MCG/SPRAY SOLN daily as needed.   doxycycline  (VIBRA -TABS) 100 MG tablet Take 1 tablet (100 mg total) by mouth 2 (two) times daily.   esomeprazole  (NEXIUM ) 20 MG capsule TAKE ONE CAPSULE BY MOUTH DAILY   fluticasone (FLONASE) 50 MCG/ACT nasal spray Place into both nostrils as needed.   Fluticasone-Umeclidin-Vilant (TRELEGY ELLIPTA) 200-62.5-25 MCG/ACT AEPB Inhale 1 puff into the lungs daily.   levocetirizine (XYZAL) 5 MG tablet Take 5 mg by mouth every evening.   lisinopril  (ZESTRIL ) 20 MG tablet Take 1  tablet (20 mg total) by mouth daily.   montelukast  (SINGULAIR ) 10 MG tablet TAKE ONE TABLET BY MOUTH EVERY NIGHT AT BEDTIME   Multiple Vitamin (MULTIVITAMIN) tablet Take 1 tablet by mouth daily.   predniSONE  (DELTASONE ) 10 MG tablet Take 6 tablets (60 mg total) by mouth daily with breakfast for 1 day, THEN 5 tablets (50 mg total) daily with breakfast for 1 day, THEN 4 tablets (40 mg total) daily with breakfast for 1 day, THEN 3 tablets (30 mg total) daily with breakfast for 1 day, THEN 2 tablets (20 mg total) daily with breakfast for 1 day, THEN 1 tablet (10 mg total) daily with breakfast for 1 day.   rosuvastatin  (CRESTOR ) 10 MG tablet TAKE ONE TABLET BY MOUTH ONCE A DAY   VENTOLIN  HFA 108 (90 Base) MCG/ACT inhaler INHALE 2 PUFFS INTO THE LUNGS EVERY 6 HOURS AS NEEDED FOR WHEEZING OR SHORTNESS OF BREATH   [DISCONTINUED] predniSONE  (STERAPRED UNI-PAK 21 TAB) 10 MG (21) TBPK tablet Take 6 tabs on day 1, 5 tabs on day 2 and decrease by 1 each day until done.   No facility-administered encounter medications on file as of 12/28/2023.      Medical History: Past Medical History:  Diagnosis Date   Diabetes mellitus type 2, diet-controlled (HCC) 03/04/2021   GERD (gastroesophageal reflux disease)    Hyperlipidemia LDL goal <70 03/05/2021   Mild obstructive sleep apnea, controlled with dental appliance    Moderate persistent asthma    Sleep apnea  Vital Signs: BP (!) 146/90   Pulse 61   Temp (!) 96.1 F (35.6 C)   Ht 6' (1.829 m)   SpO2 99%   BMI 25.06 kg/m    Review of Systems  Constitutional:  Positive for fatigue. Negative for chills and fever.  HENT:  Positive for congestion. Negative for sore throat.   Eyes:  Negative for pain and itching.  Respiratory:  Positive for cough, chest tightness and shortness of breath.   Cardiovascular:  Negative for chest pain.  Gastrointestinal:  Negative for diarrhea, nausea and vomiting.    Physical Exam Vitals reviewed.  Constitutional:       Appearance: Normal appearance.  HENT:     Head: Normocephalic.     Right Ear: Tympanic membrane and ear canal normal.     Left Ear: Tympanic membrane and ear canal normal.     Nose: Nose normal.     Mouth/Throat:     Mouth: Mucous membranes are moist.     Pharynx: No posterior oropharyngeal erythema.  Eyes:     Pupils: Pupils are equal, round, and reactive to light.  Cardiovascular:     Rate and Rhythm: Normal rate.  Pulmonary:     Effort: Pulmonary effort is normal.     Breath sounds: Normal breath sounds.  Lymphadenopathy:     Cervical: No cervical adenopathy.  Neurological:     Mental Status: He is alert.    Assessment/Plan: 1. Acute cough (Primary) Take prednisone  and doxy as discussed. Follow up via MyChart messenger if symptoms fail to improve or may return to clinic as needed for worsening symptoms.   - predniSONE  (DELTASONE ) 10 MG tablet; Take 6 tablets (60 mg total) by mouth daily with breakfast for 1 day, THEN 5 tablets (50 mg total) daily with breakfast for 1 day, THEN 4 tablets (40 mg total) daily with breakfast for 1 day, THEN 3 tablets (30 mg total) daily with breakfast for 1 day, THEN 2 tablets (20 mg total) daily with breakfast for 1 day, THEN 1 tablet (10 mg total) daily with breakfast for 1 day.  Dispense: 21 tablet; Refill: 0 - doxycycline  (VIBRA -TABS) 100 MG tablet; Take 1 tablet (100 mg total) by mouth 2 (two) times daily.  Dispense: 20 tablet; Refill: 0        General Counseling: [name] verbalizes understanding of the findings of todays visit and agrees with plan of treatment. I have discussed any further diagnostic evaluation that may be needed or ordered today. We also reviewed his medications today. he has been encouraged to call the office with any questions or concerns that should arise related to todays visit.   No orders of the defined types were placed in this encounter.   Meds ordered this encounter  Medications   predniSONE  (DELTASONE ) 10  MG tablet    Sig: Take 6 tablets (60 mg total) by mouth daily with breakfast for 1 day, THEN 5 tablets (50 mg total) daily with breakfast for 1 day, THEN 4 tablets (40 mg total) daily with breakfast for 1 day, THEN 3 tablets (30 mg total) daily with breakfast for 1 day, THEN 2 tablets (20 mg total) daily with breakfast for 1 day, THEN 1 tablet (10 mg total) daily with breakfast for 1 day.    Dispense:  21 tablet    Refill:  0   doxycycline  (VIBRA -TABS) 100 MG tablet    Sig: Take 1 tablet (100 mg total) by mouth 2 (two) times daily.  Dispense:  20 tablet    Refill:  0    Time spent:15 Minutes    Sheria Dills AGNP-C Nurse Practitioner

## 2024-04-25 DIAGNOSIS — H1045 Other chronic allergic conjunctivitis: Secondary | ICD-10-CM | POA: Diagnosis not present

## 2024-04-25 DIAGNOSIS — J40 Bronchitis, not specified as acute or chronic: Secondary | ICD-10-CM | POA: Diagnosis not present

## 2024-04-25 DIAGNOSIS — J4530 Mild persistent asthma, uncomplicated: Secondary | ICD-10-CM | POA: Diagnosis not present

## 2024-04-25 DIAGNOSIS — J300 Vasomotor rhinitis: Secondary | ICD-10-CM | POA: Diagnosis not present

## 2024-05-15 ENCOUNTER — Other Ambulatory Visit: Payer: Self-pay

## 2024-05-15 ENCOUNTER — Ambulatory Visit (INDEPENDENT_AMBULATORY_CARE_PROVIDER_SITE_OTHER): Payer: Self-pay | Admitting: Medical

## 2024-05-15 ENCOUNTER — Encounter: Payer: Self-pay | Admitting: Medical

## 2024-05-15 VITALS — BP 110/80 | HR 108 | Temp 97.9°F | Ht 72.0 in | Wt 183.0 lb

## 2024-05-15 DIAGNOSIS — R051 Acute cough: Secondary | ICD-10-CM

## 2024-05-15 DIAGNOSIS — R61 Generalized hyperhidrosis: Secondary | ICD-10-CM

## 2024-05-15 DIAGNOSIS — S0990XA Unspecified injury of head, initial encounter: Secondary | ICD-10-CM

## 2024-05-15 DIAGNOSIS — R197 Diarrhea, unspecified: Secondary | ICD-10-CM

## 2024-05-15 LAB — POC COVID19/FLU A&B COMBO
Covid Antigen, POC: NEGATIVE
Influenza A Antigen, POC: NEGATIVE
Influenza B Antigen, POC: NEGATIVE

## 2024-05-15 NOTE — Progress Notes (Signed)
 [facility] [address] [place], [place] [address]   Office Visit Note  Patient Name: Antonio Lara Date of Birth [id number]  Medical Record number [id number]  Date of Service: 05/15/2024  Chief Complaint  Patient presents with   Sick visit    Patient c/o diarrhea and increased sweating x 2-3 days. I feel like I am on fire. He states his voice has been hoarse for a few days. He hit his head 4 days ago and is concerned he may have had a concussion but is feeling better.      HPI 48 y.o. male presents with diarrhea, sweats and cough. Also reports he recently hit head (~4 days ago).   Chest feeling some tight along with throat tightness/discomfort for about a week. No nasal congestion/runny nose. Mild cough, usually dry, did expectorate some green mucus once. Sometimes a little SOB. Has asthma. Uses Trelegy and Singulair  daily. Used some albuterol  few days ago, did not seem to help.  Diarrhea x 2-3 days, mostly watery, sometimes a little more solid. No black or bloody stool. 5-7 bathroom trips for diarrhea yesterday. No vomiting. No abdominal pain.  No recent abx or international travel. No known contacts with diarrhea. Tends to have a bit of a sensitive stomach.  Having sweats throughout the day. No chills. Has not checked temp. Woke up having sweat through clothes about 2 days ago.  Hit top of his head on roof of his car when stood up suddenly while at work 3 days ago. Saw stars for a few seconds, no LOC. Had some nausea briefly. Right side of neck briefly a little sore. Denies HA, sensitivity to light or noise, dizziness or balance issues. States he felt a little cognitively slow that afternoon, not since then. Tried to rest that day, avoided screens. No diagnosed concussions in past but does recall some minor head traumas playing soccer/football. Has been waking up more at night.  No OTC tx for diarrhea. Drinking water and some sports drink. Has not  limited/adjusted diet in response to diarrhea.   Current Medication:  Outpatient Encounter Medications as of 05/15/2024  Medication Sig   azelastine (OPTIVAR) 0.05 % ophthalmic solution Place into both eyes as needed.   esomeprazole  (NEXIUM ) 20 MG capsule TAKE ONE CAPSULE BY MOUTH DAILY   fluticasone (FLONASE) 50 MCG/ACT nasal spray Place into both nostrils as needed.   Fluticasone-Umeclidin-Vilant (TRELEGY ELLIPTA) 200-62.5-25 MCG/ACT AEPB Inhale 1 puff into the lungs daily.   levocetirizine (XYZAL) 5 MG tablet Take 5 mg by mouth every evening.   lisinopril  (ZESTRIL ) 20 MG tablet Take 1 tablet (20 mg total) by mouth daily.   montelukast  (SINGULAIR ) 10 MG tablet TAKE ONE TABLET BY MOUTH EVERY NIGHT AT BEDTIME   Multiple Vitamin (MULTIVITAMIN) tablet Take 1 tablet by mouth daily.   rosuvastatin  (CRESTOR ) 10 MG tablet TAKE ONE TABLET BY MOUTH ONCE A DAY   VENTOLIN  HFA 108 (90 Base) MCG/ACT inhaler INHALE 2 PUFFS INTO THE LUNGS EVERY 6 HOURS AS NEEDED FOR WHEEZING OR SHORTNESS OF BREATH   [DISCONTINUED] Azelastine HCl 137 MCG/SPRAY SOLN daily as needed.   [DISCONTINUED] doxycycline  (VIBRA -TABS) 100 MG tablet Take 1 tablet (100 mg total) by mouth 2 (two) times daily.   No facility-administered encounter medications on file as of 05/15/2024.      Medical History: Past Medical History:  Diagnosis Date   Diabetes mellitus type 2, diet-controlled (HCC) 03/04/2021   GERD (gastroesophageal reflux disease)    Hyperlipidemia LDL  goal <70 03/05/2021   Mild obstructive sleep apnea, controlled with dental appliance    Moderate persistent asthma    Sleep apnea      Vital Signs: BP 110/80   Pulse (!) 108   Temp 97.9 F (36.6 C)   Ht 6' (1.829 m)   Wt 183 lb (83 kg)   SpO2 95%   BMI 24.82 kg/m    Review of Systems See HPI  Physical Exam Vitals reviewed.  Constitutional:      General: He is not in acute distress.    Appearance: He is not ill-appearing.     Comments: Tired  appearing  HENT:     Head: Normocephalic.     Comments: Slightly tender to crown of head, no bump/deformity or bruise. Skin intact.    Right Ear: Ear canal and external ear normal.     Left Ear: Ear canal and external ear normal.     Ears:     Comments: TMs dull bilaterally    Nose: Mucosal edema and congestion present. No rhinorrhea.     Mouth/Throat:     Mouth: Mucous membranes are moist. No oral lesions.     Pharynx: Uvula midline. No pharyngeal swelling or posterior oropharyngeal erythema.     Tonsils: No tonsillar exudate.  Cardiovascular:     Rate and Rhythm: Normal rate and regular rhythm.     Heart sounds: No murmur heard.    No friction rub. No gallop.  Pulmonary:     Effort: Pulmonary effort is normal.     Breath sounds: Normal breath sounds. No wheezing, rhonchi or rales.  Abdominal:     General: Bowel sounds are normal. There is no distension.     Palpations: Abdomen is soft.     Tenderness: There is generalized abdominal tenderness (mild). There is no guarding.  Musculoskeletal:     Cervical back: Neck supple. No rigidity.  Lymphadenopathy:     Cervical: No cervical adenopathy.  Neurological:     Mental Status: He is alert.     Cranial Nerves: Cranial nerves 2-12 are intact.     Coordination: Romberg sign negative. Rapid alternating movements normal.     Gait: Gait is intact. Tandem walk normal.  Psychiatric:        Attention and Perception: Attention normal.        Mood and Affect: Affect normal.        Speech: Speech normal.        Behavior: Behavior is cooperative.    Results for orders placed or performed in visit on 05/15/24 (from the past 24 hours)  POC Covid19/Flu A&B Antigen     Status: None   Collection Time: 05/15/24  3:18 PM  Result Value Ref Range   Influenza A Antigen, POC Negative Negative   Influenza B Antigen, POC Negative Negative   Covid Antigen, POC Negative Negative    Assessment/Plan: 1. Diarrhea, unspecified type (Primary) 2. Acute  cough 3. Diaphoresis Exam reassuring overall. Slightly tachycardic with O2 sat of 95%. Given symptoms and co-morbidities, will check labs (CBC w/diff and CMP) to gain more clarity on nature of infection. POC Flu/COVID-19 antigen tests negative. Discussed symptomatic and supportive treatment for diarrhea and cough. Will follow up with results when available.  - Comprehensive metabolic panel with GFR - CBC with Differential/Platelet - POC Covid19/Flu A&B Antigen  4. Minor Head Trauma Patient reports some mild concussion symptoms in 24 hours following minor head trauma 4 days ago. Symptoms now appear resolved.  No focal neuro deficits on exam. Possible he had mild concussion. Discussed importance of getting adequate rest in coming days. Discussed concussion symptoms (i.e. headaches, light/sound sensitivity, dizziness, cognitive changes, mood changes, etc) and advised to schedule follow up visit, call or send message if they occur. Advised avoiding alcohol next few days.  Patient Instructions  -Rest and stay well-hydrated.  Take frequent sips of water, sports drink or other electrolyte beverage (i.e. Pedialyte, Liquid IV).  Limit caffeine and avoid alcohol until symptoms resolve. -Eat bland diet. Avoid spicy, fried, oily or acidic (i.e. citrus, tomato, tomato sauce) foods. While you are having diarrhea, avoid dairy products (i.e. milk, butter) until your stool returns to normal. -You may take an over-the-counter antidiarrheal medicine (i.e. Imodium AD or Pepto Bismol) if needed for diarrhea.   -Take over-the-counter medicines (i.e. Robitussin, Tylenol ) to help relieve your symptoms. -Continue your inhalers and Singulair  as prescribed. -For your sore throat/cough, use cough drops/throat lozenges, gargle warm salt water and/or drink warm liquids (like tea with honey).  -You will receive a MyChart message notifying you of your lab results when they are available.  -Cal, send MyChart message to provider  in meantime as needed for new/worsening symptoms (such as fever, abdominal pain).  -Monitor yourself for symptoms of a concussion (i.e. recurrent headaches, dizziness, nausea, light/sound sensitivity, cognitive/thinking changes, difficulty concentrating, emotional changes) over the next few days. If these symptoms occur, please call, send message or schedule follow up visitt.    General Counseling: Karmello verbalizes understanding of the findings of todays visit and plan of treatment. he has been encouraged to call the office with any questions or concerns that should arise related to todays visit.    Time spent:30 Minutes    Joen Arts PA-C Physician Assistant

## 2024-05-16 ENCOUNTER — Other Ambulatory Visit: Payer: Self-pay | Admitting: Adult Health

## 2024-05-16 ENCOUNTER — Ambulatory Visit: Payer: Self-pay | Admitting: Medical

## 2024-05-16 LAB — COMPREHENSIVE METABOLIC PANEL WITH GFR
ALT: 46 IU/L — ABNORMAL HIGH (ref 0–44)
AST: 24 IU/L (ref 0–40)
Albumin: 4.7 g/dL (ref 4.1–5.1)
Alkaline Phosphatase: 106 IU/L (ref 47–123)
BUN/Creatinine Ratio: 18 (ref 9–20)
BUN: 20 mg/dL (ref 6–24)
Bilirubin Total: 0.3 mg/dL (ref 0.0–1.2)
CO2: 22 mmol/L (ref 20–29)
Calcium: 9.4 mg/dL (ref 8.7–10.2)
Chloride: 101 mmol/L (ref 96–106)
Creatinine, Ser: 1.11 mg/dL (ref 0.76–1.27)
Globulin, Total: 2.2 g/dL (ref 1.5–4.5)
Glucose: 188 mg/dL — ABNORMAL HIGH (ref 70–99)
Potassium: 4.2 mmol/L (ref 3.5–5.2)
Sodium: 140 mmol/L (ref 134–144)
Total Protein: 6.9 g/dL (ref 6.0–8.5)
eGFR: 83 mL/min/1.73 (ref >59)

## 2024-05-16 LAB — CBC WITH DIFFERENTIAL/PLATELET
Basophils Absolute: 0.1 x10E3/uL (ref 0.0–0.2)
Basos: 1 %
EOS (ABSOLUTE): 0.1 x10E3/uL (ref 0.0–0.4)
Eos: 1 %
Hematocrit: 46.8 % (ref 37.5–51.0)
Hemoglobin: 14.8 g/dL (ref 13.0–17.7)
Immature Grans (Abs): 0.1 x10E3/uL (ref 0.0–0.1)
Immature Granulocytes: 1 %
Lymphocytes Absolute: 1.9 x10E3/uL (ref 0.7–3.1)
Lymphs: 15 %
MCH: 28.7 pg (ref 26.6–33.0)
MCHC: 31.6 g/dL (ref 31.5–35.7)
MCV: 91 fL (ref 79–97)
Monocytes Absolute: 1 x10E3/uL — ABNORMAL HIGH (ref 0.1–0.9)
Monocytes: 8 %
Neutrophils Absolute: 9.6 x10E3/uL — ABNORMAL HIGH (ref 1.4–7.0)
Neutrophils: 74 %
Platelets: 270 x10E3/uL (ref 150–450)
RBC: 5.16 x10E6/uL (ref 4.14–5.80)
RDW: 12.7 % (ref 11.6–15.4)
WBC: 12.7 x10E3/uL — ABNORMAL HIGH (ref 3.4–10.8)

## 2024-05-16 MED ORDER — AZITHROMYCIN 250 MG PO TABS
ORAL_TABLET | ORAL | 0 refills | Status: AC
Start: 2024-05-16 — End: 2024-05-21

## 2024-05-20 ENCOUNTER — Encounter: Payer: Self-pay | Admitting: Medical

## 2024-05-20 NOTE — Patient Instructions (Signed)
-  Rest and stay well-hydrated.  Take frequent sips of water, sports drink or other electrolyte beverage (i.e. Pedialyte, Liquid IV).  Limit caffeine and avoid alcohol until symptoms resolve. -Eat bland diet. Avoid spicy, fried, oily or acidic (i.e. citrus, tomato, tomato sauce) foods. While you are having diarrhea, avoid dairy products (i.e. milk, butter) until your stool returns to normal. -You may take an over-the-counter antidiarrheal medicine (i.e. Imodium AD or Pepto Bismol) if needed for diarrhea.   -Take over-the-counter medicines (i.e. Robitussin, Tylenol ) to help relieve your symptoms. -Continue your inhalers and Singulair  as prescribed. -For your sore throat/cough, use cough drops/throat lozenges, gargle warm salt water and/or drink warm liquids (like tea with honey).  -You will receive a MyChart message notifying you of your lab results when they are available.  -Cal, send MyChart message to provider in meantime as needed for new/worsening symptoms (such as fever, abdominal pain).  -Monitor yourself for symptoms of a concussion (i.e. recurrent headaches, dizziness, nausea, light/sound sensitivity, cognitive/thinking changes, difficulty concentrating, emotional changes) over the next few days. If these symptoms occur, please call, send message or schedule follow up visitt.

## 2024-06-04 ENCOUNTER — Ambulatory Visit (INDEPENDENT_AMBULATORY_CARE_PROVIDER_SITE_OTHER): Payer: Self-pay | Admitting: Medical

## 2024-06-04 ENCOUNTER — Encounter: Payer: Self-pay | Admitting: Medical

## 2024-06-04 ENCOUNTER — Other Ambulatory Visit: Payer: Self-pay

## 2024-06-04 VITALS — BP 110/84 | HR 87 | Temp 97.0°F

## 2024-06-04 DIAGNOSIS — J069 Acute upper respiratory infection, unspecified: Secondary | ICD-10-CM

## 2024-06-04 DIAGNOSIS — R197 Diarrhea, unspecified: Secondary | ICD-10-CM

## 2024-06-04 LAB — POC SOFIA 2 FLU + SARS ANTIGEN FIA
Influenza A, POC: NEGATIVE
Influenza B, POC: NEGATIVE
SARS Coronavirus 2 Ag: NEGATIVE

## 2024-06-04 NOTE — Patient Instructions (Signed)
-  Rest and stay well-hydrated.  Take frequent sips of water, sports drink or other electrolyte beverage (i.e. Pedialyte, Liquid IV).  Avoid/limit caffeine. Avoid alcohol. -Eat bland diet next few days. Avoid spicy, fried, oily or acidic (i.e. citrus, tomato, tomato sauce) foods. If you are having diarrhea, avoid dairy products (i.e. milk, butter) until your stool returns to normal. -Take over-the-counter medicines (i.e. Tylenol  Cold and Flu, Nyquil) to help relieve your symptoms. If needed, you may occasionally take Pepto Bismol or Imodium AD for diarrhea. -For your sore throat/cough, use cough drops/throat lozenges, gargle warm salt water and/or drink warm liquids (like tea with honey). -Send MyChart message to provider, call or schedule return visit as needed for new/worsening symptoms (i.e. fever, persistent sinus pain or frequent diarrhea) or if symptoms do not improve as discussed with recommended treatment over the next 5-7 days.

## 2024-06-04 NOTE — Progress Notes (Signed)
 [facility] [address] [place], [place] [address]   Office Visit Note  Patient Name: Antonio Lara Date of Birth [id number]  Medical Record number [id number]  Date of Service: 06/04/2024  Chief Complaint  Patient presents with   Acute Visit    Patient c/o postnasal drainage, flatulence, and diarrhea since yesterday. He did not sleep well last night. He has been spitting up thick yellow-brown mucus. He took Nyquil which was not helpful.     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  Took Zpack about 3 weeks ago for diarrhea/URI sx, had left shift on CBC. Felt back to normal after this.  Thinks congestion/PND/sore throat may have begun about 2 days ago. Sx worse yesterday. Has some sinus pressure. Diarrhea began yesterday AM. Some ringing and popping in ears. Sore throat some worse. Some mild nausea, has not vomited. Had gas overnight after eating cream of potato/onion soup for dinner last night. No diarrhea with this. Had a formed BM this AM. Has had some cough again as well, dry. Had felt a little warm but no fever suspected. No sweats or chills. Perhaps slight HA today. Some body ache today. Denies abdominal pain.  Took Nyquil last night, was not helpful, was up with cough and passing gas.  Wife had some nausea and PND this AM. Son may be sick as well.   Current Medication:  Outpatient Encounter Medications as of 06/04/2024  Medication Sig   azelastine (OPTIVAR) 0.05 % ophthalmic solution Place into both eyes as needed.   esomeprazole  (NEXIUM ) 20 MG capsule TAKE ONE CAPSULE BY MOUTH DAILY   fluticasone (FLONASE) 50 MCG/ACT nasal spray Place into both nostrils as needed.   Fluticasone-Umeclidin-Vilant (TRELEGY ELLIPTA) 200-62.5-25 MCG/ACT AEPB Inhale 1 puff into the lungs daily.   levocetirizine (XYZAL) 5 MG tablet Take 5 mg by mouth every evening.   lisinopril  (ZESTRIL ) 20 MG  tablet Take 1 tablet (20 mg total) by mouth daily.   montelukast  (SINGULAIR ) 10 MG tablet TAKE ONE TABLET BY MOUTH EVERY NIGHT AT BEDTIME   Multiple Vitamin (MULTIVITAMIN) tablet Take 1 tablet by mouth daily.   rosuvastatin  (CRESTOR ) 10 MG tablet TAKE ONE TABLET BY MOUTH ONCE A DAY   VENTOLIN  HFA 108 (90 Base) MCG/ACT inhaler INHALE 2 PUFFS INTO THE LUNGS EVERY 6 HOURS AS NEEDED FOR WHEEZING OR SHORTNESS OF BREATH   No facility-administered encounter medications on file as of 06/04/2024.      Medical History: Past Medical History:  Diagnosis Date   Diabetes mellitus type 2, diet-controlled (HCC) 03/04/2021   GERD (gastroesophageal reflux disease)    Hyperlipidemia LDL goal <70 03/05/2021   Mild obstructive sleep apnea, controlled with dental appliance    Moderate persistent asthma    Sleep apnea      Vital Signs: BP 110/84   Pulse 87   Temp (!) 97 F (36.1 C)   SpO2 97%    Review of Systems  Constitutional:  Positive for fatigue. Negative for chills and fever.  HENT:  Positive for congestion, postnasal drip, sinus pressure and sore throat. Negative for trouble swallowing.   Respiratory:  Positive for cough. Negative for shortness of breath.   Gastrointestinal:  Positive for diarrhea and nausea. Negative for vomiting.  Musculoskeletal:  Positive for myalgias. Negative for neck stiffness.  Neurological:  Positive for headaches.    Physical Exam Vitals reviewed.  Constitutional:      General: He is not  in acute distress.    Comments: Tired appearing  HENT:     Head: Normocephalic.     Right Ear: Ear canal and external ear normal.     Left Ear: Ear canal and external ear normal.     Ears:     Comments: TMs dull bilaterally    Nose: Mucosal edema (L>R) and congestion (mild, L>R) present. No rhinorrhea.     Right Sinus: No maxillary sinus tenderness or frontal sinus tenderness.     Left Sinus: No maxillary sinus tenderness or frontal sinus tenderness.     Mouth/Throat:      Mouth: Mucous membranes are moist. No oral lesions.     Pharynx: Posterior oropharyngeal erythema (mild) present. No pharyngeal swelling or oropharyngeal exudate.     Tonsils: No tonsillar exudate. 0 on the right. 0 on the left.  Cardiovascular:     Rate and Rhythm: Normal rate and regular rhythm.     Heart sounds: No murmur heard.    No friction rub. No gallop.  Pulmonary:     Effort: Pulmonary effort is normal.     Breath sounds: Normal breath sounds. No wheezing, rhonchi or rales.  Musculoskeletal:     Cervical back: Neck supple. No rigidity.  Lymphadenopathy:     Cervical: No cervical adenopathy.  Neurological:     Mental Status: He is alert.     Results for orders placed or performed in visit on 06/04/24 (from the past 24 hours)  POC SOFIA 2 FLU + SARS ANTIGEN FIA     Status: None   Collection Time: 06/04/24 11:37 AM  Result Value Ref Range   Influenza A, POC Negative Negative   Influenza B, POC Negative Negative   SARS Coronavirus 2 Ag Negative Negative     Assessment/Plan: 1. Acute upper respiratory infection (Primary) 2. Diarrhea, unspecified type Suspect viral infection. POC flu/COVID-19 antigen tests negative. Discussed supportive measures and OTC symptomatic treatment. Encouraged to follow up as needed for new/worsening sx (i.e. fever, persistent diarrhea or sinus pain).  - POC SOFIA 2 FLU + SARS ANTIGEN FIA  Patient Instructions  -Rest and stay well-hydrated.  Take frequent sips of water, sports drink or other electrolyte beverage (i.e. Pedialyte, Liquid IV).  Avoid/limit caffeine. Avoid alcohol. -Eat bland diet next few days. Avoid spicy, fried, oily or acidic (i.e. citrus, tomato, tomato sauce) foods. If you are having diarrhea, avoid dairy products (i.e. milk, butter) until your stool returns to normal. -Take over-the-counter medicines (i.e. Tylenol  Cold and Flu, Nyquil) to help relieve your symptoms. If needed, you may occasionally take Pepto Bismol or  Imodium AD for diarrhea. -For your sore throat/cough, use cough drops/throat lozenges, gargle warm salt water and/or drink warm liquids (like tea with honey). -Send MyChart message to provider, call or schedule return visit as needed for new/worsening symptoms (i.e. fever, persistent sinus pain or frequent diarrhea) or if symptoms do not improve as discussed with recommended treatment over the next 5-7 days.      General Counseling: Aadin verbalizes understanding of the findings of todays visit and plan of treatment. he has been encouraged to call the office with any questions or concerns that should arise related to todays visit.    Time spent:20 Minutes    Joen Arts PA-C Physician Assistant

## 2024-06-28 NOTE — Progress Notes (Signed)
 SUVAN STCYR                                          MRN: [id number]   06/28/2024   The [facility] Quality Team Specialist reviewed this patient medical record for the purposes of chart review for care gap closure. The following were reviewed: chart review for care gap closure-glycemic status assessment and kidney health evaluation for diabetes:eGFR  and uACR.    [facility]BCI Quality Team

## 2024-07-04 ENCOUNTER — Encounter: Payer: Self-pay | Admitting: Pharmacist

## 2024-07-04 NOTE — Progress Notes (Signed)
 Pharmacy Quality Measure Review  This patient is appearing on a report for being at risk of failing the Kidney Health Evaluation for Patients with Diabetes measure this calendar year.   Last documented UACR: 2024 Lab Results  Component Value Date   MICRALBCREAT 3 08/11/2023    No upcoming visits.  Annual physical due soon, not scheduled.   No future appointments.

## 2024-08-07 ENCOUNTER — Ambulatory Visit: Admitting: Podiatry

## 2024-08-07 ENCOUNTER — Encounter: Payer: Self-pay | Admitting: Podiatry

## 2024-08-07 ENCOUNTER — Ambulatory Visit (INDEPENDENT_AMBULATORY_CARE_PROVIDER_SITE_OTHER)

## 2024-08-07 ENCOUNTER — Ambulatory Visit

## 2024-08-07 DIAGNOSIS — Q666 Other congenital valgus deformities of feet: Secondary | ICD-10-CM

## 2024-08-07 DIAGNOSIS — M722 Plantar fascial fibromatosis: Secondary | ICD-10-CM

## 2024-08-07 DIAGNOSIS — M205X9 Other deformities of toe(s) (acquired), unspecified foot: Secondary | ICD-10-CM

## 2024-08-07 NOTE — Progress Notes (Signed)
 Subjective:  Patient ID: Antonio Lara, male    DOB: [birth year],  MRN: [id number] HPI Chief Complaint  Patient presents with   Plantar Fasciitis    Interested in new orthotics - last custom orthotics in 2021    48 y.o. male presents with the above complaint.   ROS: Denies fever chills nausea vomiting muscle aches pains calf pain back pain chest pain shortness of breath.  Past Medical History:  Diagnosis Date   Diabetes mellitus type 2, diet-controlled (HCC) 03/04/2021   GERD (gastroesophageal reflux disease)    Hyperlipidemia LDL goal <70 03/05/2021   Mild obstructive sleep apnea, controlled with dental appliance    Moderate persistent asthma    Sleep apnea    Past Surgical History:  Procedure Laterality Date   COLONOSCOPY WITH PROPOFOL  N/A 10/11/2023   Procedure: COLONOSCOPY WITH PROPOFOL ;  Surgeon: Therisa Bi, MD;  Location: [facility] ENDOSCOPY;  Service: Gastroenterology;  Laterality: N/A;   ESOPHAGOGASTRODUODENOSCOPY (EGD) WITH PROPOFOL  N/A 11/18/2022   Procedure: ESOPHAGOGASTRODUODENOSCOPY (EGD) WITH PROPOFOL ;  Surgeon: Therisa Bi, MD;  Location: [facility] ENDOSCOPY;  Service: Gastroenterology;  Laterality: N/A;   POLYPECTOMY  10/11/2023   Procedure: POLYPECTOMY;  Surgeon: Therisa Bi, MD;  Location: [facility] ENDOSCOPY;  Service: Gastroenterology;;   Current Medications[1]  Allergies[2] Review of Systems Objective:  There were no vitals filed for this visit.  General: Well developed, nourished, in no acute distress, alert and oriented x3   Dermatological: Skin is warm, dry and supple bilateral. Nails x 10 are well maintained; remaining integument appears unremarkable at this time. There are no open sores, no preulcerative lesions, no rash or signs of infection present.  Vascular: Dorsalis Pedis artery and Posterior Tibial artery pedal pulses are 2/4 bilateral with immedate capillary fill time. Pedal hair growth present. No varicosities and no lower extremity edema present  bilateral.   Neruologic: Grossly intact via light touch bilateral. Vibratory intact via tuning fork bilateral. Protective threshold with Semmes Wienstein monofilament intact to all pedal sites bilateral. Patellar and Achilles deep tendon reflexes 2+ bilateral. No Babinski or clonus noted bilateral.  Mild hallux limitus with tenderness on end range of motion.  He also has some tenderness on palpation of medial calcaneal tubercle.  This is noted to be bilateral.  He also has mild pes planovalgus bilateral.  Musculoskeletal: No gross boney pedal deformities bilateral. No pain, crepitus, or limitation noted with foot and ankle range of motion bilateral. Muscular strength 5/5 in all groups tested bilateral.  Gait: Unassisted, Nonantalgic.    Radiographs:  Radiographs taken today demonstrate osseously mature individual no open lesions or wounds plantar distally oriented calcaneal heel spur left soft tissue increase in density plantar fascial canny insertion site.  Joint space narrowing first metatarsophalangeal joint bilateral.  Dorsal spurring right foot at the first metatarsal phalangeal joint.    Assessment & Plan:   Assessment: Plantar fasciitis hallux limitus pes planovalgus  Plan: Discussed etiology pathology conservative surgical therapies he was casted for orthotics today.     Malala Trenkamp T. Lihanna Biever, DPM     [1]  Current Outpatient Medications:    azelastine (OPTIVAR) 0.05 % ophthalmic solution, Place into both eyes as needed., Disp: , Rfl:    esomeprazole  (NEXIUM ) 20 MG capsule, TAKE ONE CAPSULE BY MOUTH DAILY, Disp: 90 capsule, Rfl: 3   fluticasone (FLONASE) 50 MCG/ACT nasal spray, Place into both nostrils as needed., Disp: , Rfl:    Fluticasone-Umeclidin-Vilant (TRELEGY ELLIPTA) 200-62.5-25 MCG/ACT AEPB, Inhale 1 puff into the lungs daily., Disp: , Rfl:  levocetirizine (XYZAL) 5 MG tablet, Take 5 mg by mouth every evening., Disp: , Rfl:    lisinopril  (ZESTRIL ) 20 MG tablet, Take 1  tablet (20 mg total) by mouth daily., Disp: 90 tablet, Rfl: 3   montelukast  (SINGULAIR ) 10 MG tablet, TAKE ONE TABLET BY MOUTH EVERY NIGHT AT BEDTIME, Disp: 30 tablet, Rfl: 5   Multiple Vitamin (MULTIVITAMIN) tablet, Take 1 tablet by mouth daily., Disp: , Rfl:    rosuvastatin  (CRESTOR ) 10 MG tablet, TAKE ONE TABLET BY MOUTH ONCE A DAY, Disp: 90 tablet, Rfl: 3   VENTOLIN  HFA 108 (90 Base) MCG/ACT inhaler, INHALE 2 PUFFS INTO THE LUNGS EVERY 6 HOURS AS NEEDED FOR WHEEZING OR SHORTNESS OF BREATH, Disp: 18 g, Rfl: 2 [2]  Allergies Allergen Reactions   Oxycodone-Acetaminophen      REACTION: extemely dizzy with nausea and vomiting.   Zanaflex  [Tizanidine  Hcl] Other (See Comments)    Felt unusual and had some vivid visuals

## 2024-08-17 ENCOUNTER — Telehealth: Admitting: Nurse Practitioner

## 2024-08-17 DIAGNOSIS — J069 Acute upper respiratory infection, unspecified: Secondary | ICD-10-CM | POA: Diagnosis not present

## 2024-08-17 MED ORDER — LIDOCAINE VISCOUS HCL 2 % MT SOLN: 15.0000 mL | OROMUCOSAL | 0 refills | Status: DC | PRN

## 2024-08-17 MED ORDER — IBUPROFEN 600 MG PO TABS: 600.0000 mg | ORAL_TABLET | Freq: Three times a day (TID) | ORAL | 0 refills | Status: DC | PRN

## 2024-08-17 MED ORDER — IPRATROPIUM BROMIDE 0.03 % NA SOLN: 2.0000 | Freq: Two times a day (BID) | NASAL | 0 refills | Status: DC

## 2024-08-17 MED ORDER — PROMETHAZINE-DM 6.25-15 MG/5ML PO SYRP: 5.0000 mL | ORAL_SOLUTION | Freq: Four times a day (QID) | ORAL | 0 refills | Status: DC | PRN

## 2024-08-17 NOTE — Progress Notes (Signed)
 Hello Antonio Lara,  In order to receive Tamiflu you would need to either upload a positive over-the-counter flu test result or confirm exposure to a close contact that is currently being treated and has been diagnosed with influenza.  If not we will currently treat your symptoms and I have prescribed the following below. Steroid nasal spray, motrin  600 mg for pain, fever and body aches, promethazine  cough syrup for cough and viscous lidocaine  for sore throat.  E visit for Flu like symptoms   We are sorry that you are not feeling well.  Here is how we plan to help! Based on what you have shared with me it looks like you may have a respiratory virus that may be influenza.  Influenza or the flu is  an infection caused by a respiratory virus. The flu virus is highly contagious and persons who did not receive their yearly flu vaccination may catch the flu from close contact.  We have anti-viral medications to treat the viruses that cause this infection. They are not a cure and only shorten the course of the infection. These prescriptions are most effective when they are given within the first 2 days of flu symptoms. Antiviral medications are indicated if you have a high risk of complications from the flu. You should  also consider an antiviral medication if you are in close contact with someone who is at risk. These medications can help patients avoid complications from the flu but have side effects that you should know.    For nasal congestion, you may use an oral decongestant such as Mucinex  D or if you have glaucoma or high blood pressure use plain Mucinex .  Saline nasal spray or nasal drops can help and can safely be used as often as needed for congestion.  If you have a sore or scratchy throat, use a saltwater gargle-  to  teaspoon of salt dissolved in a 4-ounce to 8-ounce glass of warm water.  Gargle the solution for approximately 15-30 seconds and then spit.  It is important not to  swallow the solution.  You can also use throat lozenges/cough drops and Chloraseptic spray to help with throat pain or discomfort.  Warm or cold liquids can also be helpful in relieving throat pain.  For headache, pain or general discomfort, you can use Ibuprofen  or Tylenol  as directed.   Some authorities believe that zinc sprays or the use of Echinacea may shorten the course of your symptoms.  You are to isolate at home until you have been fever-free for at least 24 hours without a fever-reducing medication, and symptoms have been steadily improving for 24 hours.  If you must be around other household members who do not have symptoms, you need to make sure that both you and the family members are masking consistently with a high-quality mask.  If you note any worsening of symptoms despite treatment, please seek an in-person evaluation ASAP. If you note any significant shortness of breath or any chest pain, please seek ED evaluation. Please do not delay care!  ANYONE WHO HAS FLU SYMPTOMS SHOULD: Stay home. The flu is highly contagious and going out or to work exposes others! Be sure to drink plenty of fluids. Water is fine as well as fruit juices, sodas and electrolyte beverages. You may want to stay away from caffeine or alcohol. If you are nauseated, try taking small sips of liquids. How do you know if you are getting enough fluid? Your urine should be a pale yellow  or almost colorless. Get rest. Taking a steamy shower or using a humidifier may help nasal congestion and ease sore throat pain. Using a saline nasal spray works much the same way. Cough drops, hard candies and sore throat lozenges may ease your cough. Line up a caregiver. Have someone check on you regularly.  GET HELP RIGHT AWAY IF: You cannot keep down liquids or your medications. You become short of breath Your fell like you are going to pass out or loose consciousness. Your symptoms persist after you have completed your  treatment plan  MAKE SURE YOU  Understand these instructions. Will watch your condition. Will get help right away if you are not doing well or get worse.  Your e-visit answers were reviewed by a board certified advanced clinical practitioner to complete your personal care plan.  Depending on the condition, your plan could have included both over the counter or prescription medications.  If there is a problem please reply  once you have received a response from your provider.  Your safety is important to us .  If you have drug allergies check your prescription carefully.    You can use MyChart to ask questions about todays visit, request a non-urgent call back, or ask for a work or school excuse for 24 hours related to this e-Visit. If it has been greater than 24 hours you will need to follow up with your provider, or enter a new e-Visit to address those concerns.  You will get an e-mail in the next two days asking about your experience.  I hope that your e-visit has been valuable and will speed your recovery. Thank you for using e-visits.   I have spent 5 minutes in review of e-visit questionnaire, review and updating patient chart, medical decision making and response to patient.   Antonio Shreiner W Rafe Mackowski, NP

## 2024-08-20 ENCOUNTER — Encounter: Payer: Self-pay | Admitting: Medical

## 2024-08-20 ENCOUNTER — Ambulatory Visit (INDEPENDENT_AMBULATORY_CARE_PROVIDER_SITE_OTHER): Payer: Self-pay | Admitting: Medical

## 2024-08-20 ENCOUNTER — Other Ambulatory Visit: Payer: Self-pay

## 2024-08-20 VITALS — BP 110/80 | HR 92 | Temp 96.7°F | Ht 72.0 in | Wt 183.0 lb

## 2024-08-20 DIAGNOSIS — R051 Acute cough: Secondary | ICD-10-CM

## 2024-08-20 DIAGNOSIS — J9801 Acute bronchospasm: Secondary | ICD-10-CM

## 2024-08-20 DIAGNOSIS — J069 Acute upper respiratory infection, unspecified: Secondary | ICD-10-CM

## 2024-08-20 DIAGNOSIS — J4531 Mild persistent asthma with (acute) exacerbation: Secondary | ICD-10-CM

## 2024-08-20 LAB — POC SOFIA 2 FLU + SARS ANTIGEN FIA
Influenza A, POC: NEGATIVE
Influenza B, POC: NEGATIVE
SARS Coronavirus 2 Ag: NEGATIVE

## 2024-08-20 MED ORDER — PREDNISONE 10 MG PO TABS: ORAL_TABLET | ORAL | 0 refills | Status: DC

## 2024-08-20 MED ORDER — ALBUTEROL SULFATE HFA 108 (90 BASE) MCG/ACT IN AERS: 2.0000 | INHALATION_SPRAY | RESPIRATORY_TRACT | 2 refills | Status: AC | PRN

## 2024-08-20 NOTE — Progress Notes (Signed)
 [facility] [address] [place], [place] [address]   Office Visit Note  Patient Name: Antonio Lara Date of Birth [id number]  Medical Record number [id number]  Date of Service: 08/20/2024  Chief Complaint  Patient presents with   Sick visit    Patient reports productive cough with brown mucus production and tinnitus. He also had mild SOB last night. Cold symptoms about 5-6 days ago with diarrhea that resolved quickly. He had a telehealth visit and was prescribed cough syrup, nasal spray, and a rescue inhaler which he has not picked up from the pharmacy.  He tested negative for flu/COVID about 4 days ago on an at-home test. He states he is feeling better than he did initially but would like to have his lung sounds evaluated.     HPI Discussed the use of AI scribe software for clinical note transcription with the patient, who gave verbal consent to proceed.  History of Present Illness Antonio Lara is a 48 year old male with asthma who presents with respiratory symptoms and fatigue.  Constitutional symptoms - General malaise and fatigue began December 26, worsening over subsequent days - Significant time spent in bed sweating and attempting to hydrate, especially from Sunday to Monday - Fluctuating course with periods of improvement followed by decline - Mild headache - Missed work due to increased fatigue, nausea, and intermittent diarrhea  Respiratory symptoms - Sore throat onset December 25 - Nasal congestion with clear nasal discharge; multicolored and brown mucus when expelled from throat - Chest tightness, especially when coughing - No shortness of breath or palpitations reported - Tinnitus present, also ear pressure  Gastrointestinal symptoms - Random bout of diarrhea on December 22, resolved without issue - Intermittent diarrhea and nausea since December 26 - Episodes of retching without vomiting - Sensation of 'acid  indigestion'  Asthma and allergic symptoms - History of asthma, uses albuterol  inhaler as needed with mixed relief - Inhaler use increases with chest tightness or difficulty breathing, sometimes up to three or four times daily; Some days, does not use it all. - Asthma symptoms exacerbated by year-round allergies due to pets and environmental allergens - Uses Flonase and azelastine nasal spray as needed for congestion and allergies; Stopped all allergy  meds since he became sick.  Medication use and response - Over-the-counter medications used: Tylenol , DayQuil, and Mucinex ; Also occasional Claritin -D - Tylenol  reduces fever - Mucinex  causes dryness - No medication taken today  Infectious exposure and testing - Exposure to coworkers with flu-like symptoms - At-home COVID and flu test 3 days ago was negative - No flu shot received this season   Current Medication:  Outpatient Encounter Medications as of 08/20/2024  Medication Sig   azelastine (OPTIVAR) 0.05 % ophthalmic solution Place into both eyes as needed.   esomeprazole  (NEXIUM ) 20 MG capsule TAKE ONE CAPSULE BY MOUTH DAILY   fluticasone (FLONASE) 50 MCG/ACT nasal spray Place into both nostrils as needed.   Fluticasone-Umeclidin-Vilant (TRELEGY ELLIPTA) 200-62.5-25 MCG/ACT AEPB Inhale 1 puff into the lungs daily.   ibuprofen  (ADVIL ) 600 MG tablet Take 1 tablet (600 mg total) by mouth every 8 (eight) hours as needed.   levocetirizine (XYZAL) 5 MG tablet Take 5 mg by mouth every evening.   lisinopril  (ZESTRIL ) 20 MG tablet Take 1 tablet (20 mg total) by mouth daily.   montelukast  (SINGULAIR ) 10 MG tablet TAKE ONE TABLET BY MOUTH EVERY NIGHT AT BEDTIME   Multiple Vitamin (MULTIVITAMIN) tablet Take 1 tablet  by mouth daily.   rosuvastatin  (CRESTOR ) 10 MG tablet TAKE ONE TABLET BY MOUTH ONCE A DAY   ipratropium (ATROVENT ) 0.03 % nasal spray Place 2 sprays into both nostrils every 12 (twelve) hours. (Patient not taking: Reported on  08/20/2024)   lidocaine  (XYLOCAINE ) 2 % solution Use as directed 15 mLs in the mouth or throat as needed for mouth pain. (Patient not taking: Reported on 08/20/2024)   promethazine -dextromethorphan (PROMETHAZINE -DM) 6.25-15 MG/5ML syrup Take 5 mLs by mouth 4 (four) times daily as needed. (Patient not taking: Reported on 08/20/2024)   VENTOLIN  HFA 108 (90 Base) MCG/ACT inhaler INHALE 2 PUFFS INTO THE LUNGS EVERY 6 HOURS AS NEEDED FOR WHEEZING OR SHORTNESS OF BREATH (Patient not taking: Reported on 08/20/2024)   No facility-administered encounter medications on file as of 08/20/2024.      Medical History: Past Medical History:  Diagnosis Date   Diabetes mellitus type 2, diet-controlled (HCC) 03/04/2021   GERD (gastroesophageal reflux disease)    Hyperlipidemia LDL goal <70 03/05/2021   Mild obstructive sleep apnea, controlled with dental appliance    Moderate persistent asthma    Sleep apnea      Vital Signs: BP 110/80   Pulse 92   Temp (!) 96.7 F (35.9 C)   Ht 6' (1.829 m)   Wt 183 lb (83 kg)   SpO2 99%   BMI 24.82 kg/m    Review of Systems See HPI  Physical Exam Vitals reviewed.  Constitutional:      General: He is not in acute distress.    Comments: Tired appearing  HENT:     Head: Normocephalic.     Right Ear: Ear canal and external ear normal.     Left Ear: Ear canal and external ear normal.     Ears:     Comments: TMs dull bilaterally; No erythema, injection or bulging to TMs.    Nose: Mucosal edema present. No congestion or rhinorrhea.     Right Sinus: No maxillary sinus tenderness or frontal sinus tenderness.     Left Sinus: No maxillary sinus tenderness or frontal sinus tenderness.     Mouth/Throat:     Mouth: Mucous membranes are moist. No oral lesions.     Pharynx: No pharyngeal swelling or posterior oropharyngeal erythema.     Tonsils: No tonsillar exudate.  Cardiovascular:     Rate and Rhythm: Normal rate and regular rhythm.     Heart sounds: No  murmur heard.    No friction rub. No gallop.  Pulmonary:     Effort: Pulmonary effort is normal. Prolonged expiration present. No respiratory distress.     Breath sounds: Normal breath sounds. No rhonchi or rales.     Comments: Few soft end-expiratory wheezes. Musculoskeletal:     Cervical back: Neck supple. No rigidity.  Lymphadenopathy:     Cervical: Cervical adenopathy (small anterior nodes, slightly tender) present.  Neurological:     Mental Status: He is alert.     Results for orders placed or performed in visit on 08/20/24 (from the past 24 hours)  POC SOFIA 2 FLU + SARS ANTIGEN FIA     Status: None   Collection Time: 08/20/24 10:24 AM  Result Value Ref Range   Influenza A, POC Negative Negative   Influenza B, POC Negative Negative   SARS Coronavirus 2 Ag Negative Negative     Assessment/Plan: 1. Acute upper respiratory infection (Primary) 2. Mild persistent asthma with acute exacerbation 3. Acute cough 4. Bronchospasm  -  predniSONE  (DELTASONE ) 10 MG tablet; Take 6 tablets on day 1, then 5 tablets on day 2, then 4 tablets on day 3, then 3 tablets on day 4, then 2 tablets on day 5 and 1 tablet on day 6. Take all doses with food.  Dispense: 21 tablet; Refill: 0 - albuterol  (VENTOLIN  HFA) 108 (90 Base) MCG/ACT inhaler; Inhale 2 puffs into the lungs every 4 (four) hours as needed for wheezing or shortness of breath.  Dispense: 18 g; Refill: 2 - POC SOFIA 2 FLU + SARS ANTIGEN FIA   Assessment & Plan Mild persistent asthma with acute exacerbation Asthma exacerbation likely due to upper respiratory infection. Albuterol  inhaler provides some relief when used, encouraged more consistent use. Some evidence bronchospasm on exam. Oxygen saturation at 99%. - Use albuterol  inhaler more frequently (every 4--6 hours) for chest tightness, cough, wheezing or difficulty breathing. - Prescribed prednisone  taper for potential use if symptoms worsen or do not improve with increased use of  albuterol  over next 3-4 days. - Resume Flonase for congestion and tinnitus. - Use inhaler before bed to aid sleep.  Acute upper respiratory infection Negative for COVID and flu. Likely viral etiology with symptoms of sore throat, nasal congestion, cough, fatigue, nausea, and intermittent fever. - Use Mucinex  DM during the day for cough. - Consider nighttime cough suppressant like Nyquil or Robitussin nighttime syrup at bedtime. - Continue over-the-counter medications as needed for symptom relief. - Rest and hydrate. - Advised to contact provider or schedule follow-up visit if needed for new/worsening symptoms (i.e. fever, increased shortness of breath) or if symptoms do not improve over next 5-7 days.    General Counseling: Rafi verbalizes understanding of the findings of todays visit and plan of treatment. he has been encouraged to call the office with any questions or concerns that should arise related to todays visit.    Time spent:30 Minutes    Joen Arts PA-C Physician Assistant

## 2024-08-20 NOTE — Patient Instructions (Signed)
-  Rest and stay well hydrated (by drinking water and other liquids).  -Use albuterol  inhaler every 4-6 hours while sick for shortness of breath, wheezing, chest tightness or persistent cough. -If your chest tightness/wheezing/cough do not improve with the albuterol  over the next 3-4 days, you may begin taking the steroid (Prednisone ). -Take over-the-counter medicines (i.e. Mucinex  DM, Nyquil) to help relieve your symptoms. -Resume using Flonase/Fluticasone nasal spray, 2 sprays to each nostril once a day. This may help the ringing/pressure in your ears. -For your sore throat/cough, use cough drops/throat lozenges, gargle warm salt water and/or drink warm liquids (like tea with honey). -Send MyChart message to provider or schedule return visit as needed for new/worsening symptoms (i.e. increased shortness of breath, fever) or if symptoms do not improve as discussed with recommended treatment over the next 5-7 days.

## 2024-08-28 LAB — OPHTHALMOLOGY REPORT-SCANNED

## 2024-09-05 ENCOUNTER — Telehealth: Payer: Self-pay | Admitting: Podiatry

## 2024-09-09 ENCOUNTER — Encounter: Payer: Self-pay | Admitting: Family Medicine

## 2024-09-09 NOTE — Progress Notes (Unsigned)
 "    Antonio Devoss T. Derrian Poli, MD, CAQ Sports Medicine [facility] [address] [place], [address]  Phone: [phone]  FAX: [phone]  Antonio Lara - 49 y.o. male  MRN [id number]  Date of Birth: [birth year]  Date: 09/11/2024  PCP: Antonio Mirza, MD  Referral: Antonio Mirza, MD  No chief complaint on file.  Subjective:   Antonio Lara is a 49 y.o. very pleasant male patient with There is no height or weight on file to calculate BMI. who presents with the following:  Discussed the use of AI scribe software for clinical note transcription with the patient, who gave verbal consent to proceed.  Patient presents shortness of breath and tiredness.  He does asthma at baseline.  Currently uses Trelegy. For allergies, he also uses Xyzal, Optivar, Singulair  History of Present Illness     Review of Systems is noted in the HPI, as appropriate  Objective:   There were no vitals taken for this visit.  GEN: No acute distress; alert,appropriate. PULM: Breathing comfortably in no respiratory distress PSYCH: Normally interactive.   Laboratory and Imaging Data:  Assessment and Plan:   No diagnosis found. Assessment & Plan   Medication Management during today's office visit: No orders of the defined types were placed in this encounter.  There are no discontinued medications.  Orders placed today for conditions managed today: No orders of the defined types were placed in this encounter.   Disposition: No follow-ups on file.  [employer] speech-to-text software was used for transcription in this dictation.  Possible transcriptional errors can occur using Animal nutritionist.   Signed,  Antonio Lara. Antonio Chestnutt, MD   Outpatient Encounter Medications as of 09/11/2024  Medication Sig   albuterol  (VENTOLIN  HFA) 108 (90 Base) MCG/ACT inhaler Inhale 2 puffs into the lungs every 4 (four) hours as needed for wheezing or shortness  of breath.   azelastine (OPTIVAR) 0.05 % ophthalmic solution Place into both eyes as needed.   esomeprazole  (NEXIUM ) 20 MG capsule TAKE ONE CAPSULE BY MOUTH DAILY   fluticasone (FLONASE) 50 MCG/ACT nasal spray Place into both nostrils as needed.   Fluticasone-Umeclidin-Vilant (TRELEGY ELLIPTA) 200-62.5-25 MCG/ACT AEPB Inhale 1 puff into the lungs daily.   ibuprofen  (ADVIL ) 600 MG tablet Take 1 tablet (600 mg total) by mouth every 8 (eight) hours as needed.   ipratropium (ATROVENT ) 0.03 % nasal spray Place 2 sprays into both nostrils every 12 (twelve) hours. (Patient not taking: Reported on 08/20/2024)   levocetirizine (XYZAL) 5 MG tablet Take 5 mg by mouth every evening.   lidocaine  (XYLOCAINE ) 2 % solution Use as directed 15 mLs in the mouth or throat as needed for mouth pain. (Patient not taking: Reported on 08/20/2024)   lisinopril  (ZESTRIL ) 20 MG tablet Take 1 tablet (20 mg total) by mouth daily.   montelukast  (SINGULAIR ) 10 MG tablet TAKE ONE TABLET BY MOUTH EVERY NIGHT AT BEDTIME   Multiple Vitamin (MULTIVITAMIN) tablet Take 1 tablet by mouth daily.   predniSONE  (DELTASONE ) 10 MG tablet Take 6 tablets on day 1, then 5 tablets on day 2, then 4 tablets on day 3, then 3 tablets on day 4, then 2 tablets on day 5 and 1 tablet on day 6. Take all doses with food.   promethazine -dextromethorphan (PROMETHAZINE -DM) 6.25-15 MG/5ML syrup Take 5 mLs by mouth 4 (four) times daily as needed. (Patient not taking: Reported on 08/20/2024)   rosuvastatin  (CRESTOR ) 10 MG tablet TAKE ONE TABLET BY MOUTH  ONCE A DAY   No facility-administered encounter medications on file as of 09/11/2024.   "

## 2024-09-11 ENCOUNTER — Encounter: Payer: Self-pay | Admitting: Family Medicine

## 2024-09-11 ENCOUNTER — Ambulatory Visit: Admitting: Family Medicine

## 2024-09-11 ENCOUNTER — Ambulatory Visit: Payer: Self-pay | Admitting: Family Medicine

## 2024-09-11 VITALS — BP 120/100 | HR 97 | Temp 98.0°F | Ht 72.0 in | Wt 191.0 lb

## 2024-09-11 DIAGNOSIS — E785 Hyperlipidemia, unspecified: Secondary | ICD-10-CM

## 2024-09-11 DIAGNOSIS — J208 Acute bronchitis due to other specified organisms: Secondary | ICD-10-CM | POA: Diagnosis not present

## 2024-09-11 DIAGNOSIS — Z79899 Other long term (current) drug therapy: Secondary | ICD-10-CM | POA: Diagnosis not present

## 2024-09-11 DIAGNOSIS — J4541 Moderate persistent asthma with (acute) exacerbation: Secondary | ICD-10-CM | POA: Diagnosis not present

## 2024-09-11 DIAGNOSIS — Z125 Encounter for screening for malignant neoplasm of prostate: Secondary | ICD-10-CM | POA: Diagnosis not present

## 2024-09-11 DIAGNOSIS — E119 Type 2 diabetes mellitus without complications: Secondary | ICD-10-CM | POA: Diagnosis not present

## 2024-09-11 LAB — CBC WITH DIFFERENTIAL/PLATELET
Basophils Absolute: 0 K/uL (ref 0.0–0.1)
Basophils Relative: 0.6 % (ref 0.0–3.0)
Eosinophils Absolute: 0.2 K/uL (ref 0.0–0.7)
Eosinophils Relative: 2.1 % (ref 0.0–5.0)
HCT: 43.2 % (ref 39.0–52.0)
Hemoglobin: 14.6 g/dL (ref 13.0–17.0)
Lymphocytes Relative: 23.3 % (ref 12.0–46.0)
Lymphs Abs: 1.7 K/uL (ref 0.7–4.0)
MCHC: 33.9 g/dL (ref 30.0–36.0)
MCV: 85.3 fl (ref 78.0–100.0)
Monocytes Absolute: 0.6 K/uL (ref 0.1–1.0)
Monocytes Relative: 8.4 % (ref 3.0–12.0)
Neutro Abs: 4.9 K/uL (ref 1.4–7.7)
Neutrophils Relative %: 65.6 % (ref 43.0–77.0)
Platelets: 246 K/uL (ref 150.0–400.0)
RBC: 5.06 Mil/uL (ref 4.22–5.81)
RDW: 13.3 % (ref 11.5–15.5)
WBC: 7.5 K/uL (ref 4.0–10.5)

## 2024-09-11 LAB — HEMOGLOBIN A1C: Hgb A1c MFr Bld: 7.2 % — ABNORMAL HIGH (ref 4.6–6.5)

## 2024-09-11 LAB — BASIC METABOLIC PANEL WITH GFR
BUN: 13 mg/dL (ref 6–23)
CO2: 32 meq/L (ref 19–32)
Calcium: 9.8 mg/dL (ref 8.4–10.5)
Chloride: 103 meq/L (ref 96–112)
Creatinine, Ser: 1.03 mg/dL (ref 0.40–1.50)
GFR: 86.95 mL/min
Glucose, Bld: 159 mg/dL — ABNORMAL HIGH (ref 70–99)
Potassium: 4.1 meq/L (ref 3.5–5.1)
Sodium: 141 meq/L (ref 135–145)

## 2024-09-11 LAB — LIPID PANEL
Cholesterol: 198 mg/dL (ref 28–200)
HDL: 72.6 mg/dL
LDL Cholesterol: 104 mg/dL — ABNORMAL HIGH (ref 10–99)
NonHDL: 125.16
Total CHOL/HDL Ratio: 3
Triglycerides: 108 mg/dL (ref 10.0–149.0)
VLDL: 21.6 mg/dL (ref 0.0–40.0)

## 2024-09-11 LAB — HEPATIC FUNCTION PANEL
ALT: 52 U/L (ref 3–53)
AST: 30 U/L (ref 5–37)
Albumin: 5 g/dL (ref 3.5–5.2)
Alkaline Phosphatase: 85 U/L (ref 39–117)
Bilirubin, Direct: 0.1 mg/dL (ref 0.1–0.3)
Total Bilirubin: 0.5 mg/dL (ref 0.2–1.2)
Total Protein: 7.5 g/dL (ref 6.0–8.3)

## 2024-09-11 LAB — TSH: TSH: 1.68 u[IU]/mL (ref 0.35–5.50)

## 2024-09-11 LAB — MICROALBUMIN / CREATININE URINE RATIO
Creatinine,U: 269.3 mg/dL
Microalb Creat Ratio: 6.7 mg/g (ref 0.0–30.0)
Microalb, Ur: 1.8 mg/dL (ref 0.7–1.9)

## 2024-09-11 MED ORDER — DOXYCYCLINE HYCLATE 100 MG PO TABS: 100.0000 mg | ORAL_TABLET | Freq: Two times a day (BID) | ORAL | 0 refills | Status: DC

## 2024-09-13 LAB — PSA, TOTAL WITH REFLEX TO PSA, FREE: PSA, Total: 1.1 ng/mL

## 2024-09-20 ENCOUNTER — Other Ambulatory Visit

## 2024-09-25 ENCOUNTER — Encounter: Payer: Self-pay | Admitting: Family Medicine

## 2024-09-25 ENCOUNTER — Ambulatory Visit: Admitting: Family Medicine

## 2024-09-25 VITALS — BP 110/74 | HR 64 | Temp 97.7°F | Ht 72.5 in | Wt 192.0 lb

## 2024-09-25 DIAGNOSIS — Z23 Encounter for immunization: Secondary | ICD-10-CM

## 2024-09-25 DIAGNOSIS — Z0000 Encounter for general adult medical examination without abnormal findings: Secondary | ICD-10-CM | POA: Diagnosis not present

## 2024-09-25 MED ORDER — LISINOPRIL 10 MG PO TABS: 20.0000 mg | ORAL_TABLET | Freq: Every day | ORAL | 1 refills | Status: AC

## 2024-09-25 NOTE — Patient Instructions (Signed)
Decrease lisinopril to 10mg a day.
